# Patient Record
Sex: Male | Born: 1990 | Race: White | Hispanic: No | Marital: Single | Smoking: Current every day smoker
Health system: Southern US, Community
[De-identification: ages and names within clinical notes are randomized; demographics above are authoritative.]

## PROBLEM LIST (undated history)

## (undated) DIAGNOSIS — E119 Type 2 diabetes mellitus without complications: Secondary | ICD-10-CM

## (undated) DIAGNOSIS — L0291 Cutaneous abscess, unspecified: Secondary | ICD-10-CM

## (undated) HISTORY — PX: DENTAL SURGERY: SHX609

## (undated) HISTORY — PX: TONSILLECTOMY: SUR1361

---

## 2016-12-19 ENCOUNTER — Inpatient Hospital Stay (HOSPITAL_COMMUNITY)
Admission: EM | Admit: 2016-12-19 | Discharge: 2017-01-06 | DRG: 871 | Disposition: A | Discharge status: Skilled Nursing Facility | Payer: Self-pay | Attending: Internal Medicine | Admitting: Internal Medicine

## 2016-12-19 ENCOUNTER — Encounter (HOSPITAL_COMMUNITY): Payer: Self-pay | Admitting: Emergency Medicine

## 2016-12-19 DIAGNOSIS — E222 Syndrome of inappropriate secretion of antidiuretic hormone: Secondary | ICD-10-CM | POA: Diagnosis present

## 2016-12-19 DIAGNOSIS — D649 Anemia, unspecified: Secondary | ICD-10-CM | POA: Diagnosis present

## 2016-12-19 DIAGNOSIS — Z91030 Bee allergy status: Secondary | ICD-10-CM

## 2016-12-19 DIAGNOSIS — R52 Pain, unspecified: Secondary | ICD-10-CM

## 2016-12-19 DIAGNOSIS — R918 Other nonspecific abnormal finding of lung field: Secondary | ICD-10-CM | POA: Diagnosis present

## 2016-12-19 DIAGNOSIS — Z9689 Presence of other specified functional implants: Secondary | ICD-10-CM

## 2016-12-19 DIAGNOSIS — Z4682 Encounter for fitting and adjustment of non-vascular catheter: Secondary | ICD-10-CM

## 2016-12-19 DIAGNOSIS — Z91040 Latex allergy status: Secondary | ICD-10-CM

## 2016-12-19 DIAGNOSIS — A4102 Sepsis due to Methicillin resistant Staphylococcus aureus: Principal | ICD-10-CM

## 2016-12-19 DIAGNOSIS — F1990 Other psychoactive substance use, unspecified, uncomplicated: Secondary | ICD-10-CM | POA: Diagnosis present

## 2016-12-19 DIAGNOSIS — F17210 Nicotine dependence, cigarettes, uncomplicated: Secondary | ICD-10-CM | POA: Diagnosis present

## 2016-12-19 DIAGNOSIS — IMO0001 Reserved for inherently not codable concepts without codable children: Secondary | ICD-10-CM

## 2016-12-19 DIAGNOSIS — J90 Pleural effusion, not elsewhere classified: Secondary | ICD-10-CM | POA: Diagnosis present

## 2016-12-19 DIAGNOSIS — Z09 Encounter for follow-up examination after completed treatment for conditions other than malignant neoplasm: Secondary | ICD-10-CM

## 2016-12-19 DIAGNOSIS — I2690 Septic pulmonary embolism without acute cor pulmonale: Secondary | ICD-10-CM | POA: Diagnosis present

## 2016-12-19 DIAGNOSIS — F1910 Other psychoactive substance abuse, uncomplicated: Secondary | ICD-10-CM | POA: Diagnosis present

## 2016-12-19 DIAGNOSIS — E1110 Type 2 diabetes mellitus with ketoacidosis without coma: Secondary | ICD-10-CM | POA: Diagnosis present

## 2016-12-19 DIAGNOSIS — D638 Anemia in other chronic diseases classified elsewhere: Secondary | ICD-10-CM | POA: Diagnosis present

## 2016-12-19 DIAGNOSIS — Z6833 Body mass index (BMI) 33.0-33.9, adult: Secondary | ICD-10-CM

## 2016-12-19 DIAGNOSIS — E876 Hypokalemia: Secondary | ICD-10-CM | POA: Diagnosis not present

## 2016-12-19 DIAGNOSIS — Z888 Allergy status to other drugs, medicaments and biological substances status: Secondary | ICD-10-CM

## 2016-12-19 DIAGNOSIS — J189 Pneumonia, unspecified organism: Secondary | ICD-10-CM | POA: Diagnosis present

## 2016-12-19 DIAGNOSIS — L02214 Cutaneous abscess of groin: Secondary | ICD-10-CM | POA: Diagnosis present

## 2016-12-19 DIAGNOSIS — N151 Renal and perinephric abscess: Secondary | ICD-10-CM | POA: Diagnosis present

## 2016-12-19 DIAGNOSIS — J869 Pyothorax without fistula: Secondary | ICD-10-CM | POA: Diagnosis present

## 2016-12-19 DIAGNOSIS — Y95 Nosocomial condition: Secondary | ICD-10-CM | POA: Diagnosis present

## 2016-12-19 DIAGNOSIS — L02212 Cutaneous abscess of back [any part, except buttock]: Secondary | ICD-10-CM | POA: Diagnosis present

## 2016-12-19 DIAGNOSIS — M4624 Osteomyelitis of vertebra, thoracic region: Secondary | ICD-10-CM | POA: Diagnosis present

## 2016-12-19 DIAGNOSIS — B999 Unspecified infectious disease: Secondary | ICD-10-CM

## 2016-12-19 DIAGNOSIS — M4640 Discitis, unspecified, site unspecified: Secondary | ICD-10-CM | POA: Diagnosis present

## 2016-12-19 DIAGNOSIS — R509 Fever, unspecified: Secondary | ICD-10-CM

## 2016-12-19 DIAGNOSIS — Z79899 Other long term (current) drug therapy: Secondary | ICD-10-CM

## 2016-12-19 DIAGNOSIS — J9811 Atelectasis: Secondary | ICD-10-CM | POA: Diagnosis present

## 2016-12-19 DIAGNOSIS — N10 Acute tubulo-interstitial nephritis: Secondary | ICD-10-CM

## 2016-12-19 DIAGNOSIS — K5900 Constipation, unspecified: Secondary | ICD-10-CM | POA: Diagnosis not present

## 2016-12-19 DIAGNOSIS — Z794 Long term (current) use of insulin: Secondary | ICD-10-CM

## 2016-12-19 DIAGNOSIS — G061 Intraspinal abscess and granuloma: Secondary | ICD-10-CM | POA: Diagnosis present

## 2016-12-19 DIAGNOSIS — M549 Dorsalgia, unspecified: Secondary | ICD-10-CM

## 2016-12-19 DIAGNOSIS — A419 Sepsis, unspecified organism: Secondary | ICD-10-CM | POA: Diagnosis present

## 2016-12-19 HISTORY — DX: Cutaneous abscess, unspecified: L02.91

## 2016-12-19 HISTORY — DX: Type 2 diabetes mellitus without complications: E11.9

## 2016-12-19 MED ORDER — HYDROMORPHONE HCL 1 MG/ML IJ SOLN
1.0000 mg | Freq: Once | INTRAMUSCULAR | Status: AC
Start: 2016-12-20 — End: 2016-12-20
  Administered 2016-12-20: 1 mg via INTRAVENOUS
  Filled 2016-12-19: qty 1

## 2016-12-19 MED ORDER — SODIUM CHLORIDE 0.9 % IV SOLN: Freq: Once | INTRAVENOUS | Status: DC

## 2016-12-19 NOTE — ED Triage Notes (Signed)
BIB [facility] as tx from [facility]. Pt reports L sided back pain that has worsened over past 2 weeks with N/V, dec in appetite and trouble controlling sugar. Pt had 2 groin abscesses drained in OR (1 two days and and one this AM) Pt sent here for R/O epidural abscess, needs to have MRI done.

## 2016-12-19 NOTE — ED Provider Notes (Addendum)
[facility]-EMERGENCY DEPT Provider Note   CSN: [id number] Arrival date & time: 12/19/16  2313   By signing my name below, I, [name], attest that this documentation has been prepared under the direction and in the presence of Azalia Bilis, MD. Electronically signed, [name], ED Scribe. 12/19/16. 11:58 PM.   History   Chief Complaint Chief Complaint  Patient presents with  . Back Pain   The history is provided by the patient and medical records. No language interpreter was used.    Keith Riggs is a 26 y.o. male with h/o IDDM presenting to the Emergency Department concerning gradually worsening severe back pain x 2 weeks. Some L sided chest wall pain worse with coughing, loss of appetite x 2-3 days, chills and diaphoresis also noted. Pt allegedly had an abscess drained on the L thigh this morning and another drained on the R thigh 2 days ago. Concern has been expressed that his glucose levels continue to climb over the time that he has experienced loss of appetite. Pt taking 40 units of insulin and 500 mg metFormin regularly at home. He states he last used IV drugs 2 months ago. No known medication allergies. No SOB, vomiting, diarrhea, fever or headache noted. No other complaints at this time.    Pt sent from [facility] for MRI of the spine and admission. Pt given vancomycin and zosyn at [facility] 28[facility]  Past Medical History:  Diagnosis Date  . Abscess   . Diabetes mellitus without complication (HCC)     There are no active problems to display for this patient.   Past Surgical History:  Procedure Laterality Date  . DENTAL SURGERY    . TONSILLECTOMY         Home Medications    Prior to Admission medications   Medication Sig Start Date End Date Taking? Authorizing Provider  cephALEXin (KEFLEX) 500 MG capsule Take 500 mg by mouth 4 (four) times daily. 12/15/16  Yes [provider]  HYDROcodone-acetaminophen (NORCO/VICODIN) 5-325 MG tablet  Take 1 tablet by mouth every 4 (four) hours as needed. for pain 12/17/16  Yes [provider]  Insulin Glargine (TOUJEO MAX SOLOSTAR) 300 UNIT/ML SOPN Inject 40 Units into the skin daily.   Yes [provider]  metFORMIN (GLUCOPHAGE) 500 MG tablet Take 500 mg by mouth 2 (two) times daily with a meal.   Yes [provider]    Family History No family history on file.  Social History Social History  Substance Use Topics  . Smoking status: Current Every Day Smoker    Types: Cigarettes  . Smokeless tobacco: Not on file  . Alcohol use Yes     Allergies   Bee venom; Voltaren [diclofenac sodium]; and Latex   Review of Systems Review of Systems  Constitutional: Positive for appetite change, chills and diaphoresis. Negative for fever.  Respiratory: Negative for shortness of breath.   Gastrointestinal: Negative for diarrhea, nausea and vomiting.  Musculoskeletal: Positive for back pain.  Neurological: Negative for headaches.  All other systems reviewed and are negative.    Physical Exam Updated Vital Signs BP 124/70 (BP Location: Left Arm)   Pulse 94   Temp 98 F (36.7 C) (Oral)   Resp (!) 28   Ht 5\' 11"  (1.803 m)   Wt 237 lb (107.5 kg)   SpO2 99%   BMI 33.05 kg/m   Physical Exam  Constitutional: He is oriented to person, place, and time. He appears well-developed and well-nourished.  HENT:  Head: Normocephalic and atraumatic.  Eyes: EOM are normal.  Neck: Normal range of motion.  Cardiovascular: Normal rate, regular rhythm and normal heart sounds.   Pulmonary/Chest: Effort normal and breath sounds normal. No respiratory distress.  Abdominal: Soft. He exhibits no distension. There is no tenderness.  Musculoskeletal: Normal range of motion.  Midthoracic swelling and skin discoloration without drainage.  Palpable mass.  Normal strength in lower extremities  Neurological: He is alert and oriented to person, place, and time.  Skin: Skin is warm  and dry.  Psychiatric: He has a normal mood and affect. Judgment normal.  Nursing note and vitals reviewed.    ED Treatments / Results  DIAGNOSTIC STUDIES: Oxygen Saturation is 99% on RA, NL by my interpretation.    COORDINATION OF CARE: 11:51 PM-Discussed next steps with pt. Pt verbalized understanding and is agreeable with the plan. Will review imaging and prepare for admission.   Labs (all labs ordered are listed, but only abnormal results are displayed) Labs Reviewed - No data to display  EKG  EKG Interpretation None       Radiology No results found.  ++++++++++++++++++++++++++++++++++++++++++++++++  Procedures .Critical Care Performed by: Azalia BilisAMPOS, Conroy Goracke Authorized by: Azalia BilisAMPOS, Aidin Doane    Total critical care time: 33 minutes Critical care time was exclusive of separately billable procedures and treating other patients. Critical care was necessary to treat or prevent imminent or life-threatening deterioration. Critical care was time spent personally by me on the following activities: development of treatment plan with patient and/or surrogate as well as nursing, discussions with consultants, evaluation of patient's response to treatment, examination of patient, obtaining history from patient or surrogate, ordering and performing treatments and interventions, ordering and review of laboratory studies, ordering and review of radiographic studies, pulse oximetry and re-evaluation of patient's condition.  +++++++++++++++++++++++++++++++++++++++++++++++++++  Medications Ordered in ED Medications  0.9 %  sodium chloride infusion ( Intravenous Restarted 12/20/16 0507)  HYDROmorphone (DILAUDID) injection 0.5-1 mg (1 mg Intravenous Given 12/20/16 0213)  vancomycin (VANCOCIN) IVPB 1000 mg/200 mL premix (0 mg Intravenous Stopped 12/20/16 0316)  piperacillin-tazobactam (ZOSYN) IVPB 3.375 g (3.375 g Intravenous New Bag/Given 12/20/16 0527)  0.9 %  sodium chloride infusion ( Intravenous  Stopped 12/20/16 0325)  acetaminophen (TYLENOL) tablet 650 mg (not administered)  ondansetron (ZOFRAN) injection 4 mg (not administered)  insulin glargine (LANTUS) injection 20 Units (not administered)  insulin aspart (novoLOG) injection 0-9 Units (not administered)  HYDROmorphone (DILAUDID) injection 1 mg (1 mg Intravenous Given 12/20/16 0038)  gadobenate dimeglumine (MULTIHANCE) injection 20 mL (20 mLs Intravenous Contrast Given 12/20/16 0445)  HYDROmorphone (DILAUDID) injection 0.5 mg (0.5 mg Intravenous Given 12/20/16 0520)     Initial Impression / Assessment and Plan / ED Course  I have reviewed the triage vital signs and the nursing notes.  Pertinent labs & imaging results that were available during my care of the patient were reviewed by me and considered in my medical decision making (see chart for details).     Outside records reviewed.  White count 28,000.  Patient with pneumonia on chest x-ray.  Given broad-spectrum antibiotics including Bactrim and Zosyn.  Blood cultures obtained.  Repeat CBG in repeat blood cultures now.  Nothing by mouth.  Patient will undergo thoracic MRI.  We will plan to admit the patient to the hospitalist service.  Case discussed with Dr. Julian ReilGardner  5:29 AM Patient does have an epidural abscess.  I discussed the case with neurosurgery will evaluate the patient at bedside.  Patient will remain nothing  by mouth at this time.  Patient also does have paraspinal abscesses and a questionable empyema.  He'll undergo CT chest abdomen and pelvis for further evaluation.  Dr. Julian Reil was also made aware as he is the admitting hospitalist.  Final Clinical Impressions(s) / ED Diagnoses   Final diagnoses:  Back pain  Fever  Sepsis, due to unspecified organism (HCC)  HCAP (healthcare-associated pneumonia)    New Prescriptions New Prescriptions   No medications on file   I personally performed the services described in this documentation, which was scribed in my  presence. The recorded information has been reviewed and is accurate.        Azalia Bilis, MD 12/20/16 Ivor Reining    Azalia Bilis, MD 12/20/16 [phone]

## 2016-12-20 ENCOUNTER — Inpatient Hospital Stay (HOSPITAL_COMMUNITY): Payer: Self-pay

## 2016-12-20 ENCOUNTER — Encounter (HOSPITAL_COMMUNITY): Payer: Self-pay

## 2016-12-20 ENCOUNTER — Inpatient Hospital Stay (HOSPITAL_COMMUNITY): Payer: Medicaid - Out of State

## 2016-12-20 DIAGNOSIS — F1910 Other psychoactive substance abuse, uncomplicated: Secondary | ICD-10-CM

## 2016-12-20 DIAGNOSIS — A419 Sepsis, unspecified organism: Secondary | ICD-10-CM

## 2016-12-20 DIAGNOSIS — D649 Anemia, unspecified: Secondary | ICD-10-CM | POA: Diagnosis present

## 2016-12-20 DIAGNOSIS — Z794 Long term (current) use of insulin: Secondary | ICD-10-CM

## 2016-12-20 DIAGNOSIS — N10 Acute tubulo-interstitial nephritis: Secondary | ICD-10-CM

## 2016-12-20 DIAGNOSIS — E119 Type 2 diabetes mellitus without complications: Secondary | ICD-10-CM

## 2016-12-20 DIAGNOSIS — J189 Pneumonia, unspecified organism: Secondary | ICD-10-CM

## 2016-12-20 DIAGNOSIS — L02214 Cutaneous abscess of groin: Secondary | ICD-10-CM

## 2016-12-20 DIAGNOSIS — F1990 Other psychoactive substance use, unspecified, uncomplicated: Secondary | ICD-10-CM

## 2016-12-20 DIAGNOSIS — J90 Pleural effusion, not elsewhere classified: Secondary | ICD-10-CM

## 2016-12-20 DIAGNOSIS — L02212 Cutaneous abscess of back [any part, except buttock]: Secondary | ICD-10-CM | POA: Diagnosis present

## 2016-12-20 DIAGNOSIS — M4624 Osteomyelitis of vertebra, thoracic region: Secondary | ICD-10-CM

## 2016-12-20 DIAGNOSIS — G061 Intraspinal abscess and granuloma: Secondary | ICD-10-CM

## 2016-12-20 DIAGNOSIS — Z91040 Latex allergy status: Secondary | ICD-10-CM

## 2016-12-20 DIAGNOSIS — J181 Lobar pneumonia, unspecified organism: Secondary | ICD-10-CM

## 2016-12-20 DIAGNOSIS — F17210 Nicotine dependence, cigarettes, uncomplicated: Secondary | ICD-10-CM | POA: Diagnosis present

## 2016-12-20 DIAGNOSIS — R7881 Bacteremia: Secondary | ICD-10-CM

## 2016-12-20 DIAGNOSIS — M546 Pain in thoracic spine: Secondary | ICD-10-CM

## 2016-12-20 DIAGNOSIS — R918 Other nonspecific abnormal finding of lung field: Secondary | ICD-10-CM | POA: Diagnosis present

## 2016-12-20 DIAGNOSIS — Z888 Allergy status to other drugs, medicaments and biological substances status: Secondary | ICD-10-CM

## 2016-12-20 DIAGNOSIS — E1110 Type 2 diabetes mellitus with ketoacidosis without coma: Secondary | ICD-10-CM

## 2016-12-20 DIAGNOSIS — IMO0001 Reserved for inherently not codable concepts without codable children: Secondary | ICD-10-CM

## 2016-12-20 DIAGNOSIS — Z91030 Bee allergy status: Secondary | ICD-10-CM

## 2016-12-20 HISTORY — DX: Cutaneous abscess of groin: L02.214

## 2016-12-20 HISTORY — DX: Other psychoactive substance abuse, uncomplicated: F19.10

## 2016-12-20 HISTORY — DX: Osteomyelitis of vertebra, thoracic region: M46.24

## 2016-12-20 HISTORY — DX: Intraspinal abscess and granuloma: G06.1

## 2016-12-20 LAB — BASIC METABOLIC PANEL
ANION GAP: 12 (ref 5–15)
Anion gap: 10 (ref 5–15)
Anion gap: 10 (ref 5–15)
Anion gap: 9 (ref 5–15)
BUN: 5 mg/dL — AB (ref 6–20)
BUN: 7 mg/dL (ref 6–20)
BUN: 8 mg/dL (ref 6–20)
CALCIUM: 7.7 mg/dL — AB (ref 8.9–10.3)
CHLORIDE: 95 mmol/L — AB (ref 101–111)
CO2: 20 mmol/L — AB (ref 22–32)
CO2: 20 mmol/L — AB (ref 22–32)
CO2: 21 mmol/L — ABNORMAL LOW (ref 22–32)
CO2: 25 mmol/L (ref 22–32)
CREATININE: 0.55 mg/dL — AB (ref 0.61–1.24)
CREATININE: 0.56 mg/dL — AB (ref 0.61–1.24)
CREATININE: 0.63 mg/dL (ref 0.61–1.24)
Calcium: 7.7 mg/dL — ABNORMAL LOW (ref 8.9–10.3)
Calcium: 7.8 mg/dL — ABNORMAL LOW (ref 8.9–10.3)
Calcium: 7.8 mg/dL — ABNORMAL LOW (ref 8.9–10.3)
Chloride: 95 mmol/L — ABNORMAL LOW (ref 101–111)
Chloride: 95 mmol/L — ABNORMAL LOW (ref 101–111)
Chloride: 93 mmol/L — ABNORMAL LOW (ref 101–111)
Creatinine, Ser: 0.64 mg/dL (ref 0.61–1.24)
GFR calc Af Amer: >60 mL/min (ref >60)
GFR calc Af Amer: >60 mL/min (ref >60)
GFR calc non Af Amer: >60 mL/min (ref >60)
GFR calc non Af Amer: >60 mL/min (ref >60)
GLUCOSE: 195 mg/dL — AB (ref 65–99)
GLUCOSE: 199 mg/dL — AB (ref 65–99)
Glucose, Bld: 191 mg/dL — ABNORMAL HIGH (ref 65–99)
Glucose, Bld: 250 mg/dL — ABNORMAL HIGH (ref 65–99)
POTASSIUM: 3.5 mmol/L (ref 3.5–5.1)
Potassium: 3.2 mmol/L — ABNORMAL LOW (ref 3.5–5.1)
Potassium: 3.6 mmol/L (ref 3.5–5.1)
Potassium: 3.6 mmol/L (ref 3.5–5.1)
SODIUM: 126 mmol/L — AB (ref 135–145)
SODIUM: 127 mmol/L — AB (ref 135–145)
Sodium: 125 mmol/L — ABNORMAL LOW (ref 135–145)
Sodium: 127 mmol/L — ABNORMAL LOW (ref 135–145)

## 2016-12-20 LAB — FERRITIN: FERRITIN: 1249 ng/mL — AB (ref 24–336)

## 2016-12-20 LAB — CBC
HEMATOCRIT: 31.6 % — AB (ref 39.0–52.0)
HEMOGLOBIN: 10.3 g/dL — AB (ref 13.0–17.0)
MCH: 26.8 pg (ref 26.0–34.0)
MCHC: 32.6 g/dL (ref 30.0–36.0)
MCV: 82.3 fL (ref 78.0–100.0)
Platelets: 529 10*3/uL — ABNORMAL HIGH (ref 150–400)
RBC: 3.84 MIL/uL — ABNORMAL LOW (ref 4.22–5.81)
RDW: 13.9 % (ref 11.5–15.5)
WBC: 26.5 10*3/uL — ABNORMAL HIGH (ref 4.0–10.5)

## 2016-12-20 LAB — ECHOCARDIOGRAM COMPLETE
Height: 71 in
Weight: 3792 oz

## 2016-12-20 LAB — CBG MONITORING, ED
GLUCOSE-CAPILLARY: 186 mg/dL — AB (ref 65–99)
GLUCOSE-CAPILLARY: 193 mg/dL — AB (ref 65–99)
Glucose-Capillary: 183 mg/dL — ABNORMAL HIGH (ref 65–99)
Glucose-Capillary: 175 mg/dL — ABNORMAL HIGH (ref 65–99)

## 2016-12-20 LAB — RETICULOCYTES
RBC.: 3.95 MIL/uL — ABNORMAL LOW (ref 4.22–5.81)
RETIC COUNT ABSOLUTE: 27.7 10*3/uL (ref 19.0–186.0)
RETIC CT PCT: 0.7 % (ref 0.4–3.1)

## 2016-12-20 LAB — GLUCOSE, CAPILLARY
Glucose-Capillary: 172 mg/dL — ABNORMAL HIGH (ref 65–99)
Glucose-Capillary: 222 mg/dL — ABNORMAL HIGH (ref 65–99)

## 2016-12-20 LAB — HIV ANTIBODY (ROUTINE TESTING W REFLEX): HIV SCREEN 4TH GENERATION: NONREACTIVE

## 2016-12-20 LAB — IRON AND TIBC
IRON: 17 ug/dL — AB (ref 45–182)
Saturation Ratios: 13 % — ABNORMAL LOW (ref 17.9–39.5)
TIBC: 132 ug/dL — ABNORMAL LOW (ref 250–450)
UIBC: 115 ug/dL

## 2016-12-20 LAB — STREP PNEUMONIAE URINARY ANTIGEN: STREP PNEUMO URINARY ANTIGEN: NEGATIVE

## 2016-12-20 LAB — PROTEIN, TOTAL: TOTAL PROTEIN: 5.9 g/dL — AB (ref 6.5–8.1)

## 2016-12-20 MED ORDER — ONDANSETRON HCL 4 MG/2ML IJ SOLN: 4.0000 mg | Freq: Four times a day (QID) | INTRAMUSCULAR | Status: DC | PRN

## 2016-12-20 MED ORDER — SODIUM CHLORIDE 0.9 % IV SOLN: INTRAVENOUS | Status: DC

## 2016-12-20 MED ORDER — INSULIN ASPART 100 UNIT/ML ~~LOC~~ SOLN
0.0000 [IU] | SUBCUTANEOUS | Status: DC
  Administered 2016-12-20 (×3): 2 [IU] via SUBCUTANEOUS
  Filled 2016-12-20 (×3): qty 1

## 2016-12-20 MED ORDER — PIPERACILLIN-TAZOBACTAM 3.375 G IVPB: 3.3750 g | Freq: Three times a day (TID) | INTRAVENOUS | Status: DC

## 2016-12-20 MED ORDER — FENTANYL CITRATE (PF) 100 MCG/2ML IJ SOLN
INTRAMUSCULAR | Status: AC
  Filled 2016-12-20: qty 4

## 2016-12-20 MED ORDER — MIDAZOLAM HCL 2 MG/2ML IJ SOLN
INTRAMUSCULAR | Status: AC | PRN
  Administered 2016-12-20 (×2): 1 mg via INTRAVENOUS

## 2016-12-20 MED ORDER — DEXTROSE-NACL 5-0.45 % IV SOLN: INTRAVENOUS | Status: DC

## 2016-12-20 MED ORDER — SODIUM CHLORIDE 0.9 % IV SOLN
INTRAVENOUS | Status: DC
  Filled 2016-12-20: qty 1

## 2016-12-20 MED ORDER — HYDROMORPHONE HCL 1 MG/ML IJ SOLN
0.5000 mg | INTRAMUSCULAR | Status: DC | PRN
  Administered 2016-12-20 – 2016-12-21 (×8): 1 mg via INTRAVENOUS
  Administered 2016-12-21: 0.5 mg via INTRAVENOUS
  Administered 2016-12-22: 1 mg via INTRAVENOUS
  Administered 2016-12-22: 0.5 mg via INTRAVENOUS
  Administered 2016-12-22 – 2016-12-23 (×6): 1 mg via INTRAVENOUS
  Administered 2016-12-24: 0.5 mg via INTRAVENOUS
  Administered 2016-12-24 (×3): 1 mg via INTRAVENOUS
  Administered 2016-12-24: 0.5 mg via INTRAVENOUS
  Administered 2016-12-25 (×2): 1 mg via INTRAVENOUS
  Filled 2016-12-20 (×4): qty 1
  Filled 2016-12-20: qty 0.5
  Filled 2016-12-20 (×8): qty 1
  Filled 2016-12-20 (×2): qty 0.5
  Filled 2016-12-20 (×3): qty 1
  Filled 2016-12-20: qty 0.5
  Filled 2016-12-20 (×6): qty 1

## 2016-12-20 MED ORDER — FENTANYL CITRATE (PF) 100 MCG/2ML IJ SOLN
INTRAMUSCULAR | Status: AC | PRN
  Administered 2016-12-20 (×2): 50 ug via INTRAVENOUS

## 2016-12-20 MED ORDER — SODIUM CHLORIDE 0.9 % IV SOLN
INTRAVENOUS | Status: DC
  Administered 2016-12-20 (×2): via INTRAVENOUS
  Administered 2016-12-20: 125 mL/h via INTRAVENOUS

## 2016-12-20 MED ORDER — IOPAMIDOL (ISOVUE-300) INJECTION 61%
INTRAVENOUS | Status: AC
Start: 2016-12-20 — End: 2016-12-20
  Administered 2016-12-20: 100 mL
  Filled 2016-12-20: qty 75

## 2016-12-20 MED ORDER — OXYCODONE HCL 5 MG PO TABS
5.0000 mg | ORAL_TABLET | ORAL | Status: DC | PRN
  Administered 2016-12-20 – 2016-12-21 (×3): 5 mg via ORAL
  Filled 2016-12-20 (×4): qty 1

## 2016-12-20 MED ORDER — INSULIN ASPART 100 UNIT/ML ~~LOC~~ SOLN: 0.0000 [IU] | SUBCUTANEOUS | Status: DC

## 2016-12-20 MED ORDER — SODIUM CHLORIDE 0.9 % IV SOLN
INTRAVENOUS | Status: AC
  Administered 2016-12-20: 02:00:00 via INTRAVENOUS

## 2016-12-20 MED ORDER — ACETAMINOPHEN 325 MG PO TABS
650.0000 mg | ORAL_TABLET | Freq: Four times a day (QID) | ORAL | Status: DC | PRN
  Administered 2016-12-20 – 2016-12-26 (×4): 650 mg via ORAL
  Filled 2016-12-20 (×4): qty 2

## 2016-12-20 MED ORDER — INSULIN ASPART 100 UNIT/ML ~~LOC~~ SOLN: 0.0000 [IU] | Freq: Three times a day (TID) | SUBCUTANEOUS | Status: DC

## 2016-12-20 MED ORDER — INSULIN GLARGINE 100 UNIT/ML ~~LOC~~ SOLN
20.0000 [IU] | Freq: Every day | SUBCUTANEOUS | Status: DC
  Administered 2016-12-20: 20 [IU] via SUBCUTANEOUS
  Filled 2016-12-20 (×3): qty 0.2

## 2016-12-20 MED ORDER — LIDOCAINE HCL (PF) 1 % IJ SOLN
INTRAMUSCULAR | Status: AC
  Filled 2016-12-20: qty 30

## 2016-12-20 MED ORDER — HYDROMORPHONE HCL 1 MG/ML IJ SOLN
0.5000 mg | Freq: Once | INTRAMUSCULAR | Status: AC
  Administered 2016-12-20: 0.5 mg via INTRAVENOUS
  Filled 2016-12-20: qty 0.5

## 2016-12-20 MED ORDER — VANCOMYCIN HCL IN DEXTROSE 1-5 GM/200ML-% IV SOLN
1000.0000 mg | Freq: Three times a day (TID) | INTRAVENOUS | Status: DC
  Administered 2016-12-20 – 2016-12-21 (×5): 1000 mg via INTRAVENOUS
  Filled 2016-12-20 (×6): qty 200

## 2016-12-20 MED ORDER — INSULIN GLARGINE 100 UNIT/ML ~~LOC~~ SOLN
25.0000 [IU] | Freq: Every day | SUBCUTANEOUS | Status: DC
  Filled 2016-12-20: qty 0.25

## 2016-12-20 MED ORDER — GADOBENATE DIMEGLUMINE 529 MG/ML IV SOLN
20.0000 mL | Freq: Once | INTRAVENOUS | Status: AC
  Administered 2016-12-20: 20 mL via INTRAVENOUS

## 2016-12-20 MED ORDER — DEXTROSE 5 % IV SOLN
2.0000 g | INTRAVENOUS | Status: DC
  Administered 2016-12-20 – 2016-12-22 (×3): 2 g via INTRAVENOUS
  Filled 2016-12-20 (×3): qty 2

## 2016-12-20 MED ORDER — MIDAZOLAM HCL 2 MG/2ML IJ SOLN
INTRAMUSCULAR | Status: AC
  Filled 2016-12-20: qty 4

## 2016-12-20 MED ORDER — INSULIN ASPART 100 UNIT/ML ~~LOC~~ SOLN
0.0000 [IU] | SUBCUTANEOUS | Status: DC
  Administered 2016-12-20: 3 [IU] via SUBCUTANEOUS
  Administered 2016-12-21 (×2): 5 [IU] via SUBCUTANEOUS
  Administered 2016-12-21: 3 [IU] via SUBCUTANEOUS
  Administered 2016-12-21: 5 [IU] via SUBCUTANEOUS
  Administered 2016-12-21 (×2): 8 [IU] via SUBCUTANEOUS
  Administered 2016-12-22: 5 [IU] via SUBCUTANEOUS
  Administered 2016-12-22: 8 [IU] via SUBCUTANEOUS
  Administered 2016-12-22: 5 [IU] via SUBCUTANEOUS
  Administered 2016-12-22 (×2): 3 [IU] via SUBCUTANEOUS
  Administered 2016-12-22 – 2016-12-23 (×2): 8 [IU] via SUBCUTANEOUS
  Administered 2016-12-23: 3 [IU] via SUBCUTANEOUS
  Administered 2016-12-23: 5 [IU] via SUBCUTANEOUS
  Administered 2016-12-23: 8 [IU] via SUBCUTANEOUS
  Administered 2016-12-23: 5 [IU] via SUBCUTANEOUS
  Administered 2016-12-23: 2 [IU] via SUBCUTANEOUS
  Administered 2016-12-24: 5 [IU] via SUBCUTANEOUS
  Administered 2016-12-24: 3 [IU] via SUBCUTANEOUS
  Administered 2016-12-25: 6 [IU] via SUBCUTANEOUS
  Administered 2016-12-25: 11 [IU] via SUBCUTANEOUS
  Administered 2016-12-25: 3 [IU] via SUBCUTANEOUS

## 2016-12-20 MED ORDER — PIPERACILLIN-TAZOBACTAM 3.375 G IVPB
3.3750 g | Freq: Three times a day (TID) | INTRAVENOUS | Status: DC
  Administered 2016-12-20: 3.375 g via INTRAVENOUS
  Filled 2016-12-20: qty 50

## 2016-12-20 NOTE — Consult Note (Signed)
[facility]    Date of Admission:  12/19/2016   Total days of antibiotics 4        Day 1 vancomycin        Day 1 piperacillin tazobactam               Reason for Consult: Widely disseminated infection with thoracic osteomyelitis, epidural abscess, cavitary lung nodules, probable pleural empyemas, and multiple soft tissue infection    Referring Provider: Dr. Calvert Cantor  Assessment: He has widely disseminated infection with thoracic osteomyelitis complicated by large epidural abscess causing cord compression. I suspect that he has right-sided endocarditis with septic pulmonary emboli. This is most likely due to staph aureus but I will continue broad coverage pending culture results. I will continue vancomycin for now. He does not need broader coverage for resistant gram-negative rods and anaerobes. I will narrow piperacillin tazobactam to ceftriaxone.   Plan: 1. Continue vancomycin 2. Change piperacillin tazobactam to ceftriaxone 3. Await results of blood cultures 4. Transthoracic echocardiogram   Principal Problem:   Osteomyelitis of thoracic spine (HCC) Active Problems:   Multiple lung nodules   Sepsis (HCC)   Abscess of back   Pleural effusion, bilateral   Abscess in epidural space of thoracic spine   Soft tissue abscess of inguinal region   DM2 (diabetes mellitus, type 2) (HCC)   IVDU (intravenous drug user)   DKA (diabetic ketoacidoses) (HCC)   Polysubstance abuse   Cigarette smoker   Normocytic anemia   . insulin aspart  0-9 Units Subcutaneous Q4H  . insulin glargine  20 Units Subcutaneous Daily    HPI: Keith Riggs is a 26 y.o. male with diabetes mellitus and a history of active intravenous drug use. He recently developed bilateral groin abscesses, fever, chills and mid back pain. His right groin abscess was incised and drained 3 days ago. I cannot locate any culture results. He was started on empiric cephalexin. He was seen  back in the ED at [facility] for wound check yesterday and released. He returned to the ED there because of progressively severe back pain and was transferred here. 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 There are also multiple paraspinous abscesses There are bilateral cavitary lung nodules and loculated pleural effusions left greater than right. Abdominal CT scan shows evidence of bilateral pyelonephritis, a right perinephric abscess and left buttock abscess. Blood cultures done yesterday at [facility] are negative so far.   Review of Systems: Review of Systems  Constitutional: Positive for chills, diaphoresis, fever and malaise/fatigue. Negative for weight loss.  HENT: Negative for congestion and sore throat.   Respiratory: Positive for cough and shortness of breath. Negative for sputum production.   Cardiovascular: Positive for chest pain.  Gastrointestinal: Positive for nausea. Negative for abdominal pain, diarrhea and vomiting.  Genitourinary: Negative for dysuria.  Musculoskeletal: Positive for back pain.  Skin: Negative for rash.  Neurological: Negative for sensory change, focal weakness and headaches.  Psychiatric/Behavioral: Positive for substance abuse.    Past Medical History:  Diagnosis Date  . Abscess   . Diabetes mellitus without complication [facility])     Social History  Substance Use Topics  . Smoking status: Current Every Day Smoker    Types: Cigarettes  . Smokeless tobacco: Not on file  . Alcohol use Yes    No family history on file. Allergies  Allergen Reactions  . Bee Venom Anaphylaxis  .  Voltaren [Diclofenac Sodium] Other (See Comments)    "Makes me feel not myself"  . Latex Rash    OBJECTIVE: Blood pressure (!) 126/59, pulse (!) 118, temperature 98.5 F (36.9 C), temperature source Oral, resp. rate (!) 30, height 5\' 11"  (1.803 m), weight 237 lb (107.5 kg), SpO2 91 %.  Physical Exam  Constitutional:  He is oriented to person, place, and time.  He is uncomfortable due to back pain. His grandmother is at the bedside.  HENT:  Mouth/Throat: No oropharyngeal exudate.  Eyes: Conjunctivae are normal.  Neck: Neck supple.  Cardiovascular: Regular rhythm.   No murmur heard. He is tachycardic.  Pulmonary/Chest: Effort normal and breath sounds normal. He has no wheezes. He has no rales.  Abdominal: Soft. He exhibits no distension. There is no tenderness.  Genitourinary:  Genitourinary Comments: Bilateral groin abscesses with bloody purulent drainage.  Musculoskeletal: Normal range of motion. He exhibits no edema or tenderness.  Abscess with swelling over her mid spine.  Neurological: He is alert and oriented to person, place, and time.  Skin: No rash noted.  Multiple tattoos.  Psychiatric: Mood and affect normal.    Lab Results Lab Results  Component Value Date   WBC 26.5 (H) 12/20/2016   HGB 10.3 (L) 12/20/2016   HCT 31.6 (L) 12/20/2016   MCV 82.3 12/20/2016   PLT 529 (H) 12/20/2016    Lab Results  Component Value Date   CREATININE 0.64 12/20/2016   BUN 5 (L) 12/20/2016   NA 127 (L) 12/20/2016   K 3.6 12/20/2016   CL 95 (L) 12/20/2016   CO2 20 (L) 12/20/2016   No results found for: ALT, AST, GGT, ALKPHOS, BILITOT   Microbiology: No results found for this or any previous visit (from the past 240 hour(s)).  Cliffton AstersJohn Mirra Basilio, MD [facility] [phone] pager   [phone] cell 12/20/2016, 12:01 PM

## 2016-12-20 NOTE — Progress Notes (Addendum)
PROGRESS NOTE    Keith Riggs   ZOX:[id number]  DOB: 06/05/1991  DOA: 12/19/2016 PCP: System, Pcp Not In   Brief Narrative:  26 y/o with IDDM, past history of heroin abuse, cocaine abuse who get random drug tests 2 x a month. He is a transfer from [facility] ER. History obtained from Grandmother and father whom he lives with.  He was seen there for a right groin abscess on Wed which was I and D'd. He was seen again on Friday for a packing of the abscess and was found to have a left groin abscess with was I and D'd. He returned to there ER in the after noon because he thought his packing had fallen out. At that time, he complained of back pain and was sent to [facility] for and MRI. This showed thoracic epidural abscesses causing severe spinal cord compression, paraspinal myositis and abscesses, T6 osteo and b/l pleural effusions.   CT chest abd pelvis: 1. Multiple cavitary nodules in both lungs, compatible with septic emboli. 2. Moderate-sized multiloculated left pleural effusion. The multiple loculations are compatible with empyema formation. 3. Previously described epidural and left paraspinal abscesses. 4. Bilateral lung atelectasis, greater on the left. 5. Soft tissue gas in the inferior, medial left buttock region, compatible with the patient's known groin abscesses. 6. Evidence of bilateral pyelonephritis with a small right perinephric abscess or small flattened exophytic cyst. 7. Minimal mediastinal adenopathy, most likely reactive.    Subjective: Complains of being thirsty and having some back pain. Mainly focused on wanting to eat and drink and poor historian.  No other complaints.   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  - CT surgery consulted- not a VATS candidate at this time- recommended IR consult for pigtail catheter- IR consulted and drain placed - ID consulted>> Vanc and Rocephin per ID - stat 2 D ECHO negative for vegetation   Active Problems:  Hyponatremia - cont normal saline infusion    DM2 (diabetes mellitus, type 2) - cont Lantus and SSI    IVDU (intravenous drug user) - has used IV Cocaine and Heroin in the past - states he has not used in 2-3 months - grandmother states he get random drug tests 2 x month - check UDS   Cigarette smoker - Nicotine patch    Normocytic anemia - anemia panel in AM   DVT prophylaxis: holding anticoagulation for possible neursurgery Code Status: Full code Family Communication: father and grandmother Disposition Plan: SDU Consultants:   ID  CT surgery  Neurosurgery  IR Procedures:   CT guided left chest tube Antimicrobials:  Anti-infectives    Start     Dose/Rate Route Frequency Ordered Stop   12/20/16 1300  piperacillin-tazobactam (ZOSYN) IVPB 3.375 g  Status:  Discontinued     3.375 g 12.5 mL/hr over 240 Minutes Intravenous Every 8 hours 12/20/16 1109 12/20/16 1214   12/20/16 1300  cefTRIAXone (ROCEPHIN) 2 g in dextrose 5 %  50 mL IVPB     2 g 100 mL/hr over 30 Minutes Intravenous Every 24 hours 12/20/16 1214     12/20/16 0200  vancomycin (VANCOCIN) IVPB 1000 mg/200 mL premix     1,000 mg 200 mL/hr over 60 Minutes Intravenous Every 8 hours 12/20/16 0024     12/20/16 0200  piperacillin-tazobactam (ZOSYN) IVPB 3.375 g  Status:  Discontinued     3.375 g 12.5 mL/hr over 240 Minutes Intravenous Every 8 hours 12/20/16 0024 12/20/16  1109       Objective: Vitals:   12/20/16 1145 12/20/16 1200 12/20/16 1215 12/20/16 1230  BP: 138/69 (!) 142/85 136/78 124/68  Pulse: (!) 117 (!) 115 (!) 120 (!) 120  Resp: (!) 37 (!) 37 (!) 38 (!) 28  Temp:      TempSrc:      SpO2: 93% 93% 93% 92%  Weight:      Height:        Intake/Output Summary (Last 24 hours) at 12/20/16 1302 Last data filed at 12/20/16 0326  Gross per 24 hour  Intake             2400 ml  Output              900 ml  Net             1500 ml   Filed Weights   12/19/16 2324  Weight: 107.5 kg (237 lb)    Examination: General exam: Appears comfortable  HEENT: PERRLA, oral mucosa moist, no sclera icterus or thrush Respiratory system: Clear to auscultation. RR in mid 20s on my exam.  Cardiovascular system: S1 & S2 heard, RRR.  No murmurs  Gastrointestinal system: Abdomen soft, non-tender, nondistended. Normal bowel sound. No organomegaly Central nervous system: Alert and oriented. No focal neurological deficits. Extremities: No cyanosis, clubbing or edema Skin: No rashes or ulcers Psychiatry:  Mood & affect appropriate.     Data Reviewed: I have personally reviewed following labs and imaging studies  CBC:  Recent Labs Lab 12/20/16 0011  WBC 26.5*  HGB 10.3*  HCT 31.6*  MCV 82.3  PLT 529*   Basic Metabolic Panel:  Recent Labs Lab 12/20/16 0011 12/20/16 0043 12/20/16 0909  NA 125* 126* 127*  K 3.6 3.5 3.6  CL 95* 95* 95*  CO2 20* 21* 20*  GLUCOSE 199* 191* 195*  BUN 7 8 5*  CREATININE 0.56* 0.63 0.64  CALCIUM 7.7* 7.8* 7.7*   GFR: Estimated Creatinine Clearance: 171.5 mL/min (by C-G formula based on SCr of 0.64 mg/dL). Liver Function Tests: No results for input(s): AST, ALT, ALKPHOS, BILITOT, PROT, ALBUMIN in the last 168 hours. No results for input(s): LIPASE, AMYLASE in the last 168 hours. No results for input(s): AMMONIA in the last 168 hours. Coagulation Profile: No results for input(s): INR, PROTIME in the last 168  hours. Cardiac Enzymes: No results for input(s): CKTOTAL, CKMB, CKMBINDEX, TROPONINI in the last 168 hours. BNP (last 3 results) No results for input(s): PROBNP in the last 8760 hours. HbA1C: No results for input(s): HGBA1C in the last 72 hours. CBG:  Recent Labs Lab 12/20/16 0051 12/20/16 0532 12/20/16 0858 12/20/16 1209  GLUCAP 193* 186* 183* 175*   Lipid Profile: No results for input(s): CHOL, HDL, LDLCALC, TRIG, CHOLHDL, LDLDIRECT in the last 72 hours. Thyroid Function Tests: No results for input(s): TSH, T4TOTAL, FREET4, T3FREE, THYROIDAB in the last 72 hours. Anemia Panel: No results for input(s): VITAMINB12, FOLATE, FERRITIN, TIBC, IRON, RETICCTPCT in the  last 72 hours. Urine analysis: No results found for: COLORURINE, APPEARANCEUR, LABSPEC, PHURINE, GLUCOSEU, HGBUR, BILIRUBINUR, KETONESUR, PROTEINUR, UROBILINOGEN, NITRITE, LEUKOCYTESUR Sepsis Labs: @LABRCNTIP (procalcitonin:4,lacticidven:4) )No results found for this or any previous visit (from the past 240 hour(s)).       Radiology Studies: Ct Chest W Contrast  Result Date: 12/20/2016 CLINICAL DATA:  Worsening left back pain over the past 2 weeks with nausea and vomiting. Thoracic spine MR earlier today demonstrating multifocal epidural abscess with paraspinal myositis and paraspinal muscle abscesses and bilateral pleural empyema with probable pneumonia and possible lung abscesses. Chest CT was recommended. 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 EXAM: CT CHEST, ABDOMEN, AND PELVIS WITH CONTRAST TECHNIQUE: Multidetector CT imaging of the chest, abdomen and pelvis was performed following the standard protocol during bolus administration of intravenous contrast. CONTRAST:  ISOVUE-300 IOPAMIDOL (ISOVUE-300) INJECTION 61% COMPARISON:  Thoracic spine MR obtained earlier today. FINDINGS: CT CHEST FINDINGS Cardiovascular: No significant vascular findings. Normal heart size. No pericardial effusion.  Mediastinum/Nodes: Prominent AP window lymph node with a short axis diameter of 11 mm on image number 27 of series 3. Mildly prominent precarinal node with a short axis diameter of 8 mm on image number 46 of series 3. Lungs/Pleura: Large number of bilateral lung nodules, most numerous on the right. Some of these demonstrate central cavitation. The largest on the left is at the medial left lung apex, measuring 1.7 cm in maximum diameter on image number 39 of series 4. The largest on the right is in the posterior aspect of the right upper lobe, with a maximum diameter of 1.6 cm on image number 53 of series 4. Small right pleural effusion and mild associated right lower lobe atelectasis. Moderate-sized multiloculated left pleural effusion and moderate compressive and dependent atelectasis. Musculoskeletal: Previously described left paraspinal muscle abscess. This is largest on image number 32 of series 3, measuring 3.9 x 2.3 cm on that image. There is a more discrete abscess with rim enhancement on the left on image number 36 of series 3, measuring 2.0 x 1.4 cm. The previously demonstrated multifocal epidural abscess is better visualized on the MR images. No bone destruction seen. Mild thoracic and lower cervical spine degenerative changes. CT ABDOMEN PELVIS FINDINGS Hepatobiliary: No focal liver abnormality is seen. No gallstones, gallbladder wall thickening, or biliary dilatation. Pancreas: Unremarkable. No pancreatic ductal dilatation or surrounding inflammatory changes. Spleen: Normal in size without focal abnormality. Adrenals/Urinary Tract: Both kidneys are mildly enlarged with poorly defined areas low density. There is a very small medial right perinephric fluid collection measuring 1.9 x 1.1 cm on image number 76 series 3. Minimal bilateral perinephric soft tissue stranding. Unremarkable ureters, urinary bladder and adrenal glands. No hydronephrosis. Stomach/Bowel: Stomach is within normal limits. Appendix  appears normal. No evidence of bowel wall thickening, distention, or inflammatory changes. Vascular/Lymphatic: No significant vascular findings are present. No enlarged abdominal or pelvic lymph nodes. Reproductive: Prostate is unremarkable. Other: Very small bilateral inguinal hernias containing fat. Musculoskeletal: The left paraspinal muscle abscesses extend to the level of the upper abdomen. The largest portion of the abscess at that level measures 4.3 x 2.7 cm on image number 58 of series 3. No bone destruction is seen. An L1 vertebral body hemangioma is incidentally noted. There is soft tissue gas in the inferior, medial left buttock region subcutaneous fat on the most inferior images with no fluid collection seen on those images. IMPRESSION: 1. Multiple cavitary nodules in both lungs, compatible with septic emboli. 2. Moderate-sized  multiloculated left pleural effusion. The multiple loculations are compatible with empyema formation. 3. Previously described epidural and left paraspinal abscesses. 4. Bilateral lung atelectasis, greater on the left. 5. Soft tissue gas in the inferior, medial left buttock region, compatible with the patient's known groin abscesses. 6. Evidence of bilateral pyelonephritis with a small right perinephric abscess or small flattened exophytic cyst. 7. Minimal mediastinal adenopathy, most likely reactive. Electronically Signed   By: Beckie Salts M.D.   On: 12/20/2016 09:11   Mr Thoracic Spine W Wo Contrast  Result Date: 12/20/2016 CLINICAL DATA:  Severe mid back pain for 2 weeks. History of diabetes , intravenous drug use and recent groin abscess. EXAM: MRI THORACIC WITHOUT AND WITH CONTRAST TECHNIQUE: Multiplanar and multiecho pulse sequences of the thoracic spine were obtained without and with intravenous contrast. CONTRAST:  20mL MULTIHANCE GADOBENATE DIMEGLUMINE 529 MG/ML IV SOLN COMPARISON:  Thoracic spine radiograph December 15, 2016 FINDINGS: Generally poor signal to noise ratio  due to habitus and motion. The axial sequences of the lower thoracic spine are nondiagnostic. ALIGNMENT: Straightened thoracic kyphosis. No malalignment. VERTEBRAE/DISCS: Vertebral bodies are intact. Intervertebral discs morphology and signal are normal. Faintly increased STIR signal and enhancement T6 vertebral body. No abnormal disc enhancement. CORD: 1.7 x 1.3 x 7.8 cm (transverse by AP by CC) ventral epidural abscess from T4-5 through T8-9 resulting in severe cord compression T5-6 through T7 nondiagnostic examination for spinal cord edema. No syrinx. LEFT ventral epidural abscess from T2 to T5-6, 7.1 cm in cranial caudad dimension resulting in mild cord compression. PREVERTEBRAL AND PARASPINAL SOFT TISSUES: Partially imaged lobulated, potentially loculated LEFT pleural effusion. Patchy LEFT lung consolidation. T2 bright signal within the bilateral paraspinal muscles compatible with mild bursitis. Complex 4.5 x 2.5 x 13.8 cm LEFT paraspinal muscle abscess from T5 through T12. 0.8 x 0.8 x 8.3 cm RIGHT paraspinal muscle abscess at the level of the epidural collection. Prevertebral subpleural abscesses bilaterally measuring to at least 1.4 x 2.4 cm on the LEFT. IMPRESSION: 1. Limited examination, nondiagnostic evaluation through the lower thoracic spine. 2. 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 No syrinx. 3. 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 4. Paraspinal myositis with paraspinal muscle abscesses, LEFT greater than RIGHT. 5. Bilateral pleural empyema and, probable pneumonia. Lung abscess is possible. Recommend CT chest with contrast. Critical Value/emergent results were called by telephone at the time of interpretation on 12/20/2016 at 5:21 am to Dr. Azalia Bilis, ED, who verbally acknowledged these results. Electronically Signed   By: Awilda Metro M.D.   On: 12/20/2016 05:25   Ct Abdomen Pelvis W  Contrast  Result Date: 12/20/2016 CLINICAL DATA:  Worsening left back pain over the past 2 weeks with nausea and vomiting. Thoracic spine MR earlier today demonstrating multifocal epidural abscess with paraspinal myositis and paraspinal muscle abscesses and bilateral pleural empyema with probable pneumonia and possible lung abscesses. Chest CT was recommended. 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 EXAM: CT CHEST, ABDOMEN, AND PELVIS WITH CONTRAST TECHNIQUE: Multidetector CT imaging of the chest, abdomen and pelvis was performed following the standard protocol during bolus administration of intravenous contrast. CONTRAST:  ISOVUE-300 IOPAMIDOL (ISOVUE-300) INJECTION 61% COMPARISON:  Thoracic spine MR obtained earlier today. FINDINGS: CT CHEST FINDINGS Cardiovascular: No significant vascular findings. Normal heart size. No pericardial effusion. Mediastinum/Nodes: Prominent AP window lymph node with a short axis diameter of 11 mm on image number 27  of series 3. Mildly prominent precarinal node with a short axis diameter of 8 mm on image number 46 of series 3. Lungs/Pleura: Large number of bilateral lung nodules, most numerous on the right. Some of these demonstrate central cavitation. The largest on the left is at the medial left lung apex, measuring 1.7 cm in maximum diameter on image number 39 of series 4. The largest on the right is in the posterior aspect of the right upper lobe, with a maximum diameter of 1.6 cm on image number 53 of series 4. Small right pleural effusion and mild associated right lower lobe atelectasis. Moderate-sized multiloculated left pleural effusion and moderate compressive and dependent atelectasis. Musculoskeletal: Previously described left paraspinal muscle abscess. This is largest on image number 32 of series 3, measuring 3.9 x 2.3 cm on that image. There is a more discrete abscess with rim enhancement on the left on image number 36 of series 3, measuring  2.0 x 1.4 cm. The previously demonstrated multifocal epidural abscess is better visualized on the MR images. No bone destruction seen. Mild thoracic and lower cervical spine degenerative changes. CT ABDOMEN PELVIS FINDINGS Hepatobiliary: No focal liver abnormality is seen. No gallstones, gallbladder wall thickening, or biliary dilatation. Pancreas: Unremarkable. No pancreatic ductal dilatation or surrounding inflammatory changes. Spleen: Normal in size without focal abnormality. Adrenals/Urinary Tract: Both kidneys are mildly enlarged with poorly defined areas low density. There is a very small medial right perinephric fluid collection measuring 1.9 x 1.1 cm on image number 76 series 3. Minimal bilateral perinephric soft tissue stranding. Unremarkable ureters, urinary bladder and adrenal glands. No hydronephrosis. Stomach/Bowel: Stomach is within normal limits. Appendix appears normal. No evidence of bowel wall thickening, distention, or inflammatory changes. Vascular/Lymphatic: No significant vascular findings are present. No enlarged abdominal or pelvic lymph nodes. Reproductive: Prostate is unremarkable. Other: Very small bilateral inguinal hernias containing fat. Musculoskeletal: The left paraspinal muscle abscesses extend to the level of the upper abdomen. The largest portion of the abscess at that level measures 4.3 x 2.7 cm on image number 58 of series 3. No bone destruction is seen. An L1 vertebral body hemangioma is incidentally noted. There is soft tissue gas in the inferior, medial left buttock region subcutaneous fat on the most inferior images with no fluid collection seen on those images. IMPRESSION: 1. Multiple cavitary nodules in both lungs, compatible with septic emboli. 2. Moderate-sized multiloculated left pleural effusion. The multiple loculations are compatible with empyema formation. 3. Previously described epidural and left paraspinal abscesses. 4. Bilateral lung atelectasis, greater on the  left. 5. Soft tissue gas in the inferior, medial left buttock region, compatible with the patient's known groin abscesses. 6. Evidence of bilateral pyelonephritis with a small right perinephric abscess or small flattened exophytic cyst. 7. Minimal mediastinal adenopathy, most likely reactive. Electronically Signed   By: Beckie Salts M.D.   On: 12/20/2016 09:11      Scheduled Meds: . insulin aspart  0-9 Units Subcutaneous Q4H  . insulin glargine  20 Units Subcutaneous Daily   Continuous Infusions: . sodium chloride 125 mL/hr (12/20/16 1254)  . cefTRIAXone (ROCEPHIN)  IV 2 g (12/20/16 1300)  . vancomycin Stopped (12/20/16 1153)     LOS: 0 days    Time spent in minutes: 60 min at minimal in intervals from 7:30 AM until now 5: 25 PM - greater than 50 % of time spent speaking with patient, father grandmother, multiple nurses, the physician who admitting him, reviewing chart from [facility], calling consults and  following up imaging and labs.     Calvert Cantor, MD [facility] Pager: [email or web address] Password [email or web address] 12/20/2016, 1:02 PM

## 2016-12-20 NOTE — H&P (Signed)
History and Physical    Keith Riggs ZOX:[id number] DOB: 12/11/1990 DOA: 12/19/2016  PCP: No primary care provider on file.  Patient coming from: [facility]  I have personally briefly reviewed patient's old medical records [facility]  Chief Complaint: Back pain  HPI: Keith Riggs is a 26 y.o. male with medical history significant of IDDM, IVDU, cocaine most recently has done heroin in past.  Patient recently had 2 groin abscesses drained in hospital (one 2 days ago and one yesterday).  He also has been having severe back pain across mid back for the past 2 weeks.  Worsening.  Also L sided chest pain with cough.  Loss of appetite x2-3 days.  States last IVDU was 2 months ago.   ED Course: [facility] he is septic with WBC 28k, initial HR 139, Tm 39.x, CXR shows LLL PNA, possibly LUL PNA too, has firm tender knot / abscess on back, [facility].  BGL in the 200s, but patient has >80 keytones in urine and anion gap of 20.  Bicarb is 20, and Lactate is only 1.x.  Given zosyn, vanc, and [facility].   Review of Systems: As per HPI otherwise 10 point review of systems negative.   Past Medical History:  Diagnosis Date  . Abscess   . Diabetes mellitus without complication [facility](HCC)     Past Surgical History:  Procedure Laterality Date  . DENTAL SURGERY    . TONSILLECTOMY       reports that he has been smoking Cigarettes.  He does not have any smokeless tobacco history on file. He reports that he drinks alcohol. He reports that he uses drugs, including IV.  Allergies  Allergen Reactions  . Bee Venom Anaphylaxis  . Voltaren [Diclofenac Sodium] Other (See Comments)    "Makes me feel not myself"  . Latex Rash    No family history on file.   Prior to Admission medications   Medication Sig Start Date End Date Taking? Authorizing Provider  cephALEXin (KEFLEX) 500 MG capsule Take 500 mg by mouth 4 (four) times daily. 12/15/16  Yes [provider]  HYDROcodone-acetaminophen (NORCO/VICODIN) 5-325 MG tablet Take 1 tablet by mouth every 4 (four) hours as needed. for pain 12/17/16  Yes [provider]  Insulin Glargine (TOUJEO MAX SOLOSTAR) 300 UNIT/ML SOPN Inject 40 Units into the skin daily.   Yes [provider]  metFORMIN (GLUCOPHAGE) 500 MG tablet Take 500 mg by mouth 2 (two) times daily with a meal.   Yes [provider]    Physical Exam: Vitals:   12/19/16 2330 12/19/16 2345 12/20/16 0000 12/20/16 0015  BP: 136/80 128/64 136/80 (!) 145/78  Pulse: 95 95 97 95  Resp: (!) 32 (!) 32 (!) 28 (!) 23  Temp:      TempSrc:      SpO2: 99% 99% 98% 99%  Weight:      Height:        Constitutional: NAD, calm, comfortable Eyes: PERRL, lids and conjunctivae normal ENMT: Mucous membranes are moist. Posterior pharynx clear of any exudate or lesions.Normal dentition.  Neck: normal, supple, no masses, no thyromegaly Respiratory: clear to auscultation bilaterally, no wheezing, no crackles. Normal respiratory effort. No accessory muscle use.  Cardiovascular: Regular rate and rhythm, no murmurs / rubs / gallops. No extremity edema. 2+ pedal pulses. No carotid bruits.  Abdomen: no tenderness, no masses palpated. No hepatosplenomegaly. Bowel sounds positive.  Musculoskeletal: Midthoracic skin swelling  and discoloration without drainage, tender, palpable mass. Skin: no rashes, lesions, ulcers. No induration Neurologic: CN 2-12 grossly intact. Sensation intact, DTR normal. Strength 5/5 in all 4. Normal strength BLE. Psychiatric: Normal judgment and insight. Alert and oriented x 3. Normal mood.    Labs on Admission: I have personally reviewed following labs and imaging studies  CBC: No results for input(s): WBC, NEUTROABS, HGB, HCT, MCV, PLT in the last 168 hours. Basic Metabolic Panel: No results for input(s): NA, K, CL, CO2, GLUCOSE, BUN, CREATININE, CALCIUM, MG, PHOS in the last 168 hours. GFR: CrCl  cannot be calculated (No order found.). Liver Function Tests: No results for input(s): AST, ALT, ALKPHOS, BILITOT, PROT, ALBUMIN in the last 168 hours. No results for input(s): LIPASE, AMYLASE in the last 168 hours. No results for input(s): AMMONIA in the last 168 hours. Coagulation Profile: No results for input(s): INR, PROTIME in the last 168 hours. Cardiac Enzymes: No results for input(s): CKTOTAL, CKMB, CKMBINDEX, TROPONINI in the last 168 hours. BNP (last 3 results) No results for input(s): PROBNP in the last 8760 hours. HbA1C: No results for input(s): HGBA1C in the last 72 hours. CBG: No results for input(s): GLUCAP in the last 168 hours. Lipid Profile: No results for input(s): CHOL, HDL, LDLCALC, TRIG, CHOLHDL, LDLDIRECT in the last 72 hours. Thyroid Function Tests: No results for input(s): TSH, T4TOTAL, FREET4, T3FREE, THYROIDAB in the last 72 hours. Anemia Panel: No results for input(s): VITAMINB12, FOLATE, FERRITIN, TIBC, IRON, RETICCTPCT in the last 72 hours. Urine analysis: No results found for: COLORURINE, APPEARANCEUR, LABSPEC, PHURINE, GLUCOSEU, HGBUR, BILIRUBINUR, KETONESUR, PROTEINUR, UROBILINOGEN, NITRITE, LEUKOCYTESUR  Radiological Exams on Admission: No results found.  EKG: Independently reviewed.  Assessment/Plan Principal Problem:   Sepsis (HCC) Active Problems:   DM2 (diabetes mellitus, type 2) (HCC)   IVDU (intravenous drug user)   Community acquired pneumonia of left lower lobe of lung (HCC)   Abscess of back   DKA (diabetic ketoacidoses) (HCC)    1. Sepsis - due to CAP, and or back abscess, may or may not have bacteremia too. 1. BCx pending 2. Groin abscesses grew strep species according to paperwork from [facility] 3. IVF: 1L at [facility], 2L bolus here, then 125 cc/hr 4. MRI T spine pending to see how deep the back abscess goes, eval for osteomyelitis, diskitis, and/or SEA (consults depending on findings). 5. Continue empiric zosyn / vanc for  now 6. Sputum Cx 7. Repeat CBC in AM 2. DM2 with DKA - 1. Hold home meds 2. DKA pathway 3. IVF as above 4. Insulin gtt 5. Q4H BMPs  DVT prophylaxis: SCDs only for now Code Status: Full Family Communication: No family in room Disposition Plan: TBD Consults called: None Admission status: Admit to inpatient - treatment of DKA, sepsis, CAP   Samiah Ricklefs M. DO [facility] Pager [phone]  If 7AM-7PM, please contact day team taking care of patient [email or web address] Password [facility]  12/20/2016, 12:44 AM

## 2016-12-20 NOTE — ED Notes (Signed)
Pt requesting po fluids . Explained need for NPO. Pt state he will go to CT now. CT contacted.

## 2016-12-20 NOTE — Progress Notes (Signed)
CT calling for pt to go to IR. Explained to pt what would be happening. Family at bedside. Echo tech states testing will almost be done. CT aware. Call bell and phone within reach. Will continue to monitor.

## 2016-12-20 NOTE — Progress Notes (Signed)
Of note, patients anion gap from labs at [facility] appears to have already closed.  Will instead put patient on lantus 20 daily (half home dose) and SSI sensitive scale Q4H while he is NPO.

## 2016-12-20 NOTE — Progress Notes (Signed)
Pharmacy Antibiotic Note  Keith Riggs is a 26 y.o. male admitted on 12/19/2016 with pneumonia and sepsis.  Pharmacy has been consulted for Vancocin and Zosyn dosing.  Pt had been on Keflex after having abscesses drained this week.  Concern for epidural abscess, awaiting MRI.  Strep spp on [facility] paperwork, also concern for metastatic staph infections.  Plan: Rec'd vanc 1.5g and Zosyn 3.375g IV at OSH. Vancomycin 1000mg  IV every 8 hours.  Goal trough 15-20 mcg/mL. Zosyn 3.375g IV q8h (4 hour infusion).  Height: 5\' 11"  (180.3 cm) Weight: 237 lb (107.5 kg) IBW/kg (Calculated) : 75.3  Temp (24hrs), Avg:98 F (36.7 C), Min:98 F (36.7 C), Max:98 F (36.7 C)   Allergies  Allergen Reactions  . Bee Venom Anaphylaxis  . Voltaren [Diclofenac Sodium] Other (See Comments)    "Makes me feel not myself"  . Latex Rash    Thank you for allowing pharmacy to be a part of this patient's care.  Vernard GamblesVeronda Earlisha Sharples, PharmD, BCPS  12/20/2016 12:08 AM

## 2016-12-20 NOTE — ED Notes (Addendum)
Patient refused transported to CT d/t pain.

## 2016-12-20 NOTE — Progress Notes (Signed)
Pt arrived on 4e12. Placed on monitor. Oriented to room. Pt c/o 10/10 generalized pain. Echo tech in room to complete echo on pt. Call bell and phone within reach.

## 2016-12-20 NOTE — Progress Notes (Signed)
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

## 2016-12-20 NOTE — Progress Notes (Signed)
  Echocardiogram 2D Echocardiogram has been performed.  Keith Riggs 12/20/2016, 2:54 PM

## 2016-12-20 NOTE — Progress Notes (Signed)
[facility]  CT scan report reviewed. CT surgery consult requested for loculated pleural effusion which is likely an empyema.    Keith CantorSaima Kathrynn Backstrom, MD

## 2016-12-20 NOTE — Consult Note (Signed)
[address]       [place] [address]             [phone]        Keith Riggs [facility] Medical Record #[id number] Date of Birth: 02/17/1991  Referring: No ref. provider found Primary Care: System, Pcp Not In  Chief Complaint:    Chief Complaint  Patient presents with  . Back Pain    History of Present Illness:    Keith Riggs is a 26 y.o. male with history of diabetes mellitus and a history of intravenous drug use. He  developed bilateral groin abscesses, fever, chills and mid back pain. In [place] his  right groin abscess was incised and drained 3 days ago. Yesterday the other side was drained and he was released  .   He returned to the ED there because of progressively severe back pain and was transferred here. 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 There are also multiple paraspinous abscesses There are bilateral cavitary lung nodules and loculated pleural effusions left greater than right. Abdominal CT scan shows evidence of bilateral pyelonephritis, a right perinephric abscess and left buttock abscess.   Current Activity/ Functional Status: Patient was independent with mobility/ambulation, transfers, ADL's, IADL's.   Zubrod Score: At the time of surgery this patient's most appropriate activity status/level should be described as: []     0    Normal activity, no symptoms [x]     1    Restricted in physical strenuous activity but ambulatory, able to do out light work []     2    Ambulatory and capable of self care, unable to do work activities, up and about                 more than 50%  Of the time                            []     3    Only limited self care, in bed greater than 50% of waking hours []     4    Completely disabled, no self care, confined to bed or chair []     5    Moribund  Past Medical History:  Diagnosis Date  . Abscess   . Diabetes mellitus without complication [facility])     Past Surgical  History:  Procedure Laterality Date  . DENTAL SURGERY    . TONSILLECTOMY      History  Smoking Status  . Current Every Day Smoker  . Types: Cigarettes  Smokeless Tobacco  . Not on file    History  Alcohol Use  . Yes    Social History   Social History  . Marital status: Single    Spouse name: N/A  . Number of children: N/A  . Years of education: N/A   Occupational History  . Not on file.   Social History Main Topics  . Smoking status: Current Every Day Smoker    Types: Cigarettes  . Smokeless tobacco: Not on file  . Alcohol use Yes  . Drug use: Yes    Types: IV  . Sexual activity: Not on file   Other Topics Concern  . Not on file   Social History Narrative  . No narrative on file    Allergies  Allergen Reactions  . Bee Venom Anaphylaxis  . Voltaren [Diclofenac Sodium] Other (  See Comments)    "Makes me feel not myself"  . Latex Rash    Current Facility-Administered Medications  Medication Dose Route Frequency Provider Last Rate Last Dose  . 0.9 %  sodium chloride infusion   Intravenous Continuous Hillary Bow, DO 125 mL/hr at 12/20/16 1254 125 mL/hr at 12/20/16 1254  . acetaminophen (TYLENOL) tablet 650 mg  650 mg Oral Q6H PRN Hillary Bow, DO      . cefTRIAXone (ROCEPHIN) 2 g in dextrose 5 % 50 mL IVPB  2 g Intravenous Q24H Cliffton Asters, MD 100 mL/hr at 12/20/16 1300 2 g at 12/20/16 1300  . HYDROmorphone (DILAUDID) injection 0.5-1 mg  0.5-1 mg Intravenous Q4H PRN Hillary Bow, DO   1 mg at 12/20/16 1408  . insulin aspart (novoLOG) injection 0-9 Units  0-9 Units Subcutaneous Q4H Hillary Bow, DO   2 Units at 12/20/16 1258  . insulin glargine (LANTUS) injection 20 Units  20 Units Subcutaneous Daily Hillary Bow, DO   20 Units at 12/20/16 1220  . lidocaine (PF) (XYLOCAINE) 1 % injection           . ondansetron (ZOFRAN) injection 4 mg  4 mg Intravenous Q6H PRN Hillary Bow, DO      . vancomycin (VANCOCIN) IVPB 1000 mg/200 mL premix   1,000 mg Intravenous Q8H Juliette Mangle, RPH   Stopped at 12/20/16 1153    Prescriptions Prior to Admission  Medication Sig Dispense Refill Last Dose  . cephALEXin (KEFLEX) 500 MG capsule Take 500 mg by mouth 4 (four) times daily.  0 12/19/2016 at Unknown time  . HYDROcodone-acetaminophen (NORCO/VICODIN) 5-325 MG tablet Take 1 tablet by mouth every 4 (four) hours as needed. for pain  0 12/19/2016 at Unknown time  . Insulin Glargine (TOUJEO MAX SOLOSTAR) 300 UNIT/ML SOPN Inject 40 Units into the skin daily.   12/19/2016 at Unknown time  . metFORMIN (GLUCOPHAGE) 500 MG tablet Take 500 mg by mouth 2 (two) times daily with a meal.   12/19/2016 at Unknown time    No family history on file.   Review of Systems:  Pertinent items are noted in HPI.     Cardiac Review of Systems: Y or N  Chest Pain [ y   ]  Resting SOB [ y  ] Exertional SOB  Cove.Etienne  ]  Orthopnea Cove.Etienne  ]   Pedal Edema [ n  ]    Palpitations Milo.Brash  ] Syncope  [ n ]   Presyncope [n   ]  General Review of Systems: [Y] = yes [  ]=no Constitional: recent weight change [  ]; anorexia [  ]; fatigue [  ]; nausea [  ]; night sweats [ y ]; fever [ y ]; or chills [ y ]                                                               Dental: poor dentition[  ]; Last Dentist visit:   Eye : blurred vision [  ]; diplopia [   ]; vision changes [n  ];  Amaurosis fugax[n  ]; Resp: cough [  ];  wheezing[  ];  hemoptysis[  ]; shortness of breath[  ]; paroxysmal nocturnal dyspnea[  ]; dyspnea on  exertion[  ]; or orthopnea[  ];  GI:  gallstones[  ], vomiting[  ];  dysphagia[  ]; melena[  ];  hematochezia [  ]; heartburn[  ];   Hx of  Colonoscopy[  ]; GU: kidney stones [  ]; hematuria[  ];   dysuria [  ];  nocturia[  ];  history of     obstruction [  ]; urinary frequency [  ]             Skin: rash, swelling[  ];, hair loss[  ];  peripheral edema[  ];  or itching[  ]; Musculosketetal: myalgias[  ];  joint swelling[  ];  joint erythema[  ];  joint pain[  ];  back pain[   ];  Heme/Lymph: bruising[  ];  bleeding[  ];  anemia[  ];  Neuro: TIA[  ];  headaches[  ];  stroke[  ];  vertigo[  ];  seizures[ n ];   paresthesias[  ];  difficulty walking[y  ];  Psych:depression[  ]; anxiety[  ];  Endocrine: diabetes[ y ];  thyroid dysfunction[  ];  Immunizations: Flu [  ]; Pneumococcal[  ];  Other:  Physical Exam: BP (!) 147/93   Pulse (!) 120   Temp 100.3 F (37.9 C) (Oral)   Resp (!) 39   Ht 5\' 11"  (1.803 m)   Wt 237 lb (107.5 kg)   SpO2 92%   BMI 33.05 kg/m    General appearance: alert, cooperative, appears older than stated age and moderate distress Head: Normocephalic, without obvious abnormality, atraumatic Neck: no adenopathy, no carotid bruit, no JVD, supple, symmetrical, trachea midline and thyroid not enlarged, symmetric, no tenderness/mass/nodules Lymph nodes: Cervical, supraclavicular, and axillary nodes normal. Resp: diminished breath sounds LLL Back: tenderness over right paaspinous ares t5-6 , no erythymia  Cardio: regular rate and rhythm, S1, S2 normal, no murmur, click, rub or gallop GI: soft, non-tender; bowel sounds normal; no masses,  no organomegaly Genitalia: bilaterial area open drainag from surgery this week in [place]  Extremities: extremities normal, atraumatic, no cyanosis or edema and Homans sign is negative, no sign of DVT Neurologic: Grossly normal Numerous tattoos   Diagnostic Studies & Laboratory data:     Recent Radiology Findings:   Ct Chest W Contrast  Result Date: 12/20/2016 CLINICAL DATA:  Worsening left back pain over the past 2 weeks with nausea and vomiting. Thoracic spine MR earlier today demonstrating multifocal epidural abscess with paraspinal myositis and paraspinal muscle abscesses and bilateral pleural empyema with probable pneumonia and possible lung abscesses. Chest CT was recommended. 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 EXAM: CT CHEST, ABDOMEN, AND PELVIS WITH CONTRAST TECHNIQUE:  Multidetector CT imaging of the chest, abdomen and pelvis was performed following the standard protocol during bolus administration of intravenous contrast. CONTRAST:  100mL ISOVUE-300 IOPAMIDOL (ISOVUE-300) INJECTION 61% COMPARISON:  Thoracic spine MR obtained earlier today. FINDINGS: CT CHEST FINDINGS Cardiovascular: No significant vascular findings. Normal heart size. No pericardial effusion. Mediastinum/Nodes: Prominent AP window lymph node with a short axis diameter of 11 mm on image number 27 of series 3. Mildly prominent precarinal node with a short axis diameter of 8 mm on image number 46 of series 3. Lungs/Pleura: Large number of bilateral lung nodules, most numerous on the right. Some of these demonstrate central cavitation. The largest on the left is at the medial left lung apex, measuring 1.7 cm in maximum diameter on image number 39 of series 4. The largest on  the right is in the posterior aspect of the right upper lobe, with a maximum diameter of 1.6 cm on image number 53 of series 4. Small right pleural effusion and mild associated right lower lobe atelectasis. Moderate-sized multiloculated left pleural effusion and moderate compressive and dependent atelectasis. Musculoskeletal: Previously described left paraspinal muscle abscess. This is largest on image number 32 of series 3, measuring 3.9 x 2.3 cm on that image. There is a more discrete abscess with rim enhancement on the left on image number 36 of series 3, measuring 2.0 x 1.4 cm. The previously demonstrated multifocal epidural abscess is better visualized on the MR images. No bone destruction seen. Mild thoracic and lower cervical spine degenerative changes. CT ABDOMEN PELVIS FINDINGS Hepatobiliary: No focal liver abnormality is seen. No gallstones, gallbladder wall thickening, or biliary dilatation. Pancreas: Unremarkable. No pancreatic ductal dilatation or surrounding inflammatory changes. Spleen: Normal in size without focal abnormality.  Adrenals/Urinary Tract: Both kidneys are mildly enlarged with poorly defined areas low density. There is a very small medial right perinephric fluid collection measuring 1.9 x 1.1 cm on image number 76 series 3. Minimal bilateral perinephric soft tissue stranding. Unremarkable ureters, urinary bladder and adrenal glands. No hydronephrosis. Stomach/Bowel: Stomach is within normal limits. Appendix appears normal. No evidence of bowel wall thickening, distention, or inflammatory changes. Vascular/Lymphatic: No significant vascular findings are present. No enlarged abdominal or pelvic lymph nodes. Reproductive: Prostate is unremarkable. Other: Very small bilateral inguinal hernias containing fat. Musculoskeletal: The left paraspinal muscle abscesses extend to the level of the upper abdomen. The largest portion of the abscess at that level measures 4.3 x 2.7 cm on image number 58 of series 3. No bone destruction is seen. An L1 vertebral body hemangioma is incidentally noted. There is soft tissue gas in the inferior, medial left buttock region subcutaneous fat on the most inferior images with no fluid collection seen on those images. IMPRESSION: 1. Multiple cavitary nodules in both lungs, compatible with septic emboli. 2. Moderate-sized multiloculated left pleural effusion. The multiple loculations are compatible with empyema formation. 3. Previously described epidural and left paraspinal abscesses. 4. Bilateral lung atelectasis, greater on the left. 5. Soft tissue gas in the inferior, medial left buttock region, compatible with the patient's known groin abscesses. 6. Evidence of bilateral pyelonephritis with a small right perinephric abscess or small flattened exophytic cyst. 7. Minimal mediastinal adenopathy, most likely reactive. Electronically Signed   By: Beckie Salts M.D.   On: 12/20/2016 09:11   Mr Thoracic Spine W Wo Contrast  Result Date: 12/20/2016 CLINICAL DATA:  Severe mid back pain for 2 weeks. History of  diabetes , intravenous drug use and recent groin abscess. EXAM: MRI THORACIC WITHOUT AND WITH CONTRAST TECHNIQUE: Multiplanar and multiecho pulse sequences of the thoracic spine were obtained without and with intravenous contrast. CONTRAST:  20mL MULTIHANCE GADOBENATE DIMEGLUMINE 529 MG/ML IV SOLN COMPARISON:  Thoracic spine radiograph December 15, 2016 FINDINGS: Generally poor signal to noise ratio due to habitus and motion. The axial sequences of the lower thoracic spine are nondiagnostic. ALIGNMENT: Straightened thoracic kyphosis. No malalignment. VERTEBRAE/DISCS: Vertebral bodies are intact. Intervertebral discs morphology and signal are normal. Faintly increased STIR signal and enhancement T6 vertebral body. No abnormal disc enhancement. CORD: 1.7 x 1.3 x 7.8 cm (transverse by AP by CC) ventral epidural abscess from T4-5 through T8-9 resulting in severe cord compression T5-6 through T7 nondiagnostic examination for spinal cord edema. No syrinx. LEFT ventral epidural abscess from T2 to T5-6, 7.1 cm in  cranial caudad dimension resulting in mild cord compression. PREVERTEBRAL AND PARASPINAL SOFT TISSUES: Partially imaged lobulated, potentially loculated LEFT pleural effusion. Patchy LEFT lung consolidation. T2 bright signal within the bilateral paraspinal muscles compatible with mild bursitis. Complex 4.5 x 2.5 x 13.8 cm LEFT paraspinal muscle abscess from T5 through T12. 0.8 x 0.8 x 8.3 cm RIGHT paraspinal muscle abscess at the level of the epidural collection. Prevertebral subpleural abscesses bilaterally measuring to at least 1.4 x 2.4 cm on the LEFT. IMPRESSION: 1. Limited examination, nondiagnostic evaluation through the lower thoracic spine. 2. 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 No syrinx. 3. 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 4. Paraspinal myositis with paraspinal muscle abscesses, LEFT greater than  RIGHT. 5. Bilateral pleural empyema and, probable pneumonia. Lung abscess is possible. Recommend CT chest with contrast. Critical Value/emergent results were called by telephone at the time of interpretation on 12/20/2016 at 5:21 am to Dr. Azalia Bilis, ED, who verbally acknowledged these results. Electronically Signed   By: Awilda Metro M.D.   On: 12/20/2016 05:25   Ct Abdomen Pelvis W Contrast  Result Date: 12/20/2016 CLINICAL DATA:  Worsening left back pain over the past 2 weeks with nausea and vomiting. Thoracic spine MR earlier today demonstrating multifocal epidural abscess with paraspinal myositis and paraspinal muscle abscesses and bilateral pleural empyema with probable pneumonia and possible lung abscesses. Chest CT was recommended. 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 EXAM: CT CHEST, ABDOMEN, AND PELVIS WITH CONTRAST TECHNIQUE: Multidetector CT imaging of the chest, abdomen and pelvis was performed following the standard protocol during bolus administration of intravenous contrast. CONTRAST:  ISOVUE-300 IOPAMIDOL (ISOVUE-300) INJECTION 61% COMPARISON:  Thoracic spine MR obtained earlier today. FINDINGS: CT CHEST FINDINGS Cardiovascular: No significant vascular findings. Normal heart size. No pericardial effusion. Mediastinum/Nodes: Prominent AP window lymph node with a short axis diameter of 11 mm on image number 27 of series 3. Mildly prominent precarinal node with a short axis diameter of 8 mm on image number 46 of series 3. Lungs/Pleura: Large number of bilateral lung nodules, most numerous on the right. Some of these demonstrate central cavitation. The largest on the left is at the medial left lung apex, measuring 1.7 cm in maximum diameter on image number 39 of series 4. The largest on the right is in the posterior aspect of the right upper lobe, with a maximum diameter of 1.6 cm on image number 53 of series 4. Small right pleural effusion and mild associated  right lower lobe atelectasis. Moderate-sized multiloculated left pleural effusion and moderate compressive and dependent atelectasis. Musculoskeletal: Previously described left paraspinal muscle abscess. This is largest on image number 32 of series 3, measuring 3.9 x 2.3 cm on that image. There is a more discrete abscess with rim enhancement on the left on image number 36 of series 3, measuring 2.0 x 1.4 cm. The previously demonstrated multifocal epidural abscess is better visualized on the MR images. No bone destruction seen. Mild thoracic and lower cervical spine degenerative changes. CT ABDOMEN PELVIS FINDINGS Hepatobiliary: No focal liver abnormality is seen. No gallstones, gallbladder wall thickening, or biliary dilatation. Pancreas: Unremarkable. No pancreatic ductal dilatation or surrounding inflammatory changes. Spleen: Normal in size without focal abnormality. Adrenals/Urinary Tract: Both kidneys are mildly enlarged with poorly defined areas low density. There is a very small medial right perinephric fluid collection measuring 1.9 x 1.1 cm on image number  76 series 3. Minimal bilateral perinephric soft tissue stranding. Unremarkable ureters, urinary bladder and adrenal glands. No hydronephrosis. Stomach/Bowel: Stomach is within normal limits. Appendix appears normal. No evidence of bowel wall thickening, distention, or inflammatory changes. Vascular/Lymphatic: No significant vascular findings are present. No enlarged abdominal or pelvic lymph nodes. Reproductive: Prostate is unremarkable. Other: Very small bilateral inguinal hernias containing fat. Musculoskeletal: The left paraspinal muscle abscesses extend to the level of the upper abdomen. The largest portion of the abscess at that level measures 4.3 x 2.7 cm on image number 58 of series 3. No bone destruction is seen. An L1 vertebral body hemangioma is incidentally noted. There is soft tissue gas in the inferior, medial left buttock region subcutaneous  fat on the most inferior images with no fluid collection seen on those images. IMPRESSION: 1. Multiple cavitary nodules in both lungs, compatible with septic emboli. 2. Moderate-sized multiloculated left pleural effusion. The multiple loculations are compatible with empyema formation. 3. Previously described epidural and left paraspinal abscesses. 4. Bilateral lung atelectasis, greater on the left. 5. Soft tissue gas in the inferior, medial left buttock region, compatible with the patient's known groin abscesses. 6. Evidence of bilateral pyelonephritis with a small right perinephric abscess or small flattened exophytic cyst. 7. Minimal mediastinal adenopathy, most likely reactive. Electronically Signed   By: Beckie Salts M.D.   On: 12/20/2016 09:11     I have independently reviewed the above radiologic studies.  Recent Lab Findings: Lab Results  Component Value Date   WBC 26.5 (H) 12/20/2016   HGB 10.3 (L) 12/20/2016   HCT 31.6 (L) 12/20/2016   PLT 529 (H) 12/20/2016   GLUCOSE 195 (H) 12/20/2016   NA 127 (L) 12/20/2016   K 3.6 12/20/2016   CL 95 (L) 12/20/2016   CREATININE 0.64 12/20/2016   BUN 5 (L) 12/20/2016   CO2 20 (L) 12/20/2016    Transthoracic Echocardiography  Patient:    Dorsie, Burich MR #:       [id number] Study Date: 12/20/2016 Gender:     M Age:        26 Height:     180.3 cm Weight:     107.5 kg BSA:        2.35 m^2 Pt. Status: Room:       [address]    [place]    [place] [id number]  [id number]     AOZHYQ, Saima [id number]  Tommi Emery [id number]  PERFORMING   [facility], Inpatient  SONOGRAPHER  [facility]  cc:  ------------------------------------------------------------------- LV EF: 70% -   75%  ------------------------------------------------------------------- Indications:      Bacteremia 790.7.  ------------------------------------------------------------------- History:   Risk factors:  IVDU. Diabetes  mellitus.  ------------------------------------------------------------------- Study Conclusions  - Left ventricle: The cavity size was normal. Wall thickness was   normal. Systolic function was vigorous. The estimated ejection   fraction was in the range of 70% to 75%. Wall motion was normal;   there were no regional wall motion abnormalities. Left   ventricular diastolic function parameters were normal. - Tricuspid valve: There was trivial regurgitation. - Pericardium, extracardiac: There was a left pleural effusion.  Impressions:  - There was no evidence of a vegetation.  ------------------------------------------------------------------- Study data:  No prior study was available for comparison.  Study status:  Routine.  Procedure:  Transthoracic echocardiography. Image quality was adequate.  Study completion:  There were no complications.  Transthoracic echocardiography.  M-mode, complete 2D, spectral Doppler, and color Doppler.  Birthdate: Patient birthdate: April 20, 1991.  Age:  Patient is 26 yr old.  Sex: Gender: male.    BMI: 33.1 kg/m^2.  Blood pressure:     133/68 Patient status:  Inpatient.  Study date:  Study date: 12/20/2016. Study time: 02:07 PM.  Location:  Bedside.  -------------------------------------------------------------------  ------------------------------------------------------------------- Left ventricle:  The cavity size was normal. Wall thickness was normal. Systolic function was vigorous. The estimated ejection fraction was in the range of 70% to 75%. Wall motion was normal; there were no regional wall motion abnormalities. Left ventricular diastolic function parameters were normal.  ------------------------------------------------------------------- Aortic valve:  Poorly visualized.  Trileaflet; normal thickness leaflets. Mobility was not restricted.  Doppler:  Transvalvular velocity was within the normal range. There was no  stenosis. There was no regurgitation.  ------------------------------------------------------------------- Aorta:  Aortic root: The aortic root was normal in size.  ------------------------------------------------------------------- Mitral valve:   Structurally normal valve.   Mobility was not restricted.  Doppler:  Transvalvular velocity was within the normal range. There was no evidence for stenosis. There was no regurgitation.    Peak gradient (D): 6 mm Hg.  ------------------------------------------------------------------- Left atrium:  The atrium was normal in size.  ------------------------------------------------------------------- Right ventricle:  The cavity size was normal. Wall thickness was normal. Systolic function was normal.  ------------------------------------------------------------------- Pulmonic valve:   Poorly visualized.  Structurally normal valve. Cusp separation was normal.  Doppler:  Transvalvular velocity was within the normal range. There was no evidence for stenosis. There was no regurgitation.  ------------------------------------------------------------------- Tricuspid valve:   Structurally normal valve.    Doppler: Transvalvular velocity was within the normal range. There was trivial regurgitation.  ------------------------------------------------------------------- Pulmonary artery:   The main pulmonary artery was normal-sized. Systolic pressure was within the normal range.  ------------------------------------------------------------------- Right atrium:  The atrium was normal in size.  ------------------------------------------------------------------- Pericardium:  There was no pericardial effusion.  ------------------------------------------------------------------- Systemic veins: Inferior vena cava: The vessel was normal in size.  ------------------------------------------------------------------- Pleura:  There was a  left pleural effusion.  ------------------------------------------------------------------- Measurements   Left ventricle                         Value        Reference  LV ID, ED, PLAX chordal        (L)     42    mm     43 - 52  LV ID, ES, PLAX chordal                27    mm     23 - 38  LV fx shortening, PLAX chordal         36    %      >=29  LV PW thickness, ED                    11    mm     ----------  IVS/LV PW ratio, ED                    1            <=1.3  Stroke volume, 2D                      79    ml     ----------  Stroke volume/bsa, 2D  34    ml/m^2 ----------  LV e&', lateral                         10.4  cm/s   ----------  LV E/e&', lateral                       11.44        ----------  LV e&', medial                          8.39  cm/s   ----------  LV E/e&', medial                        14.18        ----------  LV e&', average                         9.4   cm/s   ----------  LV E/e&', average                       12.67        ----------    Ventricular septum                     Value        Reference  IVS thickness, ED                      11    mm     ----------    LVOT                                   Value        Reference  LVOT ID, S                             20    mm     ----------  LVOT area                              3.14  cm^2   ----------  LVOT peak velocity, S                  128   cm/s   ----------  LVOT mean velocity, S                  89.8  cm/s   ----------  LVOT VTI, S                            25.1  cm     ----------  LVOT peak gradient, S                  7     mm Hg  ----------    Aorta                                  Value        Reference  Aortic root ID, ED  32    mm     ----------    Left atrium                            Value        Reference  LA ID, A-P, ES                         33    mm     ----------  LA ID/bsa, A-P                         1.4   cm/m^2 <=2.2  LA volume, S                            35.8  ml     ----------  LA volume/bsa, S                       15.2  ml/m^2 ----------  LA volume, ES, 1-p A4C                 30.3  ml     ----------  LA volume/bsa, ES, 1-p A4C             12.9  ml/m^2 ----------  LA volume, ES, 1-p A2C                 37.2  ml     ----------  LA volume/bsa, ES, 1-p A2C             15.8  ml/m^2 ----------    Mitral valve                           Value        Reference  Mitral E-wave peak velocity            119   cm/s   ----------  Mitral A-wave peak velocity            129   cm/s   ----------  Mitral deceleration time       (L)     134   ms     150 - 230  Mitral peak gradient, D                6     mm Hg  ----------  Mitral E/A ratio, peak                 0.9          ----------    Right atrium                           Value        Reference  RA ID, S-I, ES, A4C                    36.5  mm     34 - 49  RA area, ES, A4C                       10.3  cm^2   8.3 - 19.5  RA volume, ES, A/L  23.1  ml     ----------  RA volume/bsa, ES, A/L                 9.8   ml/m^2 ----------    Systemic veins                         Value        Reference  Estimated CVP                          3     mm Hg  ----------    Right ventricle                        Value        Reference  TAPSE                                  23.8  mm     ----------  RV s&', lateral, S                      18.2  cm/s   ----------  Legend: (L)  and  (H)  mark values outside specified reference range.  ------------------------------------------------------------------- Prepared and Electronically Authenticated by  Donato Schultz, M.D. 2018-07-14T15:07:14   I have independently reviewed the above  Echo   .  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 The total time spent in the appointment was 60 minutes.    Delight Ovens MD      [address] [place].[address] [place] [address] Office [phone]   Beeper [phone]  12/20/2016 3:18 PM

## 2016-12-20 NOTE — Consult Note (Signed)
CC:  Chief Complaint  Patient presents with  . Back Pain    HPI: Keith Riggs is a 26 y.o. male,. pmhx IV drug use, who presented to ER with worsening thoracic back pain. Has been battling a dull, mild pain for several weeks but 2 weeks ago, without known cause, pain increased in severity drastically. Localized to mid thoracic spine. Describes the pain as stabbing, throbbing. Constant. Worsened with movement and laying flat. No relieving factors. No home treatment. Denies numbness/tingling of extremities, weakness of extremities, bowel or bladder dysfunction. Has been having some other health issues over the last several days including coughing, dyspnea and poorly controlled blood sugars.   PMH: Past Medical History:  Diagnosis Date  . Abscess   . Diabetes mellitus without complication (HCC)     PSH: Past Surgical History:  Procedure Laterality Date  . DENTAL SURGERY    . TONSILLECTOMY      SH: Social History  Substance Use Topics  . Smoking status: Current Every Day Smoker    Types: Cigarettes  . Smokeless tobacco: Not on file  . Alcohol use Yes    MEDS: Prior to Admission medications   Medication Sig Start Date End Date Taking? Authorizing Provider  cephALEXin (KEFLEX) 500 MG capsule Take 500 mg by mouth 4 (four) times daily. 12/15/16  Yes [provider]  HYDROcodone-acetaminophen (NORCO/VICODIN) 5-325 MG tablet Take 1 tablet by mouth every 4 (four) hours as needed. for pain 12/17/16  Yes [provider]  Insulin Glargine (TOUJEO MAX SOLOSTAR) 300 UNIT/ML SOPN Inject 40 Units into the skin daily.   Yes [provider]  metFORMIN (GLUCOPHAGE) 500 MG tablet Take 500 mg by mouth 2 (two) times daily with a meal.   Yes [provider]    ALLERGY: Allergies  Allergen Reactions  . Bee Venom Anaphylaxis  . Voltaren [Diclofenac Sodium] Other (See Comments)    "Makes me feel not myself"  . Latex Rash    ROS: Review of Systems   Constitutional: Positive for chills, diaphoresis, fever and malaise/fatigue.  Respiratory: Positive for cough.   Gastrointestinal: Negative for nausea and vomiting.  Musculoskeletal: Positive for back pain and myalgias.  Neurological: Negative for dizziness, tingling, tremors, sensory change, speech change, focal weakness, seizures, loss of consciousness and headaches.    Vitals:   12/20/16 0300 12/20/16 0315  BP: (!) 147/71 (!) 149/71  Pulse: 95 96  Resp: 18 (!) 22  Temp:     General appearance: WDWN, tearful at times from pain Eyes: PERRL Cardiovascular: Regular rate and rhythm without murmurs, rubs, gallops. No edema or variciosities. Distal pulses normal. Pulmonary: Clear to auscultation Musculoskeletal:     Muscle tone upper extremities: Normal    Muscle tone lower extremities: Normal    Motor exam: Upper Extremities Deltoid Bicep Tricep Grip  Right 5/5 5/5 5/5 5/5  Left 5/5 5/5 5/5 5/5   Lower Extremity IP Quad PF DF EHL  Right 5/5 5/5 5/5 5/5 5/5  Left 5/5 5/5 5/5 5/5 5/5  Back: Exquisite TTP thoracic spine. Neurological Awake, alert, oriented Memory and concentration grossly intact Speech fluent, appropriate CNII: Visual fields normal CNIII/IV/VI: EOMI CNV: Facial sensation normal CNVII: Symmetric, normal strength CNVIII: Grossly normal CNIX: Normal palate movement CNXI: Trap and SCM strength normal CN XII: Tongue protrusion normal Sensation grossly intact to LT DTR: Normal Coordination (finger/nose & heel/shin): Normal  IMAGING: MRI Thoracic Spine IMPRESSION: 1. Limited examination, nondiagnostic evaluation through the lower thoracic spine. 2. Multiple epidural abscess, largest  from T4-5 to T8-9 measuring 1.7 x 1.3 x 7.8 cm resulting in severe cord compression, limited assessment for cord edema. No syrinx. 3. 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 4. Paraspinal myositis with paraspinal muscle abscesses, LEFT greater than RIGHT. 5. Bilateral pleural empyema and,  probable pneumonia. Lung abscess is possible. Recommend CT chest with contrast.  IMPRESSION/PLAN  26 y.o. male with sepsis secondary to epidural abscess, paraspinal muscle abscess, CAP. He is neurologically intact. Advised he is poor surgical candidate due to pulmonary issues and DKA at this time. Because he is neuro intact, ok to manage medically.  - Rec consult ID for abx - Monitor neuro exam and report any changes - Call for any concerns

## 2016-12-20 NOTE — Procedures (Signed)
Left empyema  S/p CT LT CHEST TUBE INSERTION  No comp Stable Pus aspirated cx sent Full report in PACS KEEP TO [facility]

## 2016-12-20 NOTE — ED Notes (Signed)
Patient transported to MRI 

## 2016-12-20 NOTE — Sedation Documentation (Signed)
Patient denies pain and is resting comfortably.  

## 2016-12-20 NOTE — ED Notes (Signed)
Attempted to call report

## 2016-12-20 NOTE — ED Notes (Signed)
NeuroSurgery at bedside.

## 2016-12-20 NOTE — ED Notes (Signed)
Patient transported to CT 

## 2016-12-21 ENCOUNTER — Inpatient Hospital Stay (HOSPITAL_COMMUNITY): Payer: Self-pay

## 2016-12-21 ENCOUNTER — Encounter (HOSPITAL_COMMUNITY): Payer: Self-pay

## 2016-12-21 DIAGNOSIS — F17210 Nicotine dependence, cigarettes, uncomplicated: Secondary | ICD-10-CM

## 2016-12-21 DIAGNOSIS — Z9689 Presence of other specified functional implants: Secondary | ICD-10-CM

## 2016-12-21 LAB — VANCOMYCIN, TROUGH

## 2016-12-21 LAB — CBC
HCT: 32.9 % — ABNORMAL LOW (ref 39.0–52.0)
HEMOGLOBIN: 10.7 g/dL — AB (ref 13.0–17.0)
MCH: 27.3 pg (ref 26.0–34.0)
MCHC: 32.5 g/dL (ref 30.0–36.0)
MCV: 83.9 fL (ref 78.0–100.0)
PLATELETS: 544 10*3/uL — AB (ref 150–400)
RBC: 3.92 MIL/uL — AB (ref 4.22–5.81)
RDW: 14.1 % (ref 11.5–15.5)
WBC: 26 10*3/uL — AB (ref 4.0–10.5)

## 2016-12-21 LAB — BLOOD CULTURE ID PANEL (REFLEXED)
Acinetobacter baumannii: NOT DETECTED
CANDIDA GLABRATA: NOT DETECTED
CANDIDA TROPICALIS: NOT DETECTED
Candida albicans: NOT DETECTED
Candida krusei: NOT DETECTED
Candida parapsilosis: NOT DETECTED
ENTEROBACTER CLOACAE COMPLEX: NOT DETECTED
ENTEROBACTERIACEAE SPECIES: NOT DETECTED
ENTEROCOCCUS SPECIES: NOT DETECTED
Escherichia coli: NOT DETECTED
HAEMOPHILUS INFLUENZAE: NOT DETECTED
KLEBSIELLA PNEUMONIAE: NOT DETECTED
Klebsiella oxytoca: NOT DETECTED
LISTERIA MONOCYTOGENES: NOT DETECTED
METHICILLIN RESISTANCE: DETECTED — AB
NEISSERIA MENINGITIDIS: NOT DETECTED
PROTEUS SPECIES: NOT DETECTED
Pseudomonas aeruginosa: NOT DETECTED
STAPHYLOCOCCUS SPECIES: DETECTED — AB
STREPTOCOCCUS AGALACTIAE: NOT DETECTED
STREPTOCOCCUS SPECIES: NOT DETECTED
Serratia marcescens: NOT DETECTED
Staphylococcus aureus (BCID): DETECTED — AB
Streptococcus pneumoniae: NOT DETECTED
Streptococcus pyogenes: NOT DETECTED

## 2016-12-21 LAB — MRSA PCR SCREENING: MRSA BY PCR: POSITIVE — AB

## 2016-12-21 LAB — BASIC METABOLIC PANEL
Anion gap: 7 (ref 5–15)
Anion gap: 8 (ref 5–15)
BUN: <5 mg/dL — ABNORMAL LOW (ref 6–20)
BUN: <5 mg/dL — ABNORMAL LOW (ref 6–20)
CALCIUM: 7.9 mg/dL — AB (ref 8.9–10.3)
CHLORIDE: 90 mmol/L — AB (ref 101–111)
CO2: 27 mmol/L (ref 22–32)
CO2: 26 mmol/L (ref 22–32)
CREATININE: 0.49 mg/dL — AB (ref 0.61–1.24)
CREATININE: 0.6 mg/dL — AB (ref 0.61–1.24)
Calcium: 7.9 mg/dL — ABNORMAL LOW (ref 8.9–10.3)
Chloride: 93 mmol/L — ABNORMAL LOW (ref 101–111)
GFR calc non Af Amer: >60 mL/min (ref >60)
Glucose, Bld: 220 mg/dL — ABNORMAL HIGH (ref 65–99)
Glucose, Bld: 260 mg/dL — ABNORMAL HIGH (ref 65–99)
POTASSIUM: 3.6 mmol/L (ref 3.5–5.1)
Potassium: 3 mmol/L — ABNORMAL LOW (ref 3.5–5.1)
SODIUM: 127 mmol/L — AB (ref 135–145)
SODIUM: 124 mmol/L — AB (ref 135–145)

## 2016-12-21 LAB — GLUCOSE, CAPILLARY
GLUCOSE-CAPILLARY: 209 mg/dL — AB (ref 65–99)
GLUCOSE-CAPILLARY: 214 mg/dL — AB (ref 65–99)
GLUCOSE-CAPILLARY: 231 mg/dL — AB (ref 65–99)
GLUCOSE-CAPILLARY: 285 mg/dL — AB (ref 65–99)
Glucose-Capillary: 233 mg/dL — ABNORMAL HIGH (ref 65–99)
Glucose-Capillary: 249 mg/dL — ABNORMAL HIGH (ref 65–99)
Glucose-Capillary: 271 mg/dL — ABNORMAL HIGH (ref 65–99)

## 2016-12-21 LAB — MAGNESIUM: MAGNESIUM: 1.6 mg/dL — AB (ref 1.7–2.4)

## 2016-12-21 LAB — FOLATE: Folate: 9.8 ng/mL (ref >5.9)

## 2016-12-21 LAB — OSMOLALITY, URINE: OSMOLALITY UR: 435 mosm/kg (ref 300–900)

## 2016-12-21 LAB — SODIUM, URINE, RANDOM: Sodium, Ur: 102 mmol/L

## 2016-12-21 LAB — VITAMIN B12: VITAMIN B 12: 397 pg/mL (ref 180–914)

## 2016-12-21 MED ORDER — VANCOMYCIN HCL IN DEXTROSE 1-5 GM/200ML-% IV SOLN
1000.0000 mg | Freq: Three times a day (TID) | INTRAVENOUS | Status: DC
  Filled 2016-12-21 (×2): qty 200

## 2016-12-21 MED ORDER — POTASSIUM CHLORIDE CRYS ER 20 MEQ PO TBCR
40.0000 meq | EXTENDED_RELEASE_TABLET | ORAL | Status: AC
  Administered 2016-12-21 (×2): 40 meq via ORAL
  Filled 2016-12-21 (×2): qty 2

## 2016-12-21 MED ORDER — OXYCODONE HCL 5 MG PO TABS: 5.0000 mg | ORAL_TABLET | Freq: Four times a day (QID) | ORAL | Status: DC

## 2016-12-21 MED ORDER — NICOTINE 21 MG/24HR TD PT24
21.0000 mg | MEDICATED_PATCH | Freq: Every day | TRANSDERMAL | Status: DC
  Administered 2016-12-21 – 2017-01-04 (×6): 21 mg via TRANSDERMAL
  Filled 2016-12-21 (×14): qty 1

## 2016-12-21 MED ORDER — MAGNESIUM SULFATE 2 GM/50ML IV SOLN
2.0000 g | Freq: Once | INTRAVENOUS | Status: AC
  Administered 2016-12-21: 2 g via INTRAVENOUS
  Filled 2016-12-21: qty 50

## 2016-12-21 MED ORDER — INSULIN GLARGINE 100 UNIT/ML ~~LOC~~ SOLN
30.0000 [IU] | Freq: Every day | SUBCUTANEOUS | Status: DC
  Administered 2016-12-21 – 2016-12-22 (×2): 30 [IU] via SUBCUTANEOUS
  Filled 2016-12-21 (×2): qty 0.3

## 2016-12-21 MED ORDER — MAGNESIUM OXIDE 400 (241.3 MG) MG PO TABS
400.0000 mg | ORAL_TABLET | Freq: Two times a day (BID) | ORAL | Status: AC
  Administered 2016-12-21 (×2): 400 mg via ORAL
  Filled 2016-12-21 (×2): qty 1

## 2016-12-21 MED ORDER — OXYCODONE HCL 5 MG PO TABS
5.0000 mg | ORAL_TABLET | Freq: Every day | ORAL | Status: DC
  Administered 2016-12-21 – 2016-12-25 (×23): 5 mg via ORAL
  Filled 2016-12-21 (×24): qty 1

## 2016-12-21 MED ORDER — SENNOSIDES-DOCUSATE SODIUM 8.6-50 MG PO TABS
2.0000 | ORAL_TABLET | Freq: Every day | ORAL | Status: DC
  Administered 2016-12-21 – 2016-12-24 (×4): 2 via ORAL
  Filled 2016-12-21 (×4): qty 2

## 2016-12-21 MED ORDER — VANCOMYCIN HCL IN DEXTROSE 1-5 GM/200ML-% IV SOLN
1000.0000 mg | Freq: Four times a day (QID) | INTRAVENOUS | Status: DC
  Administered 2016-12-21 – 2016-12-22 (×4): 1000 mg via INTRAVENOUS
  Filled 2016-12-21 (×6): qty 200

## 2016-12-21 NOTE — Progress Notes (Addendum)
PROGRESS NOTE    Keith Riggs   ZOX:[id number]  DOB: 21-Aug-1990  DOA: 12/19/2016 PCP: System, Pcp Not In   Brief Narrative:  26 y/o with IDDM, past history of heroin abuse, cocaine abuse who get random drug tests 2 x a month. He is a transfer from [facility] ER. History obtained from Grandmother and father whom he lives with.  He was seen there for a right groin abscess on Wed which was I and D'd. He was seen again on Friday for a packing of the abscess and was found to have a left groin abscess with was I and D'd. He returned to there ER in the after noon because he thought his packing had fallen out. At that time, he complained of back pain and was sent to [facility] for and MRI. This showed thoracic epidural abscesses causing severe spinal cord compression, paraspinal myositis and abscesses, T6 osteo and b/l pleural effusions.   CT chest abd pelvis: 1. Multiple cavitary nodules in both lungs, compatible with septic emboli. 2. Moderate-sized multiloculated left pleural effusion. The multiple loculations are compatible with empyema formation. 3. Previously described epidural and left paraspinal abscesses. 4. Bilateral lung atelectasis, greater on the left. 5. Soft tissue gas in the inferior, medial left buttock region, compatible with the patient's known groin abscesses. 6. Evidence of bilateral pyelonephritis with a small right perinephric abscess or small flattened exophytic cyst. 7. Minimal mediastinal adenopathy, most likely reactive.    Subjective: Having mid back pain this AM and is very uncomfortable. Mild pain around chest tube site. No nausea, vomiting. No there complaints.  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- sodium not improving with Saline infusion - hold NS for now and recheck sodium this evening  Hypokalemia/ hypomagnesemia - cont to replace and follow    DM2 (diabetes mellitus, type 2) - cont Lantus and SSI- dose of Lantus increased today    IVDU (intravenous drug user) - has used IV Cocaine and Heroin in the past - states he has not used in 2-3 months - grandmother states he get random drug tests 2 x month - check UDS   Cigarette smoker - Nicotine patch    Normocytic anemia - anemia >> Low Iron levels and normal Iron binding, retic count normal- Ferritin high likely due to infection - consistent with AOCD  Morbid Obesity  Body mass index is 33.05 kg/m.      DVT prophylaxis: holding anticoagulation for possible neursurgery Code Status: Full code Family Communication: father and grandmother Disposition Plan: SDU Consultants:   ID  CT surgery  Neurosurgery  IR Procedures:  CT guided left chest tube Antimicrobials:  Anti-infectives    Start     Dose/Rate Route Frequency Ordered Stop   12/21/16 2000  vancomycin (VANCOCIN) IVPB 1000 mg/200 mL premix     1,000 mg 200 mL/hr over 60 Minutes Intravenous Every 8 hours 12/21/16 1339     12/20/16 1300  piperacillin-tazobactam (ZOSYN) IVPB 3.375 g  Status:  Discontinued     3.375 g 12.5 mL/hr over 240  Minutes Intravenous Every 8 hours 12/20/16 1109 12/20/16 1214   12/20/16 1300  cefTRIAXone (ROCEPHIN) 2 g in dextrose 5 % 50 mL IVPB     2 g 100 mL/hr over 30 Minutes Intravenous Every 24 hours 12/20/16 1214     12/20/16 0200  vancomycin (VANCOCIN) IVPB 1000 mg/200 mL premix  Status:  Discontinued     1,000 mg 200 mL/hr over 60 Minutes Intravenous Every 8 hours 12/20/16 0024 12/21/16 1339   12/20/16 0200  piperacillin-tazobactam (ZOSYN) IVPB 3.375 g  Status:  Discontinued     3.375 g 12.5 mL/hr over 240 Minutes Intravenous Every 8 hours 12/20/16 0024 12/20/16 1109       Objective: Vitals:   12/21/16 0800 12/21/16 0810 12/21/16 0818 12/21/16 0820  BP: (!) 148/74     Pulse:      Resp: (!) 27 (!) 34 (!) 30 (!) 33  Temp: 99.3 F (37.4 C)     TempSrc: Oral     SpO2: 90% 91% 93% 91%  Weight:      Height:        Intake/Output Summary (Last 24 hours) at 12/21/16 1400 Last data filed at 12/21/16 1130  Gross per 24 hour  Intake          5160.42 ml  Output             6740 ml  Net         -1579.58 ml   Filed Weights   12/19/16 2324  Weight: 107.5 kg (237 lb)    Examination: General exam: Appears uncomfortable  HEENT: PERRLA, oral mucosa moist, no sclera icterus or thrush Respiratory system: Clear to auscultation. RR in mid 20s on my exam. - chest tube draining pink tinged fluid- crackles in left lower lung field Cardiovascular system: S1 & S2 heard, RRR.  No murmurs  Gastrointestinal system: Abdomen soft, non-tender, nondistended. Normal bowel sound. No organomegaly Central nervous system: Alert and oriented. No focal neurological deficits. Extremities: No cyanosis, clubbing or edema Skin: No rashes or ulcers Psychiatry:  Mood & affect appropriate.     Data Reviewed: I have personally reviewed following labs and imaging studies  CBC:  Recent Labs Lab 12/20/16 0011 12/21/16 0242  WBC 26.5* 26.0*  HGB 10.3* 10.7*  HCT 31.6* 32.9*  MCV 82.3 83.9  PLT 529* 544*    Basic Metabolic Panel:  Recent Labs Lab 12/20/16 0011 12/20/16 0043 12/20/16 0909 12/20/16 1939 12/21/16 0242 12/21/16 0840  NA 125* 126* 127* 127* 127*  --   K 3.6 3.5 3.6 3.2* 3.0*  --   CL 95* 95* 95* 93* 93*  --   CO2 20* 21* 20* 25 27  --   GLUCOSE 199* 191* 195* 250* 220*  --   BUN 7 8 5* <5* <5*  --   CREATININE 0.56* 0.63 0.64 0.55* 0.49*  --   CALCIUM 7.7* 7.8* 7.7* 7.8* 7.9*  --   MG  --   --   --   --   --  1.6*  GFR: Estimated Creatinine Clearance: 171.5 mL/min (A) (by C-G formula based on SCr of 0.49 mg/dL (L)). Liver Function Tests:  Recent Labs Lab 12/20/16 0909  PROT 5.9*   No results for input(s): LIPASE, AMYLASE in the last 168 hours. No results for input(s): AMMONIA in the last 168 hours. Coagulation Profile: No results for input(s): INR, PROTIME in the last 168 hours. Cardiac Enzymes: No results for input(s): CKTOTAL, CKMB, CKMBINDEX, TROPONINI in the last 168 hours. BNP (last 3 results) No results for input(s): PROBNP in the last 8760 hours. HbA1C: No results for input(s): HGBA1C in the last 72 hours. CBG:  Recent Labs Lab 12/20/16 2017 12/21/16 0000 12/21/16 0412 12/21/16 0821 12/21/16 1109  GLUCAP 222* 214* 285* 271* 249*   Lipid Profile: No results for input(s): CHOL, HDL, LDLCALC, TRIG, CHOLHDL, LDLDIRECT in the last 72 hours. Thyroid Function Tests: No results for input(s): TSH, T4TOTAL, FREET4, T3FREE, THYROIDAB in the last 72 hours. Anemia Panel:  Recent Labs  12/20/16 1939 12/21/16 0242  VITAMINB12  --  397  FOLATE  --  9.8  FERRITIN 1,249*  --   TIBC 132*  --   IRON 17*  --   RETICCTPCT 0.7  --    Urine analysis: No results found for: COLORURINE, APPEARANCEUR, LABSPEC, PHURINE, GLUCOSEU, HGBUR, BILIRUBINUR, KETONESUR, PROTEINUR, UROBILINOGEN, NITRITE, LEUKOCYTESUR Sepsis Labs: @LABRCNTIP (procalcitonin:4,lacticidven:4) ) Recent Results (from the past 240 hour(s))  Aerobic/Anaerobic Culture (surgical/deep wound)      Status: None (Preliminary result)   Collection Time: 12/20/16  4:34 PM  Result Value Ref Range Status   Specimen Description WOUND PLEURAL LEFT  Final   Special Requests Normal  Final   Gram Stain   Final    ABUNDANT WBC PRESENT, PREDOMINANTLY PMN MODERATE GRAM POSITIVE COCCI IN CLUSTERS    Culture ABUNDANT STAPHYLOCOCCUS AUREUS  Final   Report Status PENDING  Incomplete  MRSA PCR Screening     Status: Abnormal   Collection Time: 12/21/16  7:59 AM  Result Value Ref Range Status   MRSA by PCR POSITIVE (A) NEGATIVE Final    Comment:        The GeneXpert MRSA Assay (FDA approved for NASAL specimens only), is one component of a comprehensive MRSA colonization surveillance program. It is not intended to diagnose MRSA infection nor to guide or monitor treatment for MRSA infections. RESULT CALLED TO, READ BACK BY AND VERIFIED WITH: T.MAINIERL RN AT 1025 12/21/16 BY A.DAVIS          Radiology Studies: Ct Chest W Contrast  Result Date: 12/20/2016 CLINICAL DATA:  Worsening left back pain over the past 2 weeks with nausea and vomiting. Thoracic spine MR earlier today demonstrating multifocal epidural abscess with paraspinal myositis and paraspinal muscle abscesses and bilateral pleural empyema with probable pneumonia and possible lung abscesses. Chest CT was recommended. 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 EXAM: CT CHEST, ABDOMEN, AND PELVIS WITH CONTRAST TECHNIQUE: Multidetector CT imaging of the chest, abdomen and pelvis was performed following the standard protocol during bolus administration of intravenous contrast. CONTRAST:  ISOVUE-300 IOPAMIDOL (ISOVUE-300) INJECTION 61% COMPARISON:  Thoracic spine MR obtained earlier today. FINDINGS: CT CHEST FINDINGS Cardiovascular: No significant vascular findings. Normal heart size. No pericardial effusion. Mediastinum/Nodes: Prominent AP window lymph node with a short axis diameter of 11 mm on image number 27 of  series 3. Mildly prominent precarinal node with a short axis diameter of 8 mm on image number 46 of series 3. Lungs/Pleura: Large number of bilateral  lung nodules, most numerous on the right. Some of these demonstrate central cavitation. The largest on the left is at the medial left lung apex, measuring 1.7 cm in maximum diameter on image number 39 of series 4. The largest on the right is in the posterior aspect of the right upper lobe, with a maximum diameter of 1.6 cm on image number 53 of series 4. Small right pleural effusion and mild associated right lower lobe atelectasis. Moderate-sized multiloculated left pleural effusion and moderate compressive and dependent atelectasis. Musculoskeletal: Previously described left paraspinal muscle abscess. This is largest on image number 32 of series 3, measuring 3.9 x 2.3 cm on that image. There is a more discrete abscess with rim enhancement on the left on image number 36 of series 3, measuring 2.0 x 1.4 cm. The previously demonstrated multifocal epidural abscess is better visualized on the MR images. No bone destruction seen. Mild thoracic and lower cervical spine degenerative changes. CT ABDOMEN PELVIS FINDINGS Hepatobiliary: No focal liver abnormality is seen. No gallstones, gallbladder wall thickening, or biliary dilatation. Pancreas: Unremarkable. No pancreatic ductal dilatation or surrounding inflammatory changes. Spleen: Normal in size without focal abnormality. Adrenals/Urinary Tract: Both kidneys are mildly enlarged with poorly defined areas low density. There is a very small medial right perinephric fluid collection measuring 1.9 x 1.1 cm on image number 76 series 3. Minimal bilateral perinephric soft tissue stranding. Unremarkable ureters, urinary bladder and adrenal glands. No hydronephrosis. Stomach/Bowel: Stomach is within normal limits. Appendix appears normal. No evidence of bowel wall thickening, distention, or inflammatory changes. Vascular/Lymphatic:  No significant vascular findings are present. No enlarged abdominal or pelvic lymph nodes. Reproductive: Prostate is unremarkable. Other: Very small bilateral inguinal hernias containing fat. Musculoskeletal: The left paraspinal muscle abscesses extend to the level of the upper abdomen. The largest portion of the abscess at that level measures 4.3 x 2.7 cm on image number 58 of series 3. No bone destruction is seen. An L1 vertebral body hemangioma is incidentally noted. There is soft tissue gas in the inferior, medial left buttock region subcutaneous fat on the most inferior images with no fluid collection seen on those images. IMPRESSION: 1. Multiple cavitary nodules in both lungs, compatible with septic emboli. 2. Moderate-sized multiloculated left pleural effusion. The multiple loculations are compatible with empyema formation. 3. Previously described epidural and left paraspinal abscesses. 4. Bilateral lung atelectasis, greater on the left. 5. Soft tissue gas in the inferior, medial left buttock region, compatible with the patient's known groin abscesses. 6. Evidence of bilateral pyelonephritis with a small right perinephric abscess or small flattened exophytic cyst. 7. Minimal mediastinal adenopathy, most likely reactive. Electronically Signed   By: Beckie Salts M.D.   On: 12/20/2016 09:11   Mr Thoracic Spine W Wo Contrast  Result Date: 12/20/2016 CLINICAL DATA:  Severe mid back pain for 2 weeks. History of diabetes , intravenous drug use and recent groin abscess. EXAM: MRI THORACIC WITHOUT AND WITH CONTRAST TECHNIQUE: Multiplanar and multiecho pulse sequences of the thoracic spine were obtained without and with intravenous contrast. CONTRAST:  20mL MULTIHANCE GADOBENATE DIMEGLUMINE 529 MG/ML IV SOLN COMPARISON:  Thoracic spine radiograph December 15, 2016 FINDINGS: Generally poor signal to noise ratio due to habitus and motion. The axial sequences of the lower thoracic spine are nondiagnostic. ALIGNMENT:  Straightened thoracic kyphosis. No malalignment. VERTEBRAE/DISCS: Vertebral bodies are intact. Intervertebral discs morphology and signal are normal. Faintly increased STIR signal and enhancement T6 vertebral body. No abnormal disc enhancement. CORD: 1.7 x 1.3 x  7.8 cm (transverse by AP by CC) ventral epidural abscess from T4-5 through T8-9 resulting in severe cord compression T5-6 through T7 nondiagnostic examination for spinal cord edema. No syrinx. LEFT ventral epidural abscess from T2 to T5-6, 7.1 cm in cranial caudad dimension resulting in mild cord compression. PREVERTEBRAL AND PARASPINAL SOFT TISSUES: Partially imaged lobulated, potentially loculated LEFT pleural effusion. Patchy LEFT lung consolidation. T2 bright signal within the bilateral paraspinal muscles compatible with mild bursitis. Complex 4.5 x 2.5 x 13.8 cm LEFT paraspinal muscle abscess from T5 through T12. 0.8 x 0.8 x 8.3 cm RIGHT paraspinal muscle abscess at the level of the epidural collection. Prevertebral subpleural abscesses bilaterally measuring to at least 1.4 x 2.4 cm on the LEFT. IMPRESSION: 1. Limited examination, nondiagnostic evaluation through the lower thoracic spine. 2. 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 No syrinx. 3. 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 4. Paraspinal myositis with paraspinal muscle abscesses, LEFT greater than RIGHT. 5. Bilateral pleural empyema and, probable pneumonia. Lung abscess is possible. Recommend CT chest with contrast. Critical Value/emergent results were called by telephone at the time of interpretation on 12/20/2016 at 5:21 am to Dr. Azalia Bilis, ED, who verbally acknowledged these results. Electronically Signed   By: Awilda Metro M.D.   On: 12/20/2016 05:25   Ct Abdomen Pelvis W Contrast  Result Date: 12/20/2016 CLINICAL DATA:  Worsening left back pain over the past 2 weeks with  nausea and vomiting. Thoracic spine MR earlier today demonstrating multifocal epidural abscess with paraspinal myositis and paraspinal muscle abscesses and bilateral pleural empyema with probable pneumonia and possible lung abscesses. Chest CT was recommended. 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 EXAM: CT CHEST, ABDOMEN, AND PELVIS WITH CONTRAST TECHNIQUE: Multidetector CT imaging of the chest, abdomen and pelvis was performed following the standard protocol during bolus administration of intravenous contrast. CONTRAST:  ISOVUE-300 IOPAMIDOL (ISOVUE-300) INJECTION 61% COMPARISON:  Thoracic spine MR obtained earlier today. FINDINGS: CT CHEST FINDINGS Cardiovascular: No significant vascular findings. Normal heart size. No pericardial effusion. Mediastinum/Nodes: Prominent AP window lymph node with a short axis diameter of 11 mm on image number 27 of series 3. Mildly prominent precarinal node with a short axis diameter of 8 mm on image number 46 of series 3. Lungs/Pleura: Large number of bilateral lung nodules, most numerous on the right. Some of these demonstrate central cavitation. The largest on the left is at the medial left lung apex, measuring 1.7 cm in maximum diameter on image number 39 of series 4. The largest on the right is in the posterior aspect of the right upper lobe, with a maximum diameter of 1.6 cm on image number 53 of series 4. Small right pleural effusion and mild associated right lower lobe atelectasis. Moderate-sized multiloculated left pleural effusion and moderate compressive and dependent atelectasis. Musculoskeletal: Previously described left paraspinal muscle abscess. This is largest on image number 32 of series 3, measuring 3.9 x 2.3 cm on that image. There is a more discrete abscess with rim enhancement on the left on image number 36 of series 3, measuring 2.0 x 1.4 cm. The previously demonstrated multifocal epidural abscess is better visualized on the MR  images. No bone destruction seen. Mild thoracic and lower cervical spine degenerative changes. CT ABDOMEN PELVIS FINDINGS Hepatobiliary: No focal liver abnormality is seen. No gallstones, gallbladder wall thickening, or biliary dilatation. Pancreas: Unremarkable. No pancreatic ductal dilatation or  surrounding inflammatory changes. Spleen: Normal in size without focal abnormality. Adrenals/Urinary Tract: Both kidneys are mildly enlarged with poorly defined areas low density. There is a very small medial right perinephric fluid collection measuring 1.9 x 1.1 cm on image number 76 series 3. Minimal bilateral perinephric soft tissue stranding. Unremarkable ureters, urinary bladder and adrenal glands. No hydronephrosis. Stomach/Bowel: Stomach is within normal limits. Appendix appears normal. No evidence of bowel wall thickening, distention, or inflammatory changes. Vascular/Lymphatic: No significant vascular findings are present. No enlarged abdominal or pelvic lymph nodes. Reproductive: Prostate is unremarkable. Other: Very small bilateral inguinal hernias containing fat. Musculoskeletal: The left paraspinal muscle abscesses extend to the level of the upper abdomen. The largest portion of the abscess at that level measures 4.3 x 2.7 cm on image number 58 of series 3. No bone destruction is seen. An L1 vertebral body hemangioma is incidentally noted. There is soft tissue gas in the inferior, medial left buttock region subcutaneous fat on the most inferior images with no fluid collection seen on those images. IMPRESSION: 1. Multiple cavitary nodules in both lungs, compatible with septic emboli. 2. Moderate-sized multiloculated left pleural effusion. The multiple loculations are compatible with empyema formation. 3. Previously described epidural and left paraspinal abscesses. 4. Bilateral lung atelectasis, greater on the left. 5. Soft tissue gas in the inferior, medial left buttock region, compatible with the patient's  known groin abscesses. 6. Evidence of bilateral pyelonephritis with a small right perinephric abscess or small flattened exophytic cyst. 7. Minimal mediastinal adenopathy, most likely reactive. Electronically Signed   By: Beckie Salts M.D.   On: 12/20/2016 09:11   Dg Chest Port 1 View  Result Date: 12/21/2016 CLINICAL DATA:  Empyema. EXAM: PORTABLE CHEST 1 VIEW COMPARISON:  CT 12/20/2016 FINDINGS: Loculated left pleural effusion again noted, similar to prior CT. Left pleural pigtail drainage catheter noted. No pneumothorax. Diffuse bilateral airspace disease. Cardiomegaly. IMPRESSION: Large loculated left pleural effusion with left pleural drainage catheter in place. No pneumothorax. Bilateral airspace disease. Electronically Signed   By: Charlett Nose M.D.   On: 12/21/2016 10:04   Ct Image Guided Fluid Drain By Catheter  Result Date: 12/20/2016 INDICATION: Left empyema EXAM: CT GUIDED 14 FRENCH LEFT CHEST TUBE INSERTION MEDICATIONS: The patient is currently admitted to the hospital and receiving intravenous antibiotics. The antibiotics were administered within an appropriate time frame prior to the initiation of the procedure. ANESTHESIA/SEDATION: Fentanyl 100 mcg IV; Versed 2.0 mg IV Moderate Sedation Time:  10 minutes The patient was continuously monitored during the procedure by the interventional radiology nurse under my direct supervision. COMPLICATIONS: None immediate. PROCEDURE: Informed written consent was obtained from the patient after a thorough discussion of the procedural risks, benefits and alternatives. All questions were addressed. Maximal Sterile Barrier Technique was utilized including caps, mask, sterile gowns, sterile gloves, sterile drape, hand hygiene and skin antiseptic. A timeout was performed prior to the initiation of the procedure. Previous imaging reviewed. Patient positioned slightly left anterior oblique. Noncontrast localization CT performed. The loculated complex left pleural  effusion was localized. A mid intercostal space was marked in the mid axillary line. Under sterile conditions and local anesthesia, an 18 gauge 10 cm access needle was advanced under CT guidance into the left chest fluid collection. Needle position confirmed with CT. Syringe aspiration yielded purulent fluid. Amplatz guidewire inserted followed by tract dilatation to insert 14 [place] drain. Drain catheter position confirmed with CT. Catheter secured with a Prolene suture and connected to Pleur-Evac externally. Sterile dressing applied.  Patient tolerated the procedure well. No immediate complication. IMPRESSION: Successful CT-guided left chest 14 French drain for empyema. Culture sent. Electronically Signed   By: Judie PetitM.  Shick M.D.   On: 12/20/2016 16:30      Scheduled Meds: . insulin aspart  0-15 Units Subcutaneous Q4H  . insulin glargine  30 Units Subcutaneous Daily  . magnesium oxide  400 mg Oral BID  . oxyCODONE  5 mg Oral 6 X Daily  . senna-docusate  2 tablet Oral Daily   Continuous Infusions: . cefTRIAXone (ROCEPHIN)  IV 2 g (12/20/16 1300)  . vancomycin       LOS: 1 day    Time spent in minutes: 60 min at minimal in intervals from 7:30 AM until now 5: 25 PM - greater than 50 % of time spent speaking with patient, father grandmother, multiple nurses, the physician who admitting him, [facility], calling consults and following up imaging and labs.     Calvert CantorSaima Jaycelynn Knickerbocker, MD [facility] Pager: [email or web address] Password [email or web address] 12/21/2016, 2:00 PM

## 2016-12-21 NOTE — Progress Notes (Signed)
Pharmacy Antibiotic Note  Keith Riggs is a 26 y.o. male admitted on 12/19/2016 with pneumonia and sepsis.  Pharmacy has been consulted for Vancocin and Zosyn dosing.  Pt had been on Keflex after having abscesses drained this week.  Concern for epidural abscess, with MRI confirmed osteomylitis. ID following the patient and he is currently on vancomycin and ceftriaxone. Vancomycin trough resulted <4 on 1000mg  IV q8 hour regimen. Given patient's age, renal function, and infection severity, the dosing interval was changed to every 6 hours.  Plan: Vancomycin 1000mg  IV q6 hours Goal trough 15-20 mcg/mL. Ceftriaxone 2g IV q24 hours F/U clinical progression and culture/sensitivities  Height: 5\' 11"  (180.3 cm) Weight: 237 lb (107.5 kg) IBW/kg (Calculated) : 75.3  Temp (24hrs), Avg:98.6 F (37 C), Min:97.2 F (36.2 C), Max:99.6 F (37.6 C)   Allergies  Allergen Reactions  . Bee Venom Anaphylaxis  . Voltaren [Diclofenac Sodium] Other (See Comments)    "Makes me feel not myself"  . Latex Rash   Zosyn 7/14 >> x1 Vancomycin 7/14 CFTX 7/14  7/15 MRSA PCR: Positive 7/14 Wound Cx: staph aureus 7/14 BCX: Gram + cocci  Thank you for allowing pharmacy to be a part of this patient's care.  Keith Riggs, PharmD Clinical Pharmacist 12/21/2016 9:12 PM

## 2016-12-21 NOTE — Progress Notes (Signed)
Patient ID: Keith Riggs, male   DOB: [birth year], 26 y.o.   MRN: [id number]          [facility]  Date of Admission:  12/19/2016   Total days of antibiotics 5        Day 2 vancomycin        Day 2 ceftriaxone         ASSESSMENT: Both sets of blood cultures at [facility] are growing gram-positive cocci in clusters indicative of staph bacteremia. He has widely disseminated infection with thoracic osteomyelitis, epidural abscess, cavitary lung nodules and probable pleural empyemas, and a right perinephric abscess. No vegetations were seen on transthoracic echocardiogram. We can consider transesophageal echocardiogram although it will not change decision making about length of therapy as he will already need a prolonged course of antibiotic.  PLAN: 1. Continue vancomycin 2. Discontinue ceftriaxone 3. 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 insulin aspart  0-15 Units Subcutaneous Q4H  . insulin glargine  30 Units Subcutaneous Daily  . magnesium oxide  400 mg Oral BID  . oxyCODONE  5 mg Oral 6 X Daily  . potassium chloride  40 mEq Oral Q4H  . senna-docusate  2 tablet Oral Daily    SUBJECTIVE: He is having severe back pain. He says that he last injected any drugs 3 months ago. He says he only started injecting cocaine and heroin early this year.  Review of Systems: Review of Systems  Constitutional: Positive for malaise/fatigue. Negative for chills, diaphoresis and fever.  Respiratory:  Positive for cough. Negative for sputum production and shortness of breath.   Cardiovascular: Positive for chest pain.  Gastrointestinal: Negative for abdominal pain, diarrhea, nausea and vomiting.  Genitourinary: Negative for dysuria.  Musculoskeletal: Positive for back pain.    Allergies  Allergen Reactions  . Bee Venom Anaphylaxis  . Voltaren [Diclofenac Sodium] Other (See Comments)    "Makes me feel not myself"  . Latex Rash    OBJECTIVE: Vitals:   12/21/16 0800 12/21/16 0810 12/21/16 0818 12/21/16 0820  BP: (!) 148/74     Pulse:      Resp: (!) 27 (!) 34 (!) 30 (!) 33  Temp: 99.3 F (37.4 C)     TempSrc: Oral     SpO2: 90% 91% 93% 91%  Weight:      Height:       Body mass index is 33.05 kg/m.  Physical Exam  Constitutional: He is oriented to person, place, and time.  Uncomfortable due to pain.  Cardiovascular: Regular rhythm.   No murmur heard. Tachycardic.  Pulmonary/Chest: Effort normal and breath sounds normal. He has no wheezes. He has no rales.  Abdominal: Soft. There is no tenderness.  Neurological: He is alert and oriented to person, place, and time.  Skin: No rash noted.    Lab Results Lab Results  Component Value Date   WBC 26.0 (H) 12/21/2016   HGB 10.7 (L) 12/21/2016   HCT 32.9 (  L) 12/21/2016   MCV 83.9 12/21/2016   PLT 544 (H) 12/21/2016    Lab Results  Component Value Date   CREATININE 0.49 (L) 12/21/2016   BUN <5 (L) 12/21/2016   NA 127 (L) 12/21/2016   K 3.0 (L) 12/21/2016   CL 93 (L) 12/21/2016   CO2 27 12/21/2016   No results found for: ALT, AST, GGT, ALKPHOS, BILITOT   Microbiology: Recent Results (from the past 240 hour(s))  Aerobic/Anaerobic Culture (surgical/deep wound)     Status: None (Preliminary result)   Collection Time: 12/20/16  4:34 PM  Result Value Ref Range Status   Specimen Description WOUND PLEURAL LEFT  Final   Special Requests Normal  Final   Gram Stain   Final    ABUNDANT WBC PRESENT, PREDOMINANTLY  PMN MODERATE GRAM POSITIVE COCCI IN CLUSTERS    Culture PENDING  Incomplete   Report Status PENDING  Incomplete    Cliffton AstersJohn Cailan General, MD [facility] [phone]   [phone] cell 12/21/2016, 9:46 AM

## 2016-12-21 NOTE — Progress Notes (Signed)
Patient ID: Keith Riggs, male   DOB: 1991-03-10, 26 y.o.   MRN: [id number]      [address]       Keith Riggs [id number]             [phone]                     LOS: 1 day   Subjective: Feels better, staph growing from culture so far   Objective: Vital signs in last 24 hours: Patient Vitals for the past 24 hrs:  BP Temp Temp src Pulse Resp SpO2  12/21/16 0820 - - - - (!) 33 91 %  12/21/16 0818 - - - - (!) 30 93 %  12/21/16 0810 - - - - (!) 34 91 %  12/21/16 0800 (!) 148/74 99.3 F (37.4 C) Oral - (!) 27 90 %  12/21/16 0400 (!) 160/78 (!) 97.2 F (36.2 C) Oral - (!) 32 92 %  12/21/16 0000 (!) 145/87 98.6 F (37 C) Oral - (!) 27 96 %  12/20/16 2000 127/77 99 F (37.2 C) - - (!) 27 94 %  12/20/16 1800 - 99 F (37.2 C) Oral - (!) 29 93 %  12/20/16 1700 136/83 (!) 101.9 F (38.8 C) Oral - (!) 37 93 %  12/20/16 1630 (!) 141/70 - - (!) 116 (!) 32 93 %  12/20/16 1625 (!) 142/69 - - (!) 113 (!) 42 94 %  12/20/16 1620 131/62 - - (!) 113 (!) 26 94 %  12/20/16 1615 (!) 134/55 - - (!) 117 (!) 35 93 %  12/20/16 1610 139/64 - - (!) 117 (!) 32 93 %  12/20/16 1605 135/66 - - (!) 114 (!) 36 93 %  12/20/16 1603 (!) 142/64 - - (!) 113 (!) 31 94 %  12/20/16 1500 (!) 147/93 - - - (!) 39 92 %  12/20/16 1358 138/70 100.3 F (37.9 C) Oral - (!) 29 94 %  12/20/16 1330 (!) 145/71 - - (!) 120 (!) 26 91 %  12/20/16 1315 133/68 - - (!) 117 (!) 41 (!) 89 %  12/20/16 1300 128/67 - - (!) 117 (!) 30 91 %  12/20/16 1230 124/68 - - (!) 120 (!) 28 92 %    Filed Weights   12/19/16 2324  Weight: 237 lb (107.5 kg)    Hemodynamic parameters for last 24 hours:    Intake/Output from previous day: 07/14 0701 - 07/15 0700 In: 4920.4 [P.O.:1260; I.V.:3010.4; IV Piggyback:650] Out: 4640 [Urine:4150; Drains:490] Intake/Output this shift: Total I/O In: 240 [P.O.:240] Out: 2100 [Urine:2100]  Scheduled Meds: . insulin aspart  0-15 Units Subcutaneous Q4H  . insulin glargine  30  Units Subcutaneous Daily  . magnesium oxide  400 mg Oral BID  . oxyCODONE  5 mg Oral 6 X Daily  . senna-docusate  2 tablet Oral Daily   Continuous Infusions: . cefTRIAXone (ROCEPHIN)  IV 2 g (12/20/16 1300)  . vancomycin Stopped (12/21/16 1217)   PRN Meds:.acetaminophen, HYDROmorphone (DILAUDID) injection, ondansetron (ZOFRAN) IV  General appearance: alert and cooperative Neurologic: intact Heart: regular rate and rhythm, S1, S2 normal, no murmur, click, rub or gallop Lungs: diminished breath sounds LLL Abdomen: soft, non-tender; bowel sounds normal; no masses,  no organomegaly Extremities: extremities normal, atraumatic, no cyanosis or edema and Homans sign is negative, no sign of DVT Left chest tube in place Lab Results: CBC: Recent Labs  12/20/16 0011 12/21/16 0242  WBC 26.5* 26.0*  HGB  10.3* 10.7*  HCT 31.6* 32.9*  PLT 529* 544*   BMET:  Recent Labs  12/20/16 1939 12/21/16 0242  NA 127* 127*  K 3.2* 3.0*  CL 93* 93*  CO2 25 27  GLUCOSE 250* 220*  BUN <5* <5*  CREATININE 0.55* 0.49*  CALCIUM 7.8* 7.9*    PT/INR: No results for input(s): LABPROT, INR in the last 72 hours.   Radiology Ct Chest W Contrast  Result Date: 12/20/2016 CLINICAL DATA:  Worsening left back pain over the past 2 weeks with nausea and vomiting. Thoracic spine MR earlier today demonstrating multifocal epidural abscess with paraspinal myositis and paraspinal muscle abscesses and bilateral pleural empyema with probable pneumonia and possible lung abscesses. Chest CT was recommended. 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 EXAM: CT CHEST, ABDOMEN, AND PELVIS WITH CONTRAST TECHNIQUE: Multidetector CT imaging of the chest, abdomen and pelvis was performed following the standard protocol during bolus administration of intravenous contrast. CONTRAST:  100mL ISOVUE-300 IOPAMIDOL (ISOVUE-300) INJECTION 61% COMPARISON:  Thoracic spine MR obtained earlier today. FINDINGS: CT CHEST  FINDINGS Cardiovascular: No significant vascular findings. Normal heart size. No pericardial effusion. Mediastinum/Nodes: Prominent AP window lymph node with a short axis diameter of 11 mm on image number 27 of series 3. Mildly prominent precarinal node with a short axis diameter of 8 mm on image number 46 of series 3. Lungs/Pleura: Large number of bilateral lung nodules, most numerous on the right. Some of these demonstrate central cavitation. The largest on the left is at the medial left lung apex, measuring 1.7 cm in maximum diameter on image number 39 of series 4. The largest on the right is in the posterior aspect of the right upper lobe, with a maximum diameter of 1.6 cm on image number 53 of series 4. Small right pleural effusion and mild associated right lower lobe atelectasis. Moderate-sized multiloculated left pleural effusion and moderate compressive and dependent atelectasis. Musculoskeletal: Previously described left paraspinal muscle abscess. This is largest on image number 32 of series 3, measuring 3.9 x 2.3 cm on that image. There is a more discrete abscess with rim enhancement on the left on image number 36 of series 3, measuring 2.0 x 1.4 cm. The previously demonstrated multifocal epidural abscess is better visualized on the MR images. No bone destruction seen. Mild thoracic and lower cervical spine degenerative changes. CT ABDOMEN PELVIS FINDINGS Hepatobiliary: No focal liver abnormality is seen. No gallstones, gallbladder wall thickening, or biliary dilatation. Pancreas: Unremarkable. No pancreatic ductal dilatation or surrounding inflammatory changes. Spleen: Normal in size without focal abnormality. Adrenals/Urinary Tract: Both kidneys are mildly enlarged with poorly defined areas low density. There is a very small medial right perinephric fluid collection measuring 1.9 x 1.1 cm on image number 76 series 3. Minimal bilateral perinephric soft tissue stranding. Unremarkable ureters, urinary  bladder and adrenal glands. No hydronephrosis. Stomach/Bowel: Stomach is within normal limits. Appendix appears normal. No evidence of bowel wall thickening, distention, or inflammatory changes. Vascular/Lymphatic: No significant vascular findings are present. No enlarged abdominal or pelvic lymph nodes. Reproductive: Prostate is unremarkable. Other: Very small bilateral inguinal hernias containing fat. Musculoskeletal: The left paraspinal muscle abscesses extend to the level of the upper abdomen. The largest portion of the abscess at that level measures 4.3 x 2.7 cm on image number 58 of series 3. No bone destruction is seen. An L1 vertebral body hemangioma is incidentally noted. There is soft tissue gas in the inferior, medial left buttock region subcutaneous fat on the  most inferior images with no fluid collection seen on those images. IMPRESSION: 1. Multiple cavitary nodules in both lungs, compatible with septic emboli. 2. Moderate-sized multiloculated left pleural effusion. The multiple loculations are compatible with empyema formation. 3. Previously described epidural and left paraspinal abscesses. 4. Bilateral lung atelectasis, greater on the left. 5. Soft tissue gas in the inferior, medial left buttock region, compatible with the patient's known groin abscesses. 6. Evidence of bilateral pyelonephritis with a small right perinephric abscess or small flattened exophytic cyst. 7. Minimal mediastinal adenopathy, most likely reactive. Electronically Signed   By: Beckie Salts M.D.   On: 12/20/2016 09:11   Mr Thoracic Spine W Wo Contrast  Result Date: 12/20/2016 CLINICAL DATA:  Severe mid back pain for 2 weeks. History of diabetes , intravenous drug use and recent groin abscess. EXAM: MRI THORACIC WITHOUT AND WITH CONTRAST TECHNIQUE: Multiplanar and multiecho pulse sequences of the thoracic spine were obtained without and with intravenous contrast. CONTRAST:  20mL MULTIHANCE GADOBENATE DIMEGLUMINE 529 MG/ML IV  SOLN COMPARISON:  Thoracic spine radiograph December 15, 2016 FINDINGS: Generally poor signal to noise ratio due to habitus and motion. The axial sequences of the lower thoracic spine are nondiagnostic. ALIGNMENT: Straightened thoracic kyphosis. No malalignment. VERTEBRAE/DISCS: Vertebral bodies are intact. Intervertebral discs morphology and signal are normal. Faintly increased STIR signal and enhancement T6 vertebral body. No abnormal disc enhancement. CORD: 1.7 x 1.3 x 7.8 cm (transverse by AP by CC) ventral epidural abscess from T4-5 through T8-9 resulting in severe cord compression T5-6 through T7 nondiagnostic examination for spinal cord edema. No syrinx. LEFT ventral epidural abscess from T2 to T5-6, 7.1 cm in cranial caudad dimension resulting in mild cord compression. PREVERTEBRAL AND PARASPINAL SOFT TISSUES: Partially imaged lobulated, potentially loculated LEFT pleural effusion. Patchy LEFT lung consolidation. T2 bright signal within the bilateral paraspinal muscles compatible with mild bursitis. Complex 4.5 x 2.5 x 13.8 cm LEFT paraspinal muscle abscess from T5 through T12. 0.8 x 0.8 x 8.3 cm RIGHT paraspinal muscle abscess at the level of the epidural collection. Prevertebral subpleural abscesses bilaterally measuring to at least 1.4 x 2.4 cm on the LEFT. IMPRESSION: 1. Limited examination, nondiagnostic evaluation through the lower thoracic spine. 2. 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 No syrinx. 3. 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 4. Paraspinal myositis with paraspinal muscle abscesses, LEFT greater than RIGHT. 5. Bilateral pleural empyema and, probable pneumonia. Lung abscess is possible. Recommend CT chest with contrast. Critical Value/emergent results were called by telephone at the time of interpretation on 12/20/2016 at 5:21 am to Dr. Azalia Bilis, ED, who verbally acknowledged these  results. Electronically Signed   By: Awilda Metro M.D.   On: 12/20/2016 05:25   Ct Abdomen Pelvis W Contrast  Result Date: 12/20/2016 CLINICAL DATA:  Worsening left back pain over the past 2 weeks with nausea and vomiting. Thoracic spine MR earlier today demonstrating multifocal epidural abscess with paraspinal myositis and paraspinal muscle abscesses and bilateral pleural empyema with probable pneumonia and possible lung abscesses. Chest CT was recommended. 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 EXAM: CT CHEST, ABDOMEN, AND PELVIS WITH CONTRAST TECHNIQUE: Multidetector CT imaging of the chest, abdomen and pelvis was performed following the standard protocol during bolus administration of intravenous contrast. CONTRAST:  ISOVUE-300 IOPAMIDOL (ISOVUE-300) INJECTION 61% COMPARISON:  Thoracic spine MR obtained earlier today. FINDINGS: CT CHEST FINDINGS Cardiovascular: No significant vascular  findings. Normal heart size. No pericardial effusion. Mediastinum/Nodes: Prominent AP window lymph node with a short axis diameter of 11 mm on image number 27 of series 3. Mildly prominent precarinal node with a short axis diameter of 8 mm on image number 46 of series 3. Lungs/Pleura: Large number of bilateral lung nodules, most numerous on the right. Some of these demonstrate central cavitation. The largest on the left is at the medial left lung apex, measuring 1.7 cm in maximum diameter on image number 39 of series 4. The largest on the right is in the posterior aspect of the right upper lobe, with a maximum diameter of 1.6 cm on image number 53 of series 4. Small right pleural effusion and mild associated right lower lobe atelectasis. Moderate-sized multiloculated left pleural effusion and moderate compressive and dependent atelectasis. Musculoskeletal: Previously described left paraspinal muscle abscess. This is largest on image number 32 of series 3, measuring 3.9 x 2.3 cm on that image.  There is a more discrete abscess with rim enhancement on the left on image number 36 of series 3, measuring 2.0 x 1.4 cm. The previously demonstrated multifocal epidural abscess is better visualized on the MR images. No bone destruction seen. Mild thoracic and lower cervical spine degenerative changes. CT ABDOMEN PELVIS FINDINGS Hepatobiliary: No focal liver abnormality is seen. No gallstones, gallbladder wall thickening, or biliary dilatation. Pancreas: Unremarkable. No pancreatic ductal dilatation or surrounding inflammatory changes. Spleen: Normal in size without focal abnormality. Adrenals/Urinary Tract: Both kidneys are mildly enlarged with poorly defined areas low density. There is a very small medial right perinephric fluid collection measuring 1.9 x 1.1 cm on image number 76 series 3. Minimal bilateral perinephric soft tissue stranding. Unremarkable ureters, urinary bladder and adrenal glands. No hydronephrosis. Stomach/Bowel: Stomach is within normal limits. Appendix appears normal. No evidence of bowel wall thickening, distention, or inflammatory changes. Vascular/Lymphatic: No significant vascular findings are present. No enlarged abdominal or pelvic lymph nodes. Reproductive: Prostate is unremarkable. Other: Very small bilateral inguinal hernias containing fat. Musculoskeletal: The left paraspinal muscle abscesses extend to the level of the upper abdomen. The largest portion of the abscess at that level measures 4.3 x 2.7 cm on image number 58 of series 3. No bone destruction is seen. An L1 vertebral body hemangioma is incidentally noted. There is soft tissue gas in the inferior, medial left buttock region subcutaneous fat on the most inferior images with no fluid collection seen on those images. IMPRESSION: 1. Multiple cavitary nodules in both lungs, compatible with septic emboli. 2. Moderate-sized multiloculated left pleural effusion. The multiple loculations are compatible with empyema formation. 3.  Previously described epidural and left paraspinal abscesses. 4. Bilateral lung atelectasis, greater on the left. 5. Soft tissue gas in the inferior, medial left buttock region, compatible with the patient's known groin abscesses. 6. Evidence of bilateral pyelonephritis with a small right perinephric abscess or small flattened exophytic cyst. 7. Minimal mediastinal adenopathy, most likely reactive. Electronically Signed   By: Beckie Salts M.D.   On: 12/20/2016 09:11   Dg Chest Port 1 View  Result Date: 12/21/2016 CLINICAL DATA:  Empyema. EXAM: PORTABLE CHEST 1 VIEW COMPARISON:  CT 12/20/2016 FINDINGS: Loculated left pleural effusion again noted, similar to prior CT. Left pleural pigtail drainage catheter noted. No pneumothorax. Diffuse bilateral airspace disease. Cardiomegaly. IMPRESSION: Large loculated left pleural effusion with left pleural drainage catheter in place. No pneumothorax. Bilateral airspace disease. Electronically Signed   By: Charlett Nose M.D.   On: 12/21/2016 10:04  Ct Image Guided Fluid Drain By Catheter  Result Date: 12/20/2016 INDICATION: Left empyema EXAM: CT GUIDED 14 FRENCH LEFT CHEST TUBE INSERTION MEDICATIONS: The patient is currently admitted to the hospital and receiving intravenous antibiotics. The antibiotics were administered within an appropriate time frame prior to the initiation of the procedure. ANESTHESIA/SEDATION: Fentanyl 100 mcg IV; Versed 2.0 mg IV Moderate Sedation Time:  10 minutes The patient was continuously monitored during the procedure by the interventional radiology nurse under my direct supervision. COMPLICATIONS: None immediate. PROCEDURE: Informed written consent was obtained from the patient after a thorough discussion of the procedural risks, benefits and alternatives. All questions were addressed. Maximal Sterile Barrier Technique was utilized including caps, mask, sterile gowns, sterile gloves, sterile drape, hand hygiene and skin antiseptic. A timeout  was performed prior to the initiation of the procedure. Previous imaging reviewed. Patient positioned slightly left anterior oblique. Noncontrast localization CT performed. The loculated complex left pleural effusion was localized. A mid intercostal space was marked in the mid axillary line. Under sterile conditions and local anesthesia, an 18 gauge 10 cm access needle was advanced under CT guidance into the left chest fluid collection. Needle position confirmed with CT. Syringe aspiration yielded purulent fluid. Amplatz guidewire inserted followed by tract dilatation to insert 14 [place] drain. Drain catheter position confirmed with CT. Catheter secured with a Prolene suture and connected to Pleur-Evac externally. Sterile dressing applied. Patient tolerated the procedure well. No immediate complication. IMPRESSION: Successful CT-guided left chest 14 French drain for empyema. Culture sent. Electronically Signed   By: Judie Petit.  Shick M.D.   On: 12/20/2016 16:30     Assessment/Plan: S/P  Left chest drain in place , fu chest xray in am Likely will need follow up ct and further drainage ,    Delight Ovens MD 12/21/2016 12:22 PM

## 2016-12-21 NOTE — Progress Notes (Signed)
  PHARMACY - PHYSICIAN COMMUNICATION CRITICAL VALUE ALERT - BLOOD CULTURE IDENTIFICATION (BCID)  Results for orders placed or performed during the hospital encounter of 12/19/16  Blood Culture ID Panel (Reflexed) (Collected: 12/20/2016 12:20 AM)  Result Value Ref Range   Enterococcus species NOT DETECTED NOT DETECTED   Listeria monocytogenes NOT DETECTED NOT DETECTED   Staphylococcus species DETECTED (A) NOT DETECTED   Staphylococcus aureus DETECTED (A) NOT DETECTED   Methicillin resistance DETECTED (A) NOT DETECTED   Streptococcus species NOT DETECTED NOT DETECTED   Streptococcus agalactiae NOT DETECTED NOT DETECTED   Streptococcus pneumoniae NOT DETECTED NOT DETECTED   Streptococcus pyogenes NOT DETECTED NOT DETECTED   Acinetobacter baumannii NOT DETECTED NOT DETECTED   Enterobacteriaceae species NOT DETECTED NOT DETECTED   Enterobacter cloacae complex NOT DETECTED NOT DETECTED   Escherichia coli NOT DETECTED NOT DETECTED   Klebsiella oxytoca NOT DETECTED NOT DETECTED   Klebsiella pneumoniae NOT DETECTED NOT DETECTED   Proteus species NOT DETECTED NOT DETECTED   Serratia marcescens NOT DETECTED NOT DETECTED   Haemophilus influenzae NOT DETECTED NOT DETECTED   Neisseria meningitidis NOT DETECTED NOT DETECTED   Pseudomonas aeruginosa NOT DETECTED NOT DETECTED   Candida albicans NOT DETECTED NOT DETECTED   Candida glabrata NOT DETECTED NOT DETECTED   Candida krusei NOT DETECTED NOT DETECTED   Candida parapsilosis NOT DETECTED NOT DETECTED   Candida tropicalis NOT DETECTED NOT DETECTED    Name of physician (or Provider) Contacted: K. Schorr  1/4 blood cx's positive. BCID detects MRSA. Has osteo of spine and abscess in epidural space. Also with acute pyelo. On ceftriaxone and vancomycin.   Changes to prescribed antibiotics required: No change needed for now  Keith Riggs, PharmD, [facility]trBCPS Clinical Pharmacist Pager [phone] 12/21/2016 10:16 PM

## 2016-12-21 NOTE — Progress Notes (Signed)
Referring Physician(s): Sheliah Plane  Supervising Physician: Ruel Favors  Patient Status:  [facility] - In-pt  Chief Complaint: Left empyema S/P left pigtail chest catheter placement by Dr. Miles Costain 12/21/2016  Subjective:  Patient doing ok and had no complaints.   Allergies: Bee venom; Voltaren [diclofenac sodium]; and Latex  Medications: Prior to Admission medications   Medication Sig Start Date End Date Taking? Authorizing Provider  cephALEXin (KEFLEX) 500 MG capsule Take 500 mg by mouth 4 (four) times daily. 12/15/16  Yes [provider]  HYDROcodone-acetaminophen (NORCO/VICODIN) 5-325 MG tablet Take 1 tablet by mouth every 4 (four) hours as needed. for pain 12/17/16  Yes [provider]  Insulin Glargine (TOUJEO MAX SOLOSTAR) 300 UNIT/ML SOPN Inject 40 Units into the skin daily.   Yes [provider]  metFORMIN (GLUCOPHAGE) 500 MG tablet Take 500 mg by mouth 2 (two) times daily with a meal.   Yes [provider]     Vital Signs: BP (!) 148/74 (BP Location: Left Arm)   Pulse (!) 116   Temp 99.3 F (37.4 C) (Oral)   Resp (!) 33   Ht 5\' 11"  (1.803 m)   Wt 237 lb (107.5 kg)   SpO2 91%   BMI 33.05 kg/m   Physical Exam Awake and alert NAD Left chest catheter in place Dressing clean and dry Purulent drainage in pleurevac 490 mL output recorded  Imaging: Ct Chest W Contrast  Result Date: 12/20/2016 CLINICAL DATA:  Worsening left back pain over the past 2 weeks with nausea and vomiting. Thoracic spine MR earlier today demonstrating multifocal epidural abscess with paraspinal myositis and paraspinal muscle abscesses and bilateral pleural empyema with probable pneumonia and possible lung abscesses. Chest CT was recommended. 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 EXAM: CT CHEST, ABDOMEN, AND PELVIS WITH CONTRAST TECHNIQUE: Multidetector CT imaging of the chest, abdomen and pelvis was performed following the standard  protocol during bolus administration of intravenous contrast. CONTRAST:  ISOVUE-300 IOPAMIDOL (ISOVUE-300) INJECTION 61% COMPARISON:  Thoracic spine MR obtained earlier today. FINDINGS: CT CHEST FINDINGS Cardiovascular: No significant vascular findings. Normal heart size. No pericardial effusion. Mediastinum/Nodes: Prominent AP window lymph node with a short axis diameter of 11 mm on image number 27 of series 3. Mildly prominent precarinal node with a short axis diameter of 8 mm on image number 46 of series 3. Lungs/Pleura: Large number of bilateral lung nodules, most numerous on the right. Some of these demonstrate central cavitation. The largest on the left is at the medial left lung apex, measuring 1.7 cm in maximum diameter on image number 39 of series 4. The largest on the right is in the posterior aspect of the right upper lobe, with a maximum diameter of 1.6 cm on image number 53 of series 4. Small right pleural effusion and mild associated right lower lobe atelectasis. Moderate-sized multiloculated left pleural effusion and moderate compressive and dependent atelectasis. Musculoskeletal: Previously described left paraspinal muscle abscess. This is largest on image number 32 of series 3, measuring 3.9 x 2.3 cm on that image. There is a more discrete abscess with rim enhancement on the left on image number 36 of series 3, measuring 2.0 x 1.4 cm. The previously demonstrated multifocal epidural abscess is better visualized on the MR images. No bone destruction seen. Mild thoracic and lower cervical spine degenerative changes. CT ABDOMEN PELVIS FINDINGS Hepatobiliary: No focal liver abnormality is seen. No gallstones, gallbladder wall thickening, or biliary dilatation. Pancreas: Unremarkable. No pancreatic  ductal dilatation or surrounding inflammatory changes. Spleen: Normal in size without focal abnormality. Adrenals/Urinary Tract: Both kidneys are mildly enlarged with poorly defined areas low density.  There is a very small medial right perinephric fluid collection measuring 1.9 x 1.1 cm on image number 76 series 3. Minimal bilateral perinephric soft tissue stranding. Unremarkable ureters, urinary bladder and adrenal glands. No hydronephrosis. Stomach/Bowel: Stomach is within normal limits. Appendix appears normal. No evidence of bowel wall thickening, distention, or inflammatory changes. Vascular/Lymphatic: No significant vascular findings are present. No enlarged abdominal or pelvic lymph nodes. Reproductive: Prostate is unremarkable. Other: Very small bilateral inguinal hernias containing fat. Musculoskeletal: The left paraspinal muscle abscesses extend to the level of the upper abdomen. The largest portion of the abscess at that level measures 4.3 x 2.7 cm on image number 58 of series 3. No bone destruction is seen. An L1 vertebral body hemangioma is incidentally noted. There is soft tissue gas in the inferior, medial left buttock region subcutaneous fat on the most inferior images with no fluid collection seen on those images. IMPRESSION: 1. Multiple cavitary nodules in both lungs, compatible with septic emboli. 2. Moderate-sized multiloculated left pleural effusion. The multiple loculations are compatible with empyema formation. 3. Previously described epidural and left paraspinal abscesses. 4. Bilateral lung atelectasis, greater on the left. 5. Soft tissue gas in the inferior, medial left buttock region, compatible with the patient's known groin abscesses. 6. Evidence of bilateral pyelonephritis with a small right perinephric abscess or small flattened exophytic cyst. 7. Minimal mediastinal adenopathy, most likely reactive. Electronically Signed   By: Beckie Salts M.D.   On: 12/20/2016 09:11   Mr Thoracic Spine W Wo Contrast  Result Date: 12/20/2016 CLINICAL DATA:  Severe mid back pain for 2 weeks. History of diabetes , intravenous drug use and recent groin abscess. EXAM: MRI THORACIC WITHOUT AND WITH  CONTRAST TECHNIQUE: Multiplanar and multiecho pulse sequences of the thoracic spine were obtained without and with intravenous contrast. CONTRAST:  20mL MULTIHANCE GADOBENATE DIMEGLUMINE 529 MG/ML IV SOLN COMPARISON:  Thoracic spine radiograph December 15, 2016 FINDINGS: Generally poor signal to noise ratio due to habitus and motion. The axial sequences of the lower thoracic spine are nondiagnostic. ALIGNMENT: Straightened thoracic kyphosis. No malalignment. VERTEBRAE/DISCS: Vertebral bodies are intact. Intervertebral discs morphology and signal are normal. Faintly increased STIR signal and enhancement T6 vertebral body. No abnormal disc enhancement. CORD: 1.7 x 1.3 x 7.8 cm (transverse by AP by CC) ventral epidural abscess from T4-5 through T8-9 resulting in severe cord compression T5-6 through T7 nondiagnostic examination for spinal cord edema. No syrinx. LEFT ventral epidural abscess from T2 to T5-6, 7.1 cm in cranial caudad dimension resulting in mild cord compression. PREVERTEBRAL AND PARASPINAL SOFT TISSUES: Partially imaged lobulated, potentially loculated LEFT pleural effusion. Patchy LEFT lung consolidation. T2 bright signal within the bilateral paraspinal muscles compatible with mild bursitis. Complex 4.5 x 2.5 x 13.8 cm LEFT paraspinal muscle abscess from T5 through T12. 0.8 x 0.8 x 8.3 cm RIGHT paraspinal muscle abscess at the level of the epidural collection. Prevertebral subpleural abscesses bilaterally measuring to at least 1.4 x 2.4 cm on the LEFT. IMPRESSION: 1. Limited examination, nondiagnostic evaluation through the lower thoracic spine. 2. 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 No syrinx. 3. 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 4. Paraspinal myositis with paraspinal muscle abscesses, LEFT greater than RIGHT. 5. Bilateral pleural empyema and, probable pneumonia.  Lung abscess is possible. Recommend  CT chest with contrast. Critical Value/emergent results were called by telephone at the time of interpretation on 12/20/2016 at 5:21 am to Dr. Azalia Bilis, ED, who verbally acknowledged these results. Electronically Signed   By: Awilda Metro M.D.   On: 12/20/2016 05:25   Ct Abdomen Pelvis W Contrast  Result Date: 12/20/2016 CLINICAL DATA:  Worsening left back pain over the past 2 weeks with nausea and vomiting. Thoracic spine MR earlier today demonstrating multifocal epidural abscess with paraspinal myositis and paraspinal muscle abscesses and bilateral pleural empyema with probable pneumonia and possible lung abscesses. Chest CT was recommended. 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 EXAM: CT CHEST, ABDOMEN, AND PELVIS WITH CONTRAST TECHNIQUE: Multidetector CT imaging of the chest, abdomen and pelvis was performed following the standard protocol during bolus administration of intravenous contrast. CONTRAST:  ISOVUE-300 IOPAMIDOL (ISOVUE-300) INJECTION 61% COMPARISON:  Thoracic spine MR obtained earlier today. FINDINGS: CT CHEST FINDINGS Cardiovascular: No significant vascular findings. Normal heart size. No pericardial effusion. Mediastinum/Nodes: Prominent AP window lymph node with a short axis diameter of 11 mm on image number 27 of series 3. Mildly prominent precarinal node with a short axis diameter of 8 mm on image number 46 of series 3. Lungs/Pleura: Large number of bilateral lung nodules, most numerous on the right. Some of these demonstrate central cavitation. The largest on the left is at the medial left lung apex, measuring 1.7 cm in maximum diameter on image number 39 of series 4. The largest on the right is in the posterior aspect of the right upper lobe, with a maximum diameter of 1.6 cm on image number 53 of series 4. Small right pleural effusion and mild associated right lower lobe atelectasis. Moderate-sized multiloculated left pleural effusion and moderate  compressive and dependent atelectasis. Musculoskeletal: Previously described left paraspinal muscle abscess. This is largest on image number 32 of series 3, measuring 3.9 x 2.3 cm on that image. There is a more discrete abscess with rim enhancement on the left on image number 36 of series 3, measuring 2.0 x 1.4 cm. The previously demonstrated multifocal epidural abscess is better visualized on the MR images. No bone destruction seen. Mild thoracic and lower cervical spine degenerative changes. CT ABDOMEN PELVIS FINDINGS Hepatobiliary: No focal liver abnormality is seen. No gallstones, gallbladder wall thickening, or biliary dilatation. Pancreas: Unremarkable. No pancreatic ductal dilatation or surrounding inflammatory changes. Spleen: Normal in size without focal abnormality. Adrenals/Urinary Tract: Both kidneys are mildly enlarged with poorly defined areas low density. There is a very small medial right perinephric fluid collection measuring 1.9 x 1.1 cm on image number 76 series 3. Minimal bilateral perinephric soft tissue stranding. Unremarkable ureters, urinary bladder and adrenal glands. No hydronephrosis. Stomach/Bowel: Stomach is within normal limits. Appendix appears normal. No evidence of bowel wall thickening, distention, or inflammatory changes. Vascular/Lymphatic: No significant vascular findings are present. No enlarged abdominal or pelvic lymph nodes. Reproductive: Prostate is unremarkable. Other: Very small bilateral inguinal hernias containing fat. Musculoskeletal: The left paraspinal muscle abscesses extend to the level of the upper abdomen. The largest portion of the abscess at that level measures 4.3 x 2.7 cm on image number 58 of series 3. No bone destruction is seen. An L1 vertebral body hemangioma is incidentally noted. There is soft tissue gas in the inferior, medial left buttock region subcutaneous fat on the most inferior images with no fluid collection seen on those images. IMPRESSION: 1.  Multiple cavitary nodules in  both lungs, compatible with septic emboli. 2. Moderate-sized multiloculated left pleural effusion. The multiple loculations are compatible with empyema formation. 3. Previously described epidural and left paraspinal abscesses. 4. Bilateral lung atelectasis, greater on the left. 5. Soft tissue gas in the inferior, medial left buttock region, compatible with the patient's known groin abscesses. 6. Evidence of bilateral pyelonephritis with a small right perinephric abscess or small flattened exophytic cyst. 7. Minimal mediastinal adenopathy, most likely reactive. Electronically Signed   By: Beckie SaltsSteven  Reid M.D.   On: 12/20/2016 09:11   Dg Chest Port 1 View  Result Date: 12/21/2016 CLINICAL DATA:  Empyema. EXAM: PORTABLE CHEST 1 VIEW COMPARISON:  CT 12/20/2016 FINDINGS: Loculated left pleural effusion again noted, similar to prior CT. Left pleural pigtail drainage catheter noted. No pneumothorax. Diffuse bilateral airspace disease. Cardiomegaly. IMPRESSION: Large loculated left pleural effusion with left pleural drainage catheter in place. No pneumothorax. Bilateral airspace disease. Electronically Signed   By: Charlett NoseKevin  Dover M.D.   On: 12/21/2016 10:04   Ct Image Guided Fluid Drain By Catheter  Result Date: 12/20/2016 INDICATION: Left empyema EXAM: CT GUIDED 14 FRENCH LEFT CHEST TUBE INSERTION MEDICATIONS: The patient is currently admitted to the hospital and receiving intravenous antibiotics. The antibiotics were administered within an appropriate time frame prior to the initiation of the procedure. ANESTHESIA/SEDATION: Fentanyl 100 mcg IV; Versed 2.0 mg IV Moderate Sedation Time:  10 minutes The patient was continuously monitored during the procedure by the interventional radiology nurse under my direct supervision. COMPLICATIONS: None immediate. PROCEDURE: Informed written consent was obtained from the patient after a thorough discussion of the procedural risks, benefits and  alternatives. All questions were addressed. Maximal Sterile Barrier Technique was utilized including caps, mask, sterile gowns, sterile gloves, sterile drape, hand hygiene and skin antiseptic. A timeout was performed prior to the initiation of the procedure. Previous imaging reviewed. Patient positioned slightly left anterior oblique. Noncontrast localization CT performed. The loculated complex left pleural effusion was localized. A mid intercostal space was marked in the mid axillary line. Under sterile conditions and local anesthesia, an 18 gauge 10 cm access needle was advanced under CT guidance into the left chest fluid collection. Needle position confirmed with CT. Syringe aspiration yielded purulent fluid. Amplatz guidewire inserted followed by tract dilatation to insert 14 [place] drain. Drain catheter position confirmed with CT. Catheter secured with a Prolene suture and connected to Pleur-Evac externally. Sterile dressing applied. Patient tolerated the procedure well. No immediate complication. IMPRESSION: Successful CT-guided left chest 14 French drain for empyema. Culture sent. Electronically Signed   By: Judie PetitM.  Shick M.D.   On: 12/20/2016 16:30    Labs:  CBC:  Recent Labs  12/20/16 0011 12/21/16 0242  WBC 26.5* 26.0*  HGB 10.3* 10.7*  HCT 31.6* 32.9*  PLT 529* 544*    COAGS: No results for input(s): INR, APTT in the last 8760 hours.  BMP:  Recent Labs  12/20/16 0043 12/20/16 0909 12/20/16 1939 12/21/16 0242  NA 126* 127* 127* 127*  K 3.5 3.6 3.2* 3.0*  CL 95* 95* 93* 93*  CO2 21* 20* 25 27  GLUCOSE 191* 195* 250* 220*  BUN 8 5* <5* <5*  CALCIUM 7.8* 7.7* 7.8* 7.9*  CREATININE 0.63 0.64 0.55* 0.49*  GFRNONAA >60 >60 >60 >60  GFRAA >60 >60 >60 >60    LIVER FUNCTION TESTS:  Recent Labs  12/20/16 0909  PROT 5.9*    Assessment and Plan:  Left empyema  Chest X-ray this am shows empyema still large.  S/P Left chest pigtail catheter by Dr. Miles Costain  yesterday.  Continue to suction for now.  Electronically Signed: Gwynneth Macleod, PA-C 12/21/2016, 12:05 PM   I spent a total of 15 Minutes at the the patient's bedside AND on the patient's hospital floor or unit, greater than 50% of which was counseling/coordinating care for left chest tube.

## 2016-12-22 ENCOUNTER — Inpatient Hospital Stay (HOSPITAL_COMMUNITY): Payer: Self-pay

## 2016-12-22 ENCOUNTER — Encounter (HOSPITAL_COMMUNITY): Payer: Self-pay

## 2016-12-22 DIAGNOSIS — M4640 Discitis, unspecified, site unspecified: Secondary | ICD-10-CM

## 2016-12-22 DIAGNOSIS — G062 Extradural and subdural abscess, unspecified: Secondary | ICD-10-CM

## 2016-12-22 DIAGNOSIS — R918 Other nonspecific abnormal finding of lung field: Secondary | ICD-10-CM

## 2016-12-22 DIAGNOSIS — A4102 Sepsis due to Methicillin resistant Staphylococcus aureus: Principal | ICD-10-CM

## 2016-12-22 DIAGNOSIS — J869 Pyothorax without fistula: Secondary | ICD-10-CM

## 2016-12-22 HISTORY — DX: Sepsis due to methicillin resistant Staphylococcus aureus: A41.02

## 2016-12-22 LAB — CBC
HEMATOCRIT: 33.6 % — AB (ref 39.0–52.0)
HEMOGLOBIN: 10.9 g/dL — AB (ref 13.0–17.0)
MCH: 26.9 pg (ref 26.0–34.0)
MCHC: 32.4 g/dL (ref 30.0–36.0)
MCV: 83 fL (ref 78.0–100.0)
Platelets: 610 10*3/uL — ABNORMAL HIGH (ref 150–400)
RBC: 4.05 MIL/uL — ABNORMAL LOW (ref 4.22–5.81)
RDW: 13.8 % (ref 11.5–15.5)
WBC: 25.6 10*3/uL — ABNORMAL HIGH (ref 4.0–10.5)

## 2016-12-22 LAB — GLUCOSE, CAPILLARY
GLUCOSE-CAPILLARY: 171 mg/dL — AB (ref 65–99)
GLUCOSE-CAPILLARY: 246 mg/dL — AB (ref 65–99)
Glucose-Capillary: 167 mg/dL — ABNORMAL HIGH (ref 65–99)
Glucose-Capillary: 284 mg/dL — ABNORMAL HIGH (ref 65–99)
Glucose-Capillary: 297 mg/dL — ABNORMAL HIGH (ref 65–99)

## 2016-12-22 LAB — VANCOMYCIN, TROUGH: Vancomycin Tr: 5 ug/mL — ABNORMAL LOW (ref 15–20)

## 2016-12-22 LAB — BASIC METABOLIC PANEL
Anion gap: 10 (ref 5–15)
CHLORIDE: 94 mmol/L — AB (ref 101–111)
CO2: 28 mmol/L (ref 22–32)
Calcium: 8.2 mg/dL — ABNORMAL LOW (ref 8.9–10.3)
Creatinine, Ser: 0.5 mg/dL — ABNORMAL LOW (ref 0.61–1.24)
GFR calc Af Amer: >60 mL/min (ref >60)
GFR calc non Af Amer: >60 mL/min (ref >60)
GLUCOSE: 193 mg/dL — AB (ref 65–99)
Potassium: 3.5 mmol/L (ref 3.5–5.1)
Sodium: 132 mmol/L — ABNORMAL LOW (ref 135–145)

## 2016-12-22 LAB — RAPID URINE DRUG SCREEN, HOSP PERFORMED
Amphetamines: NOT DETECTED
BARBITURATES: NOT DETECTED
BENZODIAZEPINES: NOT DETECTED
COCAINE: NOT DETECTED
OPIATES: POSITIVE — AB
TETRAHYDROCANNABINOL: NOT DETECTED

## 2016-12-22 LAB — CK: Total CK: 20 U/L — ABNORMAL LOW (ref 49–397)

## 2016-12-22 MED ORDER — CHLORHEXIDINE GLUCONATE CLOTH 2 % EX PADS
6.0000 | MEDICATED_PAD | Freq: Every day | CUTANEOUS | Status: AC
  Administered 2016-12-22 – 2016-12-27 (×5): 6 via TOPICAL

## 2016-12-22 MED ORDER — MUPIROCIN 2 % EX OINT
1.0000 "application " | TOPICAL_OINTMENT | Freq: Two times a day (BID) | CUTANEOUS | Status: AC
  Administered 2016-12-22 – 2016-12-27 (×10): 1 via NASAL
  Filled 2016-12-22: qty 22

## 2016-12-22 MED ORDER — SODIUM CHLORIDE 0.9 % IV SOLN
8.0000 mg/kg | INTRAVENOUS | Status: AC
  Administered 2016-12-22: 860 mg via INTRAVENOUS
  Filled 2016-12-22: qty 17.2

## 2016-12-22 MED ORDER — ENOXAPARIN SODIUM 40 MG/0.4ML ~~LOC~~ SOLN
40.0000 mg | SUBCUTANEOUS | Status: DC
  Administered 2016-12-22: 40 mg via SUBCUTANEOUS
  Filled 2016-12-22: qty 0.4

## 2016-12-22 MED ORDER — SODIUM CHLORIDE 0.9 % IV SOLN
8.0000 mg/kg | INTRAVENOUS | Status: DC
  Administered 2016-12-23 – 2017-01-05 (×14): 860 mg via INTRAVENOUS
  Filled 2016-12-22 (×21): qty 17.2

## 2016-12-22 MED ORDER — INSULIN GLARGINE 100 UNIT/ML ~~LOC~~ SOLN
36.0000 [IU] | Freq: Every day | SUBCUTANEOUS | Status: DC
  Administered 2016-12-23: 36 [IU] via SUBCUTANEOUS
  Filled 2016-12-22: qty 0.36

## 2016-12-22 NOTE — Progress Notes (Signed)
Pharmacy Antibiotic Note  Keith Riggs is a 26 y.o. male admitted on 12/19/2016 with .  Pt had been on Keflex after having abscesses drained this week.  Chest tubes placed 7/14 with cultures growing MRSA, consistent with blood culture results.   ID following the patient and he is currently on vancomycin and ceftriaxone. Vancomycin trough resulted <4 on 1000mg  IV q8 hour regimen. Given patient's age, renal function, and infection severity, the dosing interval was changed to every 6 hours. ID requested a one time dose of daptomycin be entered. Will also check trough tonight prior to fourth dose.   Plan: Daptomycin 8mg /kg x1 Vancomycin 1000mg  IV q6 hours Goal trough 15-20 mcg/mL. Ceftriaxone 2g IV q24 hours F/U clinical progression and culture/sensitivities. VT tonight prior to 1800 dose  Height: 5\' 11"  (180.3 cm) Weight: 237 lb (107.5 kg) IBW/kg (Calculated) : 75.3  Temp (24hrs), Avg:99 F (37.2 C), Min:97.7 F (36.5 C), Max:100.3 F (37.9 C)   Allergies  Allergen Reactions  . Bee Venom Anaphylaxis  . Voltaren [Diclofenac Sodium] Other (See Comments)    "Makes me feel not myself"  . Latex Rash   Zosyn 7/14 >> x1 Vancomycin 7/14 CFTX 7/14  7/15 MRSA PCR: Positive 7/14 Wound Cx: MRSA 7/14 BCX: MRSA  Thank you for allowing pharmacy to be a part of this patient's care.  Alfredo BachJoseph Arminger, PharmD Clinical Pharmacist [phone] (Pager) 12/22/2016 11:53 AM

## 2016-12-22 NOTE — Progress Notes (Addendum)
PROGRESS NOTE    Keith Riggs   UJW:[id number]  DOB: 1990/06/25  DOA: 12/19/2016 PCP: System, Pcp Not In   Brief Narrative:  26 y/o with IDDM, past history of heroin abuse, cocaine abuse who get random drug tests 2 x a month. He is a transfer from [facility] ER. History obtained from Grandmother and father whom he lives with.  He was seen there for a right groin abscess on Wed which was I and D'd. He was seen again on Friday for a packing of the abscess and was found to have a left groin abscess with was I and D'd. He returned to there ER in the after noon because he thought his packing had fallen out. At that time, he complained of back pain and was sent to [facility] for and MRI. This showed thoracic epidural abscesses causing severe spinal cord compression, paraspinal myositis and abscesses, T6 osteo and b/l pleural effusions.   CT chest abd pelvis: 1. Multiple cavitary nodules in both lungs, compatible with septic emboli. 2. Moderate-sized multiloculated left pleural effusion. The multiple loculations are compatible with empyema formation. 3. Previously described epidural and left paraspinal abscesses. 4. Bilateral lung atelectasis, greater on the left. 5. Soft tissue gas in the inferior, medial left buttock region, compatible with the patient's known groin abscesses. 6. Evidence of bilateral pyelonephritis with a small right perinephric abscess or small flattened exophytic cyst. 7. Minimal mediastinal adenopathy, most likely reactive.    Subjective: Back pain is currently controlled. Has been able to walk around the room. Has mild cough. No other complaints.   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- sodium not improving with Saline infusion - held NS 7/15- sodium up to 132 today  Hypokalemia/ hypomagnesemia - cont to replace and follow    DM2 (diabetes mellitus, type 2) - cont Lantus and SSI- dose of Lantus increased 7/15- will increase again today    IVDU (intravenous drug user) - has used IV Cocaine and Heroin in the past - states he has not used in 2-3 months - grandmother states he get random drug tests 2 x month - check UDS   Cigarette smoker - Nicotine patch    Normocytic anemia - anemia >> Low Iron levels and normal Iron binding, retic count normal- Ferritin high likely due to infection - consistent with AOCD  Morbid Obesity  Body mass index is 33.05 kg/m.   DVT prophylaxis: Lovenox Code Status: Full code Family Communication: father and grandmother Disposition Plan: SDU Consultants:   ID  CT surgery  Neurosurgery  IR Procedures:   CT guided left chest tube Antimicrobials:  Anti-infectives    Start     Dose/Rate Route Frequency Ordered Stop   12/21/16 2200  vancomycin (VANCOCIN) IVPB 1000 mg/200 mL premix     1,000 mg 200 mL/hr over 60 Minutes  Intravenous Every 6 hours 12/21/16 2110     12/21/16 2000  vancomycin (VANCOCIN) IVPB 1000 mg/200 mL premix  Status:  Discontinued     1,000 mg 200 mL/hr over 60 Minutes Intravenous Every 8 hours 12/21/16 1339 12/21/16 2109   12/20/16 1300  piperacillin-tazobactam (ZOSYN) IVPB 3.375 g  Status:  Discontinued     3.375 g 12.5 mL/hr over 240 Minutes Intravenous Every 8 hours 12/20/16 1109 12/20/16 1214   12/20/16 1300  cefTRIAXone (ROCEPHIN) 2 g in dextrose 5 % 50 mL IVPB     2 g 100 mL/hr over 30 Minutes Intravenous Every 24 hours 12/20/16 1214     12/20/16 0200  vancomycin (VANCOCIN) IVPB 1000 mg/200 mL premix  Status:  Discontinued     1,000 mg 200 mL/hr over 60 Minutes Intravenous Every 8 hours 12/20/16 0024 12/21/16 1339   12/20/16 0200  piperacillin-tazobactam (ZOSYN) IVPB 3.375 g  Status:  Discontinued     3.375 g 12.5 mL/hr over 240 Minutes Intravenous Every 8 hours 12/20/16 0024 12/20/16 1109       Objective: Vitals:   12/21/16 2300 12/21/16 2352 12/22/16 0250 12/22/16 0352  BP:  114/64  136/69  Pulse:      Resp: (!) 24 (!) 22  (!) 25  Temp:  100.3 F (37.9 C) 97.7 F (36.5 C) 98.9 F (37.2 C)  TempSrc:  Oral Axillary Oral  SpO2: 93% 94%  96%  Weight:      Height:        Intake/Output Summary (Last 24 hours) at 12/22/16 1141 Last data filed at 12/22/16 0600  Gross per 24 hour  Intake              840 ml  Output             5690 ml  Net            -4850 ml   Filed Weights   12/19/16 2324  Weight: 107.5 kg (237 lb)    Examination: General exam: Appears uncomfortable  HEENT: PERRLA, oral mucosa moist, no sclera icterus or thrush Respiratory system: Clear to auscultation. RR in mid 20s on my exam. - CT drain with yellow fluid-  crackles in left lower lung field Cardiovascular system: S1 & S2 heard, RRR.  No murmurs  Gastrointestinal system: Abdomen soft, non-tender, nondistended. Normal bowel sound. No organomegaly Central nervous system: Alert and oriented. No  focal neurological deficits. Extremities: No cyanosis, clubbing or edema Skin: No rashes or ulcers Psychiatry:  Mood & affect appropriate.     Data Reviewed: I have personally reviewed following labs and imaging studies  CBC:  Recent Labs Lab 12/20/16 0011 12/21/16 0242 12/22/16 0211  WBC 26.5* 26.0* 25.6*  HGB 10.3* 10.7* 10.9*  HCT 31.6* 32.9* 33.6*  MCV 82.3 83.9 83.0  PLT 529* 544* 610*   Basic Metabolic Panel:  Recent Labs Lab 12/20/16 0909 12/20/16 1939 12/21/16 0242 12/21/16 0840 12/21/16 1847 12/22/16 0211  NA 127* 127* 127*  --  124* 132*  K 3.6 3.2* 3.0*  --  3.6 3.5  CL 95* 93* 93*  --  90* 94*  CO2 20* 25 27  --  26 28  GLUCOSE 195* 250* 220*  --  260* 193*  BUN 5* <5* <5*  --  <5* <5*  CREATININE 0.64 0.55* 0.49*  --  0.60* 0.50*  CALCIUM 7.7* 7.8* 7.9*  --  7.9* 8.2*  MG  --   --   --  1.6*  --   --    GFR: Estimated Creatinine Clearance: 171.5 mL/min (A) (by C-G formula based on SCr of 0.5 mg/dL (L)). Liver Function Tests:  Recent Labs Lab 12/20/16 0909  PROT 5.9*   No results for input(s): LIPASE, AMYLASE in the last 168 hours. No results for input(s): AMMONIA in the last 168 hours. Coagulation Profile: No results for input(s): INR, PROTIME in the last 168 hours. Cardiac Enzymes: No results for input(s): CKTOTAL, CKMB, CKMBINDEX, TROPONINI in the last 168 hours. BNP (last 3 results) No results for input(s): PROBNP in the last 8760 hours. HbA1C: No results for input(s): HGBA1C in the last 72 hours. CBG:  Recent Labs Lab 12/21/16 1611 12/21/16 2041 12/21/16 2343 12/22/16 0351 12/22/16 0816  GLUCAP 209* 231* 233* 167* 284*   Lipid Profile: No results for input(s): CHOL, HDL, LDLCALC, TRIG, CHOLHDL, LDLDIRECT in the last 72 hours. Thyroid Function Tests: No results for input(s): TSH, T4TOTAL, FREET4, T3FREE, THYROIDAB in the last 72 hours. Anemia Panel:  Recent Labs  12/20/16 1939 12/21/16 0242  VITAMINB12  --  397  FOLATE   --  9.8  FERRITIN 1,249*  --   TIBC 132*  --   IRON 17*  --   RETICCTPCT 0.7  --    Urine analysis: No results found for: COLORURINE, APPEARANCEUR, LABSPEC, PHURINE, GLUCOSEU, HGBUR, BILIRUBINUR, KETONESUR, PROTEINUR, UROBILINOGEN, NITRITE, LEUKOCYTESUR Sepsis Labs: @LABRCNTIP (procalcitonin:4,lacticidven:4) ) Recent Results (from the past 240 hour(s))  Blood culture (routine x 2)     Status: None (Preliminary result)   Collection Time: 12/20/16 12:01 AM  Result Value Ref Range Status   Specimen Description BLOOD RIGHT HAND  Final   Special Requests   Final    BOTTLES DRAWN AEROBIC AND ANAEROBIC Blood Culture adequate volume   Culture  Setup Time   Final    GRAM POSITIVE COCCI IN CLUSTERS ANAEROBIC BOTTLE ONLY CRITICAL RESULT CALLED TO, READ BACK BY AND VERIFIED WITH: K. COOK PHARMD, AT 0701 12/22/16 BY D.VANHOOK    Culture GRAM POSITIVE COCCI  Final   Report Status PENDING  Incomplete  Blood culture (routine x 2)     Status: Abnormal (Preliminary result)   Collection Time: 12/20/16 12:20 AM  Result Value Ref Range Status   Specimen Description BLOOD LEFT ANTECUBITAL  Final   Special Requests   Final    BOTTLES DRAWN AEROBIC AND ANAEROBIC Blood Culture adequate volume   Culture  Setup Time   Final    GRAM POSITIVE COCCI IN CLUSTERS ANAEROBIC BOTTLE ONLY CRITICAL RESULT CALLED TO, READ BACK BY AND VERIFIED WITHPatrick North: N. BATCHELDER, PHARMD 2111 12/21/2016 T. TYSOR    Culture (A)  Final    STAPHYLOCOCCUS AUREUS SUSCEPTIBILITIES TO FOLLOW    Report Status PENDING  Incomplete  Blood Culture ID Panel (Reflexed)     Status: Abnormal   Collection Time: 12/20/16 12:20 AM  Result Value Ref Range Status   Enterococcus species NOT DETECTED NOT DETECTED Final   Listeria monocytogenes NOT DETECTED NOT DETECTED Final   Staphylococcus  species DETECTED (A) NOT DETECTED Final    Comment: CRITICAL RESULT CALLED TO, READ BACK BY AND VERIFIED WITH: Patrick North, PHARMD 2111 12/21/2016 T.  TYSOR    Staphylococcus aureus DETECTED (A) NOT DETECTED Final    Comment: Methicillin (oxacillin)-resistant Staphylococcus aureus (MRSA). MRSA is predictably resistant to beta-lactam antibiotics (except ceftaroline). Preferred therapy is vancomycin unless clinically contraindicated. Patient requires contact precautions if  hospitalized. CRITICAL RESULT CALLED TO, READ BACK BY AND VERIFIED WITH: Patrick North, PHARMD 2111 12/21/2016 T. TYSOR    Methicillin resistance DETECTED (A) NOT DETECTED Final    Comment: CRITICAL RESULT CALLED TO, READ BACK BY AND VERIFIED WITH: Patrick North, PHARMD 2111 12/21/2016 T. TYSOR    Streptococcus species NOT DETECTED NOT DETECTED Final   Streptococcus agalactiae NOT DETECTED NOT DETECTED Final   Streptococcus pneumoniae NOT DETECTED NOT DETECTED Final   Streptococcus pyogenes NOT DETECTED NOT DETECTED Final   Acinetobacter baumannii NOT DETECTED NOT DETECTED Final   Enterobacteriaceae species NOT DETECTED NOT DETECTED Final   Enterobacter cloacae complex NOT DETECTED NOT DETECTED Final   Escherichia coli NOT DETECTED NOT DETECTED Final   Klebsiella oxytoca NOT DETECTED NOT DETECTED Final   Klebsiella pneumoniae NOT DETECTED NOT DETECTED Final   Proteus species NOT DETECTED NOT DETECTED Final   Serratia marcescens NOT DETECTED NOT DETECTED Final   Haemophilus influenzae NOT DETECTED NOT DETECTED Final   Neisseria meningitidis NOT DETECTED NOT DETECTED Final   Pseudomonas aeruginosa NOT DETECTED NOT DETECTED Final   Candida albicans NOT DETECTED NOT DETECTED Final   Candida glabrata NOT DETECTED NOT DETECTED Final   Candida krusei NOT DETECTED NOT DETECTED Final   Candida parapsilosis NOT DETECTED NOT DETECTED Final   Candida tropicalis NOT DETECTED NOT DETECTED Final  Aerobic/Anaerobic Culture (surgical/deep wound)     Status: None (Preliminary result)   Collection Time: 12/20/16  4:34 PM  Result Value Ref Range Status   Specimen Description WOUND  PLEURAL LEFT  Final   Special Requests Normal  Final   Gram Stain   Final    ABUNDANT WBC PRESENT, PREDOMINANTLY PMN MODERATE GRAM POSITIVE COCCI IN CLUSTERS    Culture   Final    ABUNDANT METHICILLIN RESISTANT STAPHYLOCOCCUS AUREUS NO ANAEROBES ISOLATED; CULTURE IN PROGRESS FOR 5 DAYS    Report Status PENDING  Incomplete   Organism ID, Bacteria METHICILLIN RESISTANT STAPHYLOCOCCUS AUREUS  Final      Susceptibility   Methicillin resistant staphylococcus aureus - MIC*    CIPROFLOXACIN >=8 RESISTANT Resistant     ERYTHROMYCIN <=0.25 SENSITIVE Sensitive     GENTAMICIN <=0.5 SENSITIVE Sensitive     OXACILLIN >=4 RESISTANT Resistant     TETRACYCLINE <=1 SENSITIVE Sensitive     VANCOMYCIN 1 SENSITIVE Sensitive     TRIMETH/SULFA <=10 SENSITIVE Sensitive     CLINDAMYCIN <=0.25 SENSITIVE Sensitive     RIFAMPIN <=0.5 SENSITIVE Sensitive     Inducible Clindamycin NEGATIVE Sensitive     * ABUNDANT METHICILLIN RESISTANT STAPHYLOCOCCUS AUREUS  MRSA PCR Screening     Status: Abnormal   Collection Time: 12/21/16  7:59 AM  Result Value Ref Range Status   MRSA by PCR POSITIVE (A) NEGATIVE Final    Comment:        The GeneXpert MRSA Assay (FDA approved for NASAL specimens only), is one component of a comprehensive MRSA colonization surveillance program. It is not intended to diagnose MRSA infection nor to guide or monitor treatment for MRSA infections. RESULT CALLED TO, READ BACK BY AND VERIFIED  WITH: T.MAINIERL RN AT 1025 12/21/16 BY A.DAVIS          Radiology Studies: Dg Chest Port 1 View  Result Date: 12/22/2016 CLINICAL DATA:  Chest tube. EXAM: PORTABLE CHEST 1 VIEW COMPARISON:  12/21/2016 .  CT 12/20/2016. FINDINGS: Left chest tube in stable position. Loculated left apical pleural effusion improved from prior exam. Mild bibasilar subsegmental atelectasis. Stable cardiomegaly. IMPRESSION: 1. Left chest tube in stable position. Loculated left sided pleural effusion again noted,  interim improvement from prior exam. No pneumothorax. 2.  Stable cardiomegaly. Electronically Signed   By: Maisie Fus  Register   On: 12/22/2016 07:23   Dg Chest Port 1 View  Result Date: 12/21/2016 CLINICAL DATA:  Empyema. EXAM: PORTABLE CHEST 1 VIEW COMPARISON:  CT 12/20/2016 FINDINGS: Loculated left pleural effusion again noted, similar to prior CT. Left pleural pigtail drainage catheter noted. No pneumothorax. Diffuse bilateral airspace disease. Cardiomegaly. IMPRESSION: Large loculated left pleural effusion with left pleural drainage catheter in place. No pneumothorax. Bilateral airspace disease. Electronically Signed   By: Charlett Nose M.D.   On: 12/21/2016 10:04   Ct Image Guided Fluid Drain By Catheter  Result Date: 12/20/2016 INDICATION: Left empyema EXAM: CT GUIDED 14 FRENCH LEFT CHEST TUBE INSERTION MEDICATIONS: The patient is currently admitted to the hospital and receiving intravenous antibiotics. The antibiotics were administered within an appropriate time frame prior to the initiation of the procedure. ANESTHESIA/SEDATION: Fentanyl 100 mcg IV; Versed 2.0 mg IV Moderate Sedation Time:  10 minutes The patient was continuously monitored during the procedure by the interventional radiology nurse under my direct supervision. COMPLICATIONS: None immediate. PROCEDURE: Informed written consent was obtained from the patient after a thorough discussion of the procedural risks, benefits and alternatives. All questions were addressed. Maximal Sterile Barrier Technique was utilized including caps, mask, sterile gowns, sterile gloves, sterile drape, hand hygiene and skin antiseptic. A timeout was performed prior to the initiation of the procedure. Previous imaging reviewed. Patient positioned slightly left anterior oblique. Noncontrast localization CT performed. The loculated complex left pleural effusion was localized. A mid intercostal space was marked in the mid axillary line. Under sterile conditions and  local anesthesia, an 18 gauge 10 cm access needle was advanced under CT guidance into the left chest fluid collection. Needle position confirmed with CT. Syringe aspiration yielded purulent fluid. Amplatz guidewire inserted followed by tract dilatation to insert 14 [place] drain. Drain catheter position confirmed with CT. Catheter secured with a Prolene suture and connected to Pleur-Evac externally. Sterile dressing applied. Patient tolerated the procedure well. No immediate complication. IMPRESSION: Successful CT-guided left chest 14 French drain for empyema. Culture sent. Electronically Signed   By: Judie Petit.  Shick M.D.   On: 12/20/2016 16:30      Scheduled Meds: . insulin aspart  0-15 Units Subcutaneous Q4H  . insulin glargine  30 Units Subcutaneous Daily  . nicotine  21 mg Transdermal Daily  . oxyCODONE  5 mg Oral 6 X Daily  . senna-docusate  2 tablet Oral Daily   Continuous Infusions: . cefTRIAXone (ROCEPHIN)  IV Stopped (12/21/16 1430)  . vancomycin Stopped (12/22/16 0643)     LOS: 2 days    Time spent in minutes: 60 min at minimal in intervals from 7:30 AM until now 5: 25 PM - greater than 50 % of time spent speaking with patient, father grandmother, multiple nurses, the physician who admitting him, reviewing chart from [facility], calling consults and following up imaging and labs.     Calvert Cantor, MD [facility] Pager: [email or web address] Password [facility] 12/22/2016, 11:41 AM

## 2016-12-22 NOTE — Progress Notes (Signed)
Patient ID: Keith Riggs, male   DOB: 24-May-1991, 26 y.o.   MRN: [id number]    Referring Physician(s): Dr. Sheliah Plane  Supervising Physician: Malachy Moan  Patient Status: [facility] - In-pt  Chief Complaint: Left chest empyema  Subjective: Patient is mostly sleeping.  Sore around drain site, but otherwise no complaints currently.  Allergies: Bee venom; Voltaren [diclofenac sodium]; and Latex  Medications: Prior to Admission medications   Medication Sig Start Date End Date Taking? Authorizing Provider  cephALEXin (KEFLEX) 500 MG capsule Take 500 mg by mouth 4 (four) times daily. 12/15/16  Yes [provider]  HYDROcodone-acetaminophen (NORCO/VICODIN) 5-325 MG tablet Take 1 tablet by mouth every 4 (four) hours as needed. for pain 12/17/16  Yes [provider]  Insulin Glargine (TOUJEO MAX SOLOSTAR) 300 UNIT/ML SOPN Inject 40 Units into the skin daily.   Yes [provider]  metFORMIN (GLUCOPHAGE) 500 MG tablet Take 500 mg by mouth 2 (two) times daily with a meal.   Yes [provider]    Vital Signs: BP 136/69 (BP Location: Left Arm)   Pulse (!) 116   Temp 98.9 F (37.2 C) (Oral)   Resp (!) 25   Ht 5\' 11"  (1.803 m)   Wt 237 lb (107.5 kg)   SpO2 96%   BMI 33.05 kg/m   Physical Exam: Chest: left chest tube in place with some serous, brown colored output. 470cc yesterday.  Drain site is c/d/i but sore to touch as expected.  No air leak.  Imaging: Ct Chest W Contrast  Result Date: 12/20/2016 CLINICAL DATA:  Worsening left back pain over the past 2 weeks with nausea and vomiting. Thoracic spine MR earlier today demonstrating multifocal epidural abscess with paraspinal myositis and paraspinal muscle abscesses and bilateral pleural empyema with probable pneumonia and possible lung abscesses. Chest CT was recommended. 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 EXAM: CT CHEST, ABDOMEN, AND PELVIS WITH CONTRAST TECHNIQUE:  Multidetector CT imaging of the chest, abdomen and pelvis was performed following the standard protocol during bolus administration of intravenous contrast. CONTRAST:  ISOVUE-300 IOPAMIDOL (ISOVUE-300) INJECTION 61% COMPARISON:  Thoracic spine MR obtained earlier today. FINDINGS: CT CHEST FINDINGS Cardiovascular: No significant vascular findings. Normal heart size. No pericardial effusion. Mediastinum/Nodes: Prominent AP window lymph node with a short axis diameter of 11 mm on image number 27 of series 3. Mildly prominent precarinal node with a short axis diameter of 8 mm on image number 46 of series 3. Lungs/Pleura: Large number of bilateral lung nodules, most numerous on the right. Some of these demonstrate central cavitation. The largest on the left is at the medial left lung apex, measuring 1.7 cm in maximum diameter on image number 39 of series 4. The largest on the right is in the posterior aspect of the right upper lobe, with a maximum diameter of 1.6 cm on image number 53 of series 4. Small right pleural effusion and mild associated right lower lobe atelectasis. Moderate-sized multiloculated left pleural effusion and moderate compressive and dependent atelectasis. Musculoskeletal: Previously described left paraspinal muscle abscess. This is largest on image number 32 of series 3, measuring 3.9 x 2.3 cm on that image. There is a more discrete abscess with rim enhancement on the left on image number 36 of series 3, measuring 2.0 x 1.4 cm. The previously demonstrated multifocal epidural abscess is better visualized on the MR images. No bone destruction seen. Mild thoracic and lower cervical spine degenerative changes. CT ABDOMEN PELVIS FINDINGS Hepatobiliary:  No focal liver abnormality is seen. No gallstones, gallbladder wall thickening, or biliary dilatation. Pancreas: Unremarkable. No pancreatic ductal dilatation or surrounding inflammatory changes. Spleen: Normal in size without focal abnormality.  Adrenals/Urinary Tract: Both kidneys are mildly enlarged with poorly defined areas low density. There is a very small medial right perinephric fluid collection measuring 1.9 x 1.1 cm on image number 76 series 3. Minimal bilateral perinephric soft tissue stranding. Unremarkable ureters, urinary bladder and adrenal glands. No hydronephrosis. Stomach/Bowel: Stomach is within normal limits. Appendix appears normal. No evidence of bowel wall thickening, distention, or inflammatory changes. Vascular/Lymphatic: No significant vascular findings are present. No enlarged abdominal or pelvic lymph nodes. Reproductive: Prostate is unremarkable. Other: Very small bilateral inguinal hernias containing fat. Musculoskeletal: The left paraspinal muscle abscesses extend to the level of the upper abdomen. The largest portion of the abscess at that level measures 4.3 x 2.7 cm on image number 58 of series 3. No bone destruction is seen. An L1 vertebral body hemangioma is incidentally noted. There is soft tissue gas in the inferior, medial left buttock region subcutaneous fat on the most inferior images with no fluid collection seen on those images. IMPRESSION: 1. Multiple cavitary nodules in both lungs, compatible with septic emboli. 2. Moderate-sized multiloculated left pleural effusion. The multiple loculations are compatible with empyema formation. 3. Previously described epidural and left paraspinal abscesses. 4. Bilateral lung atelectasis, greater on the left. 5. Soft tissue gas in the inferior, medial left buttock region, compatible with the patient's known groin abscesses. 6. Evidence of bilateral pyelonephritis with a small right perinephric abscess or small flattened exophytic cyst. 7. Minimal mediastinal adenopathy, most likely reactive. Electronically Signed   By: Beckie SaltsSteven  Reid M.D.   On: 12/20/2016 09:11   Mr Thoracic Spine W Wo Contrast  Result Date: 12/20/2016 CLINICAL DATA:  Severe mid back pain for 2 weeks. History of  diabetes , intravenous drug use and recent groin abscess. EXAM: MRI THORACIC WITHOUT AND WITH CONTRAST TECHNIQUE: Multiplanar and multiecho pulse sequences of the thoracic spine were obtained without and with intravenous contrast. CONTRAST:  20mL MULTIHANCE GADOBENATE DIMEGLUMINE 529 MG/ML IV SOLN COMPARISON:  Thoracic spine radiograph December 15, 2016 FINDINGS: Generally poor signal to noise ratio due to habitus and motion. The axial sequences of the lower thoracic spine are nondiagnostic. ALIGNMENT: Straightened thoracic kyphosis. No malalignment. VERTEBRAE/DISCS: Vertebral bodies are intact. Intervertebral discs morphology and signal are normal. Faintly increased STIR signal and enhancement T6 vertebral body. No abnormal disc enhancement. CORD: 1.7 x 1.3 x 7.8 cm (transverse by AP by CC) ventral epidural abscess from T4-5 through T8-9 resulting in severe cord compression T5-6 through T7 nondiagnostic examination for spinal cord edema. No syrinx. LEFT ventral epidural abscess from T2 to T5-6, 7.1 cm in cranial caudad dimension resulting in mild cord compression. PREVERTEBRAL AND PARASPINAL SOFT TISSUES: Partially imaged lobulated, potentially loculated LEFT pleural effusion. Patchy LEFT lung consolidation. T2 bright signal within the bilateral paraspinal muscles compatible with mild bursitis. Complex 4.5 x 2.5 x 13.8 cm LEFT paraspinal muscle abscess from T5 through T12. 0.8 x 0.8 x 8.3 cm RIGHT paraspinal muscle abscess at the level of the epidural collection. Prevertebral subpleural abscesses bilaterally measuring to at least 1.4 x 2.4 cm on the LEFT. IMPRESSION: 1. Limited examination, nondiagnostic evaluation through the lower thoracic spine. 2. 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 No syrinx. 3. 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  4. Paraspinal myositis with paraspinal muscle abscesses, LEFT greater than  RIGHT. 5. Bilateral pleural empyema and, probable pneumonia. Lung abscess is possible. Recommend CT chest with contrast. Critical Value/emergent results were called by telephone at the time of interpretation on 12/20/2016 at 5:21 am to Dr. Azalia Bilis, ED, who verbally acknowledged these results. Electronically Signed   By: Awilda Metro M.D.   On: 12/20/2016 05:25   Ct Abdomen Pelvis W Contrast  Result Date: 12/20/2016 CLINICAL DATA:  Worsening left back pain over the past 2 weeks with nausea and vomiting. Thoracic spine MR earlier today demonstrating multifocal epidural abscess with paraspinal myositis and paraspinal muscle abscesses and bilateral pleural empyema with probable pneumonia and possible lung abscesses. Chest CT was recommended. 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 EXAM: CT CHEST, ABDOMEN, AND PELVIS WITH CONTRAST TECHNIQUE: Multidetector CT imaging of the chest, abdomen and pelvis was performed following the standard protocol during bolus administration of intravenous contrast. CONTRAST:  ISOVUE-300 IOPAMIDOL (ISOVUE-300) INJECTION 61% COMPARISON:  Thoracic spine MR obtained earlier today. FINDINGS: CT CHEST FINDINGS Cardiovascular: No significant vascular findings. Normal heart size. No pericardial effusion. Mediastinum/Nodes: Prominent AP window lymph node with a short axis diameter of 11 mm on image number 27 of series 3. Mildly prominent precarinal node with a short axis diameter of 8 mm on image number 46 of series 3. Lungs/Pleura: Large number of bilateral lung nodules, most numerous on the right. Some of these demonstrate central cavitation. The largest on the left is at the medial left lung apex, measuring 1.7 cm in maximum diameter on image number 39 of series 4. The largest on the right is in the posterior aspect of the right upper lobe, with a maximum diameter of 1.6 cm on image number 53 of series 4. Small right pleural effusion and mild associated  right lower lobe atelectasis. Moderate-sized multiloculated left pleural effusion and moderate compressive and dependent atelectasis. Musculoskeletal: Previously described left paraspinal muscle abscess. This is largest on image number 32 of series 3, measuring 3.9 x 2.3 cm on that image. There is a more discrete abscess with rim enhancement on the left on image number 36 of series 3, measuring 2.0 x 1.4 cm. The previously demonstrated multifocal epidural abscess is better visualized on the MR images. No bone destruction seen. Mild thoracic and lower cervical spine degenerative changes. CT ABDOMEN PELVIS FINDINGS Hepatobiliary: No focal liver abnormality is seen. No gallstones, gallbladder wall thickening, or biliary dilatation. Pancreas: Unremarkable. No pancreatic ductal dilatation or surrounding inflammatory changes. Spleen: Normal in size without focal abnormality. Adrenals/Urinary Tract: Both kidneys are mildly enlarged with poorly defined areas low density. There is a very small medial right perinephric fluid collection measuring 1.9 x 1.1 cm on image number 76 series 3. Minimal bilateral perinephric soft tissue stranding. Unremarkable ureters, urinary bladder and adrenal glands. No hydronephrosis. Stomach/Bowel: Stomach is within normal limits. Appendix appears normal. No evidence of bowel wall thickening, distention, or inflammatory changes. Vascular/Lymphatic: No significant vascular findings are present. No enlarged abdominal or pelvic lymph nodes. Reproductive: Prostate is unremarkable. Other: Very small bilateral inguinal hernias containing fat. Musculoskeletal: The left paraspinal muscle abscesses extend to the level of the upper abdomen. The largest portion of the abscess at that level measures 4.3 x 2.7 cm on image number 58 of series 3. No bone destruction is seen. An L1 vertebral body hemangioma is incidentally noted. There is soft tissue gas in the inferior, medial left buttock region subcutaneous  fat on  the most inferior images with no fluid collection seen on those images. IMPRESSION: 1. Multiple cavitary nodules in both lungs, compatible with septic emboli. 2. Moderate-sized multiloculated left pleural effusion. The multiple loculations are compatible with empyema formation. 3. Previously described epidural and left paraspinal abscesses. 4. Bilateral lung atelectasis, greater on the left. 5. Soft tissue gas in the inferior, medial left buttock region, compatible with the patient's known groin abscesses. 6. Evidence of bilateral pyelonephritis with a small right perinephric abscess or small flattened exophytic cyst. 7. Minimal mediastinal adenopathy, most likely reactive. Electronically Signed   By: Beckie Salts M.D.   On: 12/20/2016 09:11   Dg Chest Port 1 View  Result Date: 12/22/2016 CLINICAL DATA:  Chest tube. EXAM: PORTABLE CHEST 1 VIEW COMPARISON:  12/21/2016 .  CT 12/20/2016. FINDINGS: Left chest tube in stable position. Loculated left apical pleural effusion improved from prior exam. Mild bibasilar subsegmental atelectasis. Stable cardiomegaly. IMPRESSION: 1. Left chest tube in stable position. Loculated left sided pleural effusion again noted, interim improvement from prior exam. No pneumothorax. 2.  Stable cardiomegaly. Electronically Signed   By: Maisie Fus  Register   On: 12/22/2016 07:23   Dg Chest Port 1 View  Result Date: 12/21/2016 CLINICAL DATA:  Empyema. EXAM: PORTABLE CHEST 1 VIEW COMPARISON:  CT 12/20/2016 FINDINGS: Loculated left pleural effusion again noted, similar to prior CT. Left pleural pigtail drainage catheter noted. No pneumothorax. Diffuse bilateral airspace disease. Cardiomegaly. IMPRESSION: Large loculated left pleural effusion with left pleural drainage catheter in place. No pneumothorax. Bilateral airspace disease. Electronically Signed   By: Charlett Nose M.D.   On: 12/21/2016 10:04   Ct Image Guided Fluid Drain By Catheter  Result Date: 12/20/2016 INDICATION: Left  empyema EXAM: CT GUIDED 14 FRENCH LEFT CHEST TUBE INSERTION MEDICATIONS: The patient is currently admitted to the hospital and receiving intravenous antibiotics. The antibiotics were administered within an appropriate time frame prior to the initiation of the procedure. ANESTHESIA/SEDATION: Fentanyl 100 mcg IV; Versed 2.0 mg IV Moderate Sedation Time:  10 minutes The patient was continuously monitored during the procedure by the interventional radiology nurse under my direct supervision. COMPLICATIONS: None immediate. PROCEDURE: Informed written consent was obtained from the patient after a thorough discussion of the procedural risks, benefits and alternatives. All questions were addressed. Maximal Sterile Barrier Technique was utilized including caps, mask, sterile gowns, sterile gloves, sterile drape, hand hygiene and skin antiseptic. A timeout was performed prior to the initiation of the procedure. Previous imaging reviewed. Patient positioned slightly left anterior oblique. Noncontrast localization CT performed. The loculated complex left pleural effusion was localized. A mid intercostal space was marked in the mid axillary line. Under sterile conditions and local anesthesia, an 18 gauge 10 cm access needle was advanced under CT guidance into the left chest fluid collection. Needle position confirmed with CT. Syringe aspiration yielded purulent fluid. Amplatz guidewire inserted followed by tract dilatation to insert 14 [place] drain. Drain catheter position confirmed with CT. Catheter secured with a Prolene suture and connected to Pleur-Evac externally. Sterile dressing applied. Patient tolerated the procedure well. No immediate complication. IMPRESSION: Successful CT-guided left chest 14 French drain for empyema. Culture sent. Electronically Signed   By: Judie Petit.  Shick M.D.   On: 12/20/2016 16:30    Labs:  CBC:  Recent Labs  12/20/16 0011 12/21/16 0242 12/22/16 0211  WBC 26.5* 26.0* 25.6*  HGB 10.3*  10.7* 10.9*  HCT 31.6* 32.9* 33.6*  PLT 529* 544* 610*    COAGS: No results for input(s):  INR, APTT in the last 8760 hours.  BMP:  Recent Labs  12/20/16 1939 12/21/16 0242 12/21/16 1847 12/22/16 0211  NA 127* 127* 124* 132*  K 3.2* 3.0* 3.6 3.5  CL 93* 93* 90* 94*  CO2 25 27 26 28   GLUCOSE 250* 220* 260* 193*  BUN <5* <5* <5* <5*  CALCIUM 7.8* 7.9* 7.9* 8.2*  CREATININE 0.55* 0.49* 0.60* 0.50*  GFRNONAA >60 >60 >60 >60  GFRAA >60 >60 >60 >60    LIVER FUNCTION TESTS:  Recent Labs  12/20/16 0909  PROT 5.9*    Assessment and Plan: 1. Left empyema, s/p chest tube placement on 7/14 -CXs show MRSA in chest fluid -cont chest drain for now -follow TCTS recommendation -cont drain to suction given output -will follow  Electronically Signed: Chrysten Woulfe E 12/22/2016, 10:58 AM   I spent a total of 15 Minutes at the the patient's bedside AND on the patient's hospital floor or unit, greater than 50% of which was counseling/coordinating care for left empyema

## 2016-12-22 NOTE — Progress Notes (Signed)
[facility]   Reason for visit: Follow up on disseminated MRSA infection  Interval History: still in pain but breathing more deeply now without too much pain, Tmax 100.3.  No chills.  Day 6 antibiotics Day 3 vancomycin Day 3 ceftriaxone  Physical Exam: Constitutional:  Vitals:   12/22/16 0250 12/22/16 0352  BP:  136/69  Pulse:    Resp:  (!) 25  Temp: 97.7 F (36.5 C) 98.9 F (37.2 C)   patient appears in some mild distress with pain Eyes: anicteric HENT: no thrush Respiratory: Normal respiratory effort though somewhat shallow breaths; CTA B Chest: tube in place Cardiovascular: RRR GI: soft, nt, nd  Review of Systems: Constitutional: negative for fevers and chills Respiratory: positive for pleurisy/chest pain, negative for cough Gastrointestinal: negative for diarrhea  Lab Results  Component Value Date   WBC 25.6 (H) 12/22/2016   HGB 10.9 (L) 12/22/2016   HCT 33.6 (L) 12/22/2016   MCV 83.0 12/22/2016   PLT 610 (H) 12/22/2016    Lab Results  Component Value Date   CREATININE 0.50 (L) 12/22/2016   BUN <5 (L) 12/22/2016   NA 132 (L) 12/22/2016   K 3.5 12/22/2016   CL 94 (L) 12/22/2016   CO2 28 12/22/2016   No results found for: ALT, AST, GGT, ALKPHOS   Microbiology: Recent Results (from the past 240 hour(s))  Blood culture (routine x 2)     Status: None (Preliminary result)   Collection Time: 12/20/16 12:01 AM  Result Value Ref Range Status   Specimen Description BLOOD RIGHT HAND  Final   Special Requests   Final    BOTTLES DRAWN AEROBIC AND ANAEROBIC Blood Culture adequate volume   Culture  Setup Time   Final    GRAM POSITIVE COCCI IN CLUSTERS ANAEROBIC BOTTLE ONLY CRITICAL RESULT CALLED TO, READ BACK BY AND VERIFIED WITH: K. COOK PHARMD, AT 0701 12/22/16 BY D.VANHOOK    Culture GRAM POSITIVE COCCI  Final   Report Status PENDING  Incomplete  Blood culture (routine x 2)     Status: Abnormal (Preliminary result)   Collection  Time: 12/20/16 12:20 AM  Result Value Ref Range Status   Specimen Description BLOOD LEFT ANTECUBITAL  Final   Special Requests   Final    BOTTLES DRAWN AEROBIC AND ANAEROBIC Blood Culture adequate volume   Culture  Setup Time   Final    GRAM POSITIVE COCCI IN CLUSTERS ANAEROBIC BOTTLE ONLY CRITICAL RESULT CALLED TO, READ BACK BY AND VERIFIED WITH: Patrick North, PHARMD 2111 12/21/2016 T. TYSOR    Culture (A)  Final    STAPHYLOCOCCUS AUREUS SUSCEPTIBILITIES TO FOLLOW    Report Status PENDING  Incomplete  Blood Culture ID Panel (Reflexed)     Status: Abnormal   Collection Time: 12/20/16 12:20 AM  Result Value Ref Range Status   Enterococcus species NOT DETECTED NOT DETECTED Final   Listeria monocytogenes NOT DETECTED NOT DETECTED Final   Staphylococcus species DETECTED (A) NOT DETECTED Final    Comment: CRITICAL RESULT CALLED TO, READ BACK BY AND VERIFIED WITH: Patrick North, PHARMD 2111 12/21/2016 T. TYSOR    Staphylococcus aureus DETECTED (A) NOT DETECTED Final    Comment: Methicillin (oxacillin)-resistant Staphylococcus aureus (MRSA). MRSA is predictably resistant to beta-lactam antibiotics (except ceftaroline). Preferred therapy is vancomycin unless clinically contraindicated. Patient requires contact precautions if  hospitalized. CRITICAL RESULT CALLED TO, READ BACK BY AND VERIFIED WITH: Patrick North, PHARMD 2111 12/21/2016 T. TYSOR    Methicillin  resistance DETECTED (A) NOT DETECTED Final    Comment: CRITICAL RESULT CALLED TO, READ BACK BY AND VERIFIED WITH: Patrick North, PHARMD 2111 12/21/2016 T. TYSOR    Streptococcus species NOT DETECTED NOT DETECTED Final   Streptococcus agalactiae NOT DETECTED NOT DETECTED Final   Streptococcus pneumoniae NOT DETECTED NOT DETECTED Final   Streptococcus pyogenes NOT DETECTED NOT DETECTED Final   Acinetobacter baumannii NOT DETECTED NOT DETECTED Final   Enterobacteriaceae species NOT DETECTED NOT DETECTED Final   Enterobacter cloacae  complex NOT DETECTED NOT DETECTED Final   Escherichia coli NOT DETECTED NOT DETECTED Final   Klebsiella oxytoca NOT DETECTED NOT DETECTED Final   Klebsiella pneumoniae NOT DETECTED NOT DETECTED Final   Proteus species NOT DETECTED NOT DETECTED Final   Serratia marcescens NOT DETECTED NOT DETECTED Final   Haemophilus influenzae NOT DETECTED NOT DETECTED Final   Neisseria meningitidis NOT DETECTED NOT DETECTED Final   Pseudomonas aeruginosa NOT DETECTED NOT DETECTED Final   Candida albicans NOT DETECTED NOT DETECTED Final   Candida glabrata NOT DETECTED NOT DETECTED Final   Candida krusei NOT DETECTED NOT DETECTED Final   Candida parapsilosis NOT DETECTED NOT DETECTED Final   Candida tropicalis NOT DETECTED NOT DETECTED Final  Aerobic/Anaerobic Culture (surgical/deep wound)     Status: None (Preliminary result)   Collection Time: 12/20/16  4:34 PM  Result Value Ref Range Status   Specimen Description WOUND PLEURAL LEFT  Final   Special Requests Normal  Final   Gram Stain   Final    ABUNDANT WBC PRESENT, PREDOMINANTLY PMN MODERATE GRAM POSITIVE COCCI IN CLUSTERS    Culture   Final    ABUNDANT METHICILLIN RESISTANT STAPHYLOCOCCUS AUREUS NO ANAEROBES ISOLATED; CULTURE IN PROGRESS FOR 5 DAYS    Report Status PENDING  Incomplete   Organism ID, Bacteria METHICILLIN RESISTANT STAPHYLOCOCCUS AUREUS  Final      Susceptibility   Methicillin resistant staphylococcus aureus - MIC*    CIPROFLOXACIN >=8 RESISTANT Resistant     ERYTHROMYCIN <=0.25 SENSITIVE Sensitive     GENTAMICIN <=0.5 SENSITIVE Sensitive     OXACILLIN >=4 RESISTANT Resistant     TETRACYCLINE <=1 SENSITIVE Sensitive     VANCOMYCIN 1 SENSITIVE Sensitive     TRIMETH/SULFA <=10 SENSITIVE Sensitive     CLINDAMYCIN <=0.25 SENSITIVE Sensitive     RIFAMPIN <=0.5 SENSITIVE Sensitive     Inducible Clindamycin NEGATIVE Sensitive     * ABUNDANT METHICILLIN RESISTANT STAPHYLOCOCCUS AUREUS  MRSA PCR Screening     Status: Abnormal    Collection Time: 12/21/16  7:59 AM  Result Value Ref Range Status   MRSA by PCR POSITIVE (A) NEGATIVE Final    Comment:        The GeneXpert MRSA Assay (FDA approved for NASAL specimens only), is one component of a comprehensive MRSA colonization surveillance program. It is not intended to diagnose MRSA infection nor to guide or monitor treatment for MRSA infections. RESULT CALLED TO, READ BACK BY AND VERIFIED WITH: T.MAINIERL RN AT 1025 12/21/16 BY A.DAVIS     Impression/Plan:  1. MRSA bacteremia - will need a prolonged course of vancomycin.  Will need to repeat blood cultures in 1-2 days to assure clearance TTE without vegetation Could do TEE but he will be requiriing prolonged IV antibiotics so will not change his treatment I will d/c ceftriaxone  2.  Lung nodules with empyema - s/p drainage by IR.  Also grew MRSA as expected.    3.  Discitis with epidural abscess -  no surgical decompression indicated.  On vancomycin and will need a prolonged course.

## 2016-12-22 NOTE — Progress Notes (Signed)
Inpatient Diabetes Program Recommendations  AACE/ADA: New Consensus Statement on Inpatient Glycemic Control (2015)  Target Ranges:  Prepandial:   less than 140 mg/dL      Peak postprandial:   less than 180 mg/dL (1-2 hours)      Critically ill patients:  140 - 180 mg/dL   Results for Keith Riggs, Keith Riggs (MRN [id number]) as of 12/22/2016 12:41  Ref. Range 12/21/2016 08:21 12/21/2016 11:09 12/21/2016 16:11 12/21/2016 20:41 12/21/2016 23:43 12/22/2016 03:51 12/22/2016 08:16 12/22/2016 11:49  Glucose-Capillary Latest Ref Range: 65 - 99 mg/dL 914271 (H) 782249 (H) 956209 (H) 231 (H) 233 (H) 167 (H) 284 (H) 297 (H)   Review of Glycemic Control  Diabetes history: DM2 Outpatient Diabetes medications: Toujeo 40 units daily, Metformin 500 mg BID Current orders for Inpatient glycemic control: Lantus 36 units daily, Novolog 0-15 units Q4H  Inpatient Diabetes Program Recommendations: Insulin - Basal: Note Lantus was increased to 36 units daily today. Insulin - Meal Coverage: Please consider ordering Novolog 5 units TID with meals for meal coverage if patient eats at least 50% of meals. HgbA1C: Please consider ordering an A1C to evaluate glycemic control over the past 2-3 months.  Thanks, Keith PennerMarie Nathan Moctezuma, RN, MSN, CDE Diabetes Coordinator Inpatient Diabetes Program [phone] (Team Pager from 8am to 5pm)

## 2016-12-22 NOTE — Progress Notes (Signed)
Pharmacy Antibiotic Note  Keith Riggs is a 26 y.o. male admitted on day # 6 antibiotics for disseminated MRSA infection:  bacteremia, empyema, and discitis with epidural abscess. Pharmacy has been consulted for Vancomycin dosing. Also on Ceftriaxone 2gm IV q24hrs.  Daptomycin ~ 8 mg/kg was given x 1 today, considering severity of infection and subtherapeutic Vanc trough levels.    Vancomycin trough level tonight is 5 mcg/ml on 1 gram IV q6hrs. Remains subtherapeutic.  It appears we will be unable to achieve target trough levels of 15-20 mcg/ml with a reasonable vanc dose and administration schedule, as higher doses require longer infusion times.   Discussed Vanc and Dapto with Dr. Luciana Axeomer.    Plan:  Discontinue Vancomycin.  Continue Daptomycin ~8 mg/kg (860 mg) IV q24hrs.  Weekly CK while on Daptomycin.  Continues on Ceftriaxone 2gm IV q24hrs; noted plan to discontinue.   Follow renal function, final culture data, clinical progress and antibiotic plans.    Height: 5\' 11"  (180.3 cm) Weight: 237 lb (107.5 kg) IBW/kg (Calculated) : 75.3  Temp (24hrs), Avg:99.1 F (37.3 C), Min:97.7 F (36.5 C), Max:100.3 F (37.9 C)   Recent Labs Lab 12/20/16 0011  12/20/16 0909 12/20/16 1939 12/21/16 0242 12/21/16 1847 12/22/16 0211 12/22/16 1648  WBC 26.5*  --   --   --  26.0*  --  25.6*  --   CREATININE 0.56*  < > 0.64 0.55* 0.49* 0.60* 0.50*  --   VANCOTROUGH  --   --   --   --   --  <4*  --  5*  < > = values in this interval not displayed.  Estimated Creatinine Clearance: 171.5 mL/min (A) (by C-G formula based on SCr of 0.5 mg/dL (L)).    Allergies  Allergen Reactions  . Bee Venom Anaphylaxis  . Voltaren [Diclofenac Sodium] Other (See Comments)    "Makes me feel not myself"  . Latex Rash    Antimicrobials this admission:  (cephalexin as outpatient prior to admission)  Zosyn 7/14 >> x1  Vancomycin 7/14>>7/16  Ceftriaxone 7/14>>  Daptomycin 7/16>>  Dose adjustments this  admission:  7/15 VT < 4 mcg/ml on Vanc 1gm IV q8h - > incr to 1 gm IV q6h  716 VT 5 mcg/ml on Vanc 1gm IV q6h -> change to Daptomycin  Microbiology results: 7/15 MRSA PCR: Positive 7/15 BCID - Staph aureus, mecA gene detected 7/14 left pleural wound Cx: abundant MRSA 7/14 Blood x 2 - Staph aureus, susceptibilities to follow    Thank you for allowing pharmacy to be a part of this patient's care.  Dennie Fettersgan, Ascher Schroepfer Donovan, [place] Pager: [phone] 12/22/2016 6:59 PM

## 2016-12-22 NOTE — Progress Notes (Signed)
  PHARMACY - CRITICAL VALUE ALERT   2/2 blood cultures from 7/14 resulting with GPCs in clusters. BCID from 7/14 with MRSA. Patient is on appropriate therapy with ceftriaxone and vancomycin.   Changes to prescribed antibiotics required: No change   York CeriseKatherine Cook, PharmD Clinical Pharmacist 12/22/16 7:04 AM

## 2016-12-22 NOTE — Progress Notes (Addendum)
      [address]       [name] [id number]             [phone]      Subjective:  [name] complains of pain this morning.  He states the IV medication would help, but now they are just giving him a tablet every 4 hours which doesn't provide relief.  Objective: Vital signs in last 24 hours: Temp:  [97.7 F (36.5 C)-100.3 F (37.9 C)] 98.9 F (37.2 C) (07/16 0352) Cardiac Rhythm: Sinus tachycardia (07/16 0700) Resp:  [18-35] 25 (07/16 0352) BP: (114-136)/(64-78) 136/69 (07/16 0352) SpO2:  [93 %-98 %] 96 % (07/16 0352)  Intake/Output from previous day: 07/15 0701 - 07/16 0700 In: 1080 [P.O.:1080] Out: 7790 [Urine:7670; Drains:120]  General appearance: alert, cooperative and no distress Heart: regular rate and rhythm Lungs: clear to auscultation bilaterally Abdomen: soft, non-tender; bowel sounds normal; no masses,  no organomegaly Wound: clean andd ry  Lab Results:  Recent Labs  12/21/16 0242 12/22/16 0211  WBC 26.0* 25.6*  HGB 10.7* 10.9*  HCT 32.9* 33.6*  PLT 544* 610*   BMET:  Recent Labs  12/21/16 1847 12/22/16 0211  NA 124* 132*  K 3.6 3.5  CL 90* 94*  CO2 26 28  GLUCOSE 260* 193*  BUN <5* <5*  CREATININE 0.60* 0.50*  CALCIUM 7.9* 8.2*    PT/INR: No results for input(s): LABPROT, INR in the last 72 hours. ABG No results found for: PHART, HCO3, TCO2, ACIDBASEDEF, O2SAT CBG (last 3)   Recent Labs  12/22/16 0351 12/22/16 0816 12/22/16 1149  GLUCAP 167* 284* 297*    Assessment/Plan:  1. Suspected left empyema- CT guided chest tube place, improvement of effusion on CXR- continue chest tube for now 2. Pain control- patient has IV and oral medications ordered, per primary 3. Dispo- leave chest tube in place, good drainage since placement, care per primary  LOS: 2 days    BARRETT, ERIN 12/22/2016  Chest xray this am improved, still some left upper outer chest fluid. Follow up xray in am, may need repeat ct and  drainage of left upp anterior fluid collection , I have seen and examined Lillia MountainKameron Uno and agree with the above assessment  and plan.  Delight OvensEdward B Drago Hammonds MD Beeper [phone] Office [phone] 12/22/2016 1:59 PM

## 2016-12-22 NOTE — Consult Note (Signed)
WOC Nurse wound consult note Reason for Consult: Consult requested for left and right groin; pt had I&D performed 1-2 weeks ago, according to the EMR. Wound type: Full thickness wounds to bilat groins Measurement: Left inner groin; .5X.5X1cm, beefy red, mod amt pink drainage, no odor Right inner groin; 3X1X.2cm, beefy red, mod amt pink drainage, no odor Periwound: Intact skin surrounding, no erythremia or fluctuance Dressing procedure/placement/frequency: Continue present plan of care as previously ordered by the surgical team, according to patient, with moist gauze packing Q day and dry gauze and tape over the sites. Please re-consult if further assistance is needed.  Thank-you,  Cammie Mcgeeawn Warnie Belair MSN, RN, CWOCN, [place], CNS [phone]

## 2016-12-23 ENCOUNTER — Inpatient Hospital Stay (HOSPITAL_COMMUNITY): Payer: Self-pay

## 2016-12-23 DIAGNOSIS — J90 Pleural effusion, not elsewhere classified: Secondary | ICD-10-CM

## 2016-12-23 LAB — GLUCOSE, CAPILLARY
GLUCOSE-CAPILLARY: 196 mg/dL — AB (ref 65–99)
GLUCOSE-CAPILLARY: 126 mg/dL — AB (ref 65–99)
Glucose-Capillary: 229 mg/dL — ABNORMAL HIGH (ref 65–99)
Glucose-Capillary: 240 mg/dL — ABNORMAL HIGH (ref 65–99)
Glucose-Capillary: 252 mg/dL — ABNORMAL HIGH (ref 65–99)
Glucose-Capillary: 257 mg/dL — ABNORMAL HIGH (ref 65–99)

## 2016-12-23 LAB — CBC
HCT: 30 % — ABNORMAL LOW (ref 39.0–52.0)
HEMOGLOBIN: 9.8 g/dL — AB (ref 13.0–17.0)
MCH: 27 pg (ref 26.0–34.0)
MCHC: 32.7 g/dL (ref 30.0–36.0)
MCV: 82.6 fL (ref 78.0–100.0)
Platelets: 602 10*3/uL — ABNORMAL HIGH (ref 150–400)
RBC: 3.63 MIL/uL — AB (ref 4.22–5.81)
RDW: 14.2 % (ref 11.5–15.5)
WBC: 19.7 10*3/uL — ABNORMAL HIGH (ref 4.0–10.5)

## 2016-12-23 LAB — BASIC METABOLIC PANEL
ANION GAP: 10 (ref 5–15)
BUN: <5 mg/dL — ABNORMAL LOW (ref 6–20)
CHLORIDE: 91 mmol/L — AB (ref 101–111)
CO2: 26 mmol/L (ref 22–32)
Calcium: 8.3 mg/dL — ABNORMAL LOW (ref 8.9–10.3)
Creatinine, Ser: 0.51 mg/dL — ABNORMAL LOW (ref 0.61–1.24)
GFR calc Af Amer: >60 mL/min (ref >60)
GFR calc non Af Amer: >60 mL/min (ref >60)
GLUCOSE: 196 mg/dL — AB (ref 65–99)
Potassium: 3.9 mmol/L (ref 3.5–5.1)
Sodium: 127 mmol/L — ABNORMAL LOW (ref 135–145)

## 2016-12-23 LAB — PROTIME-INR
INR: 1.2
PROTHROMBIN TIME: 15.3 s — AB (ref 11.4–15.2)

## 2016-12-23 LAB — OSMOLALITY, URINE: OSMOLALITY UR: 506 mosm/kg (ref 300–900)

## 2016-12-23 LAB — CULTURE, BLOOD (ROUTINE X 2): SPECIAL REQUESTS: ADEQUATE

## 2016-12-23 LAB — SODIUM, URINE, RANDOM: SODIUM UR: 111 mmol/L

## 2016-12-23 LAB — OSMOLALITY: Osmolality: 267 mOsm/kg — ABNORMAL LOW (ref 275–295)

## 2016-12-23 MED ORDER — BISACODYL 10 MG RE SUPP
10.0000 mg | Freq: Every day | RECTAL | Status: DC | PRN
Start: 2016-12-23 — End: 2017-01-06
  Administered 2016-12-23: 10 mg via RECTAL
  Filled 2016-12-23: qty 1

## 2016-12-23 MED ORDER — ENOXAPARIN SODIUM 40 MG/0.4ML ~~LOC~~ SOLN
40.0000 mg | SUBCUTANEOUS | Status: DC
  Administered 2016-12-25 – 2017-01-05 (×12): 40 mg via SUBCUTANEOUS
  Filled 2016-12-23 (×12): qty 0.4

## 2016-12-23 MED ORDER — DOXYCYCLINE HYCLATE 100 MG PO TABS
100.0000 mg | ORAL_TABLET | Freq: Two times a day (BID) | ORAL | Status: DC
  Administered 2016-12-23 – 2017-01-06 (×29): 100 mg via ORAL
  Filled 2016-12-23 (×29): qty 1

## 2016-12-23 MED ORDER — INSULIN GLARGINE 100 UNIT/ML ~~LOC~~ SOLN
42.0000 [IU] | Freq: Every day | SUBCUTANEOUS | Status: DC
  Administered 2016-12-24 – 2016-12-27 (×4): 42 [IU] via SUBCUTANEOUS
  Filled 2016-12-23 (×5): qty 0.42

## 2016-12-23 MED ORDER — ENOXAPARIN SODIUM 40 MG/0.4ML ~~LOC~~ SOLN: 40.0000 mg | SUBCUTANEOUS | Status: DC

## 2016-12-23 MED ORDER — IOPAMIDOL (ISOVUE-300) INJECTION 61%
INTRAVENOUS | Status: AC
  Administered 2016-12-23: 75 mL
  Filled 2016-12-23: qty 75

## 2016-12-23 NOTE — Progress Notes (Addendum)
      [address]       Keith Riggs,[name]             [phone]      Subjective:  Continues to complain of back pain.  Wonders when they are going to do something about his spine.  I told patient after review of ID and Neurosurgery notes, that there is no plan for surgical intervention on his spine  Objective: Vital signs in last 24 hours: Temp:  [99.2 F (37.3 C)-100 F (37.8 C)] 99.2 F (37.3 C) (07/17 0449) Cardiac Rhythm: Sinus tachycardia (07/17 0700) Resp:  [18-24] 24 (07/17 0609) BP: (127)/(62) 127/62 (07/16 2000) SpO2:  [91 %-95 %] 91 % (07/17 0609)  Intake/Output from previous day: 07/16 0701 - 07/17 0700 In: 1080 [P.O.:1080] Out: 5980 [Urine:5900; Drains:80]  General appearance: alert, cooperative and no distress Heart: regular rate and rhythm Lungs: diminished breath sounds bibasilar Abdomen: soft, non-tender; bowel sounds normal; no masses,  no organomegaly Extremities: extremities normal, atraumatic, no cyanosis or edema Wound: clean and dry  Lab Results:  Recent Labs  12/22/16 0211 12/23/16 0401  WBC 25.6* 19.7*  HGB 10.9* 9.8*  HCT 33.6* 30.0*  PLT 610* 602*   BMET:  Recent Labs  12/22/16 0211 12/23/16 0401  NA 132* 127*  K 3.5 3.9  CL 94* 91*  CO2 28 26  GLUCOSE 193* 196*  BUN <5* <5*  CREATININE 0.50* 0.51*  CALCIUM 8.2* 8.3*    PT/INR: No results for input(s): LABPROT, INR in the last 72 hours. ABG No results found for: PHART, HCO3, TCO2, ACIDBASEDEF, O2SAT CBG (last 3)   Recent Labs  12/22/16 2013 12/23/16 0016 12/23/16 0447  GLUCAP 246* 252* 196*    Assessment/Plan:  1. Empyema- S/P CT guided chest tube placement, no air leak, 80 cc output yesterday, leave in place today.... CXR is improved, but anterior collection remains and Dr. Tyrone Riggs has recommended CT guided placement of second chest tube 2. ID- low grade temp, + leukocytosis, ABX per ID 3. Pain control- per primary 4. Dispo- patient stable,  continue chest tube, CT guided placement of chest tube for anterior collection indicated, plan per primary   LOS: 3 days    Keith Riggs, Keith Riggs 12/23/2016 For repeat ct of chest and placement of 2 ct if un drained fluid evident  I have seen and examined Keith Riggs and agree with the above assessment  and plan.  Delight OvensEdward B Thoma Paulsen MD Beeper [phone] Office [phone] 12/23/2016 12:35 PM

## 2016-12-23 NOTE — Progress Notes (Signed)
[facility]   Reason for visit: Follow up on disseminated MRSA infection  Interval History: improved breathing but going to get another chest tube.  Afebrile, no chills.  Continued pain.  Moving all extremities and walking.  No associated n/v/d.  Day 7 antibiotics Day 2 daptomycin Stopped vancomycin and ceftriaxone   Physical Exam: Constitutional:  Vitals:   12/23/16 0449 12/23/16 0609  BP:    Pulse:    Resp:  (!) 24  Temp: 99.2 F (37.3 C)    patient appears in mild  Distress with pain Eyes: anicteric Respiratory: Normal respiratory effort; CTA B Cardiovascular: RRR GI: soft, nt, nd  Review of Systems: Constitutional: negative for fevers, chills and fatigue Gastrointestinal: negative for diarrhea Integument/breast: negative for rash  Lab Results  Component Value Date   WBC 19.7 (H) 12/23/2016   HGB 9.8 (L) 12/23/2016   HCT 30.0 (L) 12/23/2016   MCV 82.6 12/23/2016   PLT 602 (H) 12/23/2016    Lab Results  Component Value Date   CREATININE 0.51 (L) 12/23/2016   BUN <5 (L) 12/23/2016   NA 127 (L) 12/23/2016   K 3.9 12/23/2016   CL 91 (L) 12/23/2016   CO2 26 12/23/2016   No results found for: ALT, AST, GGT, ALKPHOS   Microbiology: Recent Results (from the past 240 hour(s))  Blood culture (routine x 2)     Status: Abnormal (Preliminary result)   Collection Time: 12/20/16 12:01 AM  Result Value Ref Range Status   Specimen Description BLOOD RIGHT HAND  Final   Special Requests   Final    BOTTLES DRAWN AEROBIC AND ANAEROBIC Blood Culture adequate volume   Culture  Setup Time   Final    GRAM POSITIVE COCCI IN CLUSTERS ANAEROBIC BOTTLE ONLY CRITICAL RESULT CALLED TO, READ BACK BY AND VERIFIED WITH: K. COOK PHARMD, AT 0701 12/22/16 BY D.VANHOOK    Culture (A)  Final    STAPHYLOCOCCUS AUREUS SUSCEPTIBILITIES PERFORMED ON PREVIOUS CULTURE WITHIN THE LAST 5 DAYS.    Report Status PENDING  Incomplete  Blood culture (routine x 2)      Status: Abnormal   Collection Time: 12/20/16 12:20 AM  Result Value Ref Range Status   Specimen Description BLOOD LEFT ANTECUBITAL  Final   Special Requests   Final    BOTTLES DRAWN AEROBIC AND ANAEROBIC Blood Culture adequate volume   Culture  Setup Time   Final    GRAM POSITIVE COCCI IN CLUSTERS ANAEROBIC BOTTLE ONLY CRITICAL RESULT CALLED TO, READ BACK BY AND VERIFIED WITH: Patrick North, PHARMD 2111 12/21/2016 T. TYSOR    Culture METHICILLIN RESISTANT STAPHYLOCOCCUS AUREUS (A)  Final   Report Status 12/23/2016 FINAL  Final   Organism ID, Bacteria METHICILLIN RESISTANT STAPHYLOCOCCUS AUREUS  Final      Susceptibility   Methicillin resistant staphylococcus aureus - MIC*    CIPROFLOXACIN >=8 RESISTANT Resistant     ERYTHROMYCIN <=0.25 SENSITIVE Sensitive     GENTAMICIN <=0.5 SENSITIVE Sensitive     OXACILLIN >=4 RESISTANT Resistant     TETRACYCLINE <=1 SENSITIVE Sensitive     VANCOMYCIN <=0.5 SENSITIVE Sensitive     TRIMETH/SULFA <=10 SENSITIVE Sensitive     CLINDAMYCIN <=0.25 SENSITIVE Sensitive     RIFAMPIN <=0.5 SENSITIVE Sensitive     Inducible Clindamycin NEGATIVE Sensitive     * METHICILLIN RESISTANT STAPHYLOCOCCUS AUREUS  Blood Culture ID Panel (Reflexed)     Status: Abnormal   Collection Time: 12/20/16 12:20 AM  Result  Value Ref Range Status   Enterococcus species NOT DETECTED NOT DETECTED Final   Listeria monocytogenes NOT DETECTED NOT DETECTED Final   Staphylococcus species DETECTED (A) NOT DETECTED Final    Comment: CRITICAL RESULT CALLED TO, READ BACK BY AND VERIFIED WITH: Patrick North. BATCHELDER, PHARMD 2111 12/21/2016 T. TYSOR    Staphylococcus aureus DETECTED (A) NOT DETECTED Final    Comment: Methicillin (oxacillin)-resistant Staphylococcus aureus (MRSA). MRSA is predictably resistant to beta-lactam antibiotics (except ceftaroline). Preferred therapy is vancomycin unless clinically contraindicated. Patient requires contact precautions if  hospitalized. CRITICAL RESULT  CALLED TO, READ BACK BY AND VERIFIED WITH: Patrick North. BATCHELDER, PHARMD 2111 12/21/2016 T. TYSOR    Methicillin resistance DETECTED (A) NOT DETECTED Final    Comment: CRITICAL RESULT CALLED TO, READ BACK BY AND VERIFIED WITH: Patrick North. BATCHELDER, PHARMD 2111 12/21/2016 T. TYSOR    Streptococcus species NOT DETECTED NOT DETECTED Final   Streptococcus agalactiae NOT DETECTED NOT DETECTED Final   Streptococcus pneumoniae NOT DETECTED NOT DETECTED Final   Streptococcus pyogenes NOT DETECTED NOT DETECTED Final   Acinetobacter baumannii NOT DETECTED NOT DETECTED Final   Enterobacteriaceae species NOT DETECTED NOT DETECTED Final   Enterobacter cloacae complex NOT DETECTED NOT DETECTED Final   Escherichia coli NOT DETECTED NOT DETECTED Final   Klebsiella oxytoca NOT DETECTED NOT DETECTED Final   Klebsiella pneumoniae NOT DETECTED NOT DETECTED Final   Proteus species NOT DETECTED NOT DETECTED Final   Serratia marcescens NOT DETECTED NOT DETECTED Final   Haemophilus influenzae NOT DETECTED NOT DETECTED Final   Neisseria meningitidis NOT DETECTED NOT DETECTED Final   Pseudomonas aeruginosa NOT DETECTED NOT DETECTED Final   Candida albicans NOT DETECTED NOT DETECTED Final   Candida glabrata NOT DETECTED NOT DETECTED Final   Candida krusei NOT DETECTED NOT DETECTED Final   Candida parapsilosis NOT DETECTED NOT DETECTED Final   Candida tropicalis NOT DETECTED NOT DETECTED Final  Aerobic/Anaerobic Culture (surgical/deep wound)     Status: None (Preliminary result)   Collection Time: 12/20/16  4:34 PM  Result Value Ref Range Status   Specimen Description WOUND PLEURAL LEFT  Final   Special Requests Normal  Final   Gram Stain   Final    ABUNDANT WBC PRESENT, PREDOMINANTLY PMN MODERATE GRAM POSITIVE COCCI IN CLUSTERS    Culture   Final    ABUNDANT METHICILLIN RESISTANT STAPHYLOCOCCUS AUREUS NO ANAEROBES ISOLATED; CULTURE IN PROGRESS FOR 5 DAYS    Report Status PENDING  Incomplete   Organism ID, Bacteria  METHICILLIN RESISTANT STAPHYLOCOCCUS AUREUS  Final      Susceptibility   Methicillin resistant staphylococcus aureus - MIC*    CIPROFLOXACIN >=8 RESISTANT Resistant     ERYTHROMYCIN <=0.25 SENSITIVE Sensitive     GENTAMICIN <=0.5 SENSITIVE Sensitive     OXACILLIN >=4 RESISTANT Resistant     TETRACYCLINE <=1 SENSITIVE Sensitive     VANCOMYCIN 1 SENSITIVE Sensitive     TRIMETH/SULFA <=10 SENSITIVE Sensitive     CLINDAMYCIN <=0.25 SENSITIVE Sensitive     RIFAMPIN <=0.5 SENSITIVE Sensitive     Inducible Clindamycin NEGATIVE Sensitive     * ABUNDANT METHICILLIN RESISTANT STAPHYLOCOCCUS AUREUS  MRSA PCR Screening     Status: Abnormal   Collection Time: 12/21/16  7:59 AM  Result Value Ref Range Status   MRSA by PCR POSITIVE (A) NEGATIVE Final    Comment:        The GeneXpert MRSA Assay (FDA approved for NASAL specimens only), is one component of a comprehensive MRSA colonization surveillance program.  It is not intended to diagnose MRSA infection nor to guide or monitor treatment for MRSA infections. RESULT CALLED TO, READ BACK BY AND VERIFIED WITH: T.MAINIERL RN AT 1025 12/21/16 BY A.DAVIS     Impression/Plan:  1. MRSA bacteremia - I will repeat the blood cultures tomorrow.   Now on daptomycin since we could not get his trough levels up.    2. Lung nodules with empyema - on daptomycin for above so will add doxycycline for better lung penetration.  New chest tube needed per Dr. Tyrone Sage.   3.  Discitis with extensive epidural abscess - will need a prolonged course of daptomycin at discharge.  C

## 2016-12-23 NOTE — Progress Notes (Signed)
PROGRESS NOTE    Keith Riggs   WUJ:[id number]  DOB: [birth year]  DOA: 12/19/2016 PCP: System, Pcp Not In   Brief Narrative:  26 y/o with IDDM, past history of heroin abuse, cocaine abuse who get random drug tests 2 x a month. He is a transfer from [facility] ER. History obtained from Grandmother and father whom he lives with.  He was seen there for a right groin abscess on Wed which was I and D'd. He was seen again on Friday for a packing of the abscess and was found to have a left groin abscess with was I and D'd. He returned to there ER in the after noon because he thought his packing had fallen out. At that time, he complained of back pain and was sent to [facility] for and MRI. This showed thoracic epidural abscesses causing severe spinal cord compression, paraspinal myositis and abscesses, T6 osteo and b/l pleural effusions.   CT chest abd pelvis: 1. Multiple cavitary nodules in both lungs, compatible with septic emboli. 2. Moderate-sized multiloculated left pleural effusion. The multiple loculations are compatible with empyema formation. 3. Previously described epidural and left paraspinal abscesses. 4. Bilateral lung atelectasis, greater on the left. 5. Soft tissue gas in the inferior, medial left buttock region, compatible with the patient's known groin abscesses. 6. Evidence of bilateral pyelonephritis with a small right perinephric abscess or small flattened exophytic cyst. 7. Minimal mediastinal adenopathy, most likely reactive.    Subjective: Back pain is relatively controlled- still able to walk in the room. Eating well. Is constipated. No other complaints.   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   Osteomyelitis of thoracic spine   Acute pyelonephritis   Soft tissue abscess of inguinal region- bilaterally  - lactic  acid normal, Wbc 26, fever 100.3, tachycardia - Neurosurgery following- recommend conservative management- cont Neuro checks every 4 hrs- re-call NS if any neurological changes - culture from wound on 7/11 is showing Strep Algactiae - blood culture from [facility] with gram + cocci in clusters - he has been on Keflex at home - MRSA PCR + - pleural fluid>> MRSA - blood culture 7/14 >> MRSA -  ID consulted  - 2 D ECHO negative for vegetation- no need for TEE as he will need a prolonged course of Vanc anyway - 7/16- Vanc trough < 4,  I discussed this with ID requested Daptomycin which was started on 7/16  - 7/17- Doxy started for better lung penetration than Dapto - WBC counts - 26.5 >> 26.0>> 25.6>> 19.7    Left sided loculated effusion and septic pulmonary emboli - related to above  - strep pneumo neg - CT surgery consulted- not a VATS candidate at this time- recommended IR consult for pigtail catheter- IR consulted and drain placed on 7/14 - per CT surgery, he needs a second drain in left upper chest- IR consulted again  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- sodium not improving with Saline infusion - held NS on the morning that he was admitted - 5900 cc urine yesterday - nephrology asked to assist in management- fluid restrict and follow  Hypokalemia/ hypomagnesemia - cont to replace and follow    DM2 (diabetes mellitus, type 2) - cont Lantus and SSI- dose of Lantus increased 7/15- will increase again today    IVDU (intravenous drug user) - has used IV Cocaine and Heroin  in the past - states he has not used in 2-3 months - grandmother states he get random drug tests 2 x month - UDS shows Opiates which he is receiving int he hospital   Cigarette smoker - Nicotine patch    Normocytic anemia - anemia >> Low Iron levels and normal Iron binding, retic count normal- Ferritin high likely due to infection - consistent with AOCD  Morbid Obesity  Body mass index is 33.05 kg/m.   DVT  prophylaxis: Lovenox Code Status: Full code Family Communication: father and grandmother Disposition Plan: SDU Consultants:   ID  CT surgery  Neurosurgery  IR Procedures:   CT guided left chest tube Antimicrobials:  Anti-infectives    Start     Dose/Rate Route Frequency Ordered Stop   12/23/16 1200  DAPTOmycin (CUBICIN) 860 mg in sodium chloride 0.9 % IVPB     8 mg/kg  107.5 kg 234.4 mL/hr over 30 Minutes Intravenous Every 24 hours 12/22/16 1858     12/22/16 1230  DAPTOmycin (CUBICIN) 860 mg in sodium chloride 0.9 % IVPB     8 mg/kg  107.5 kg 234.4 mL/hr over 30 Minutes Intravenous Every 24 hours 12/22/16 1151 12/22/16 1337   12/21/16 2200  vancomycin (VANCOCIN) IVPB 1000 mg/200 mL premix  Status:  Discontinued     1,000 mg 200 mL/hr over 60 Minutes Intravenous Every 6 hours 12/21/16 2110 12/22/16 1850   12/21/16 2000  vancomycin (VANCOCIN) IVPB 1000 mg/200 mL premix  Status:  Discontinued     1,000 mg 200 mL/hr over 60 Minutes Intravenous Every 8 hours 12/21/16 1339 12/21/16 2109   12/20/16 1300  piperacillin-tazobactam (ZOSYN) IVPB 3.375 g  Status:  Discontinued     3.375 g 12.5 mL/hr over 240 Minutes Intravenous Every 8 hours 12/20/16 1109 12/20/16 1214   12/20/16 1300  cefTRIAXone (ROCEPHIN) 2 g in dextrose 5 % 50 mL IVPB  Status:  Discontinued     2 g 100 mL/hr over 30 Minutes Intravenous Every 24 hours 12/20/16 1214 12/22/16 2001   12/20/16 0200  vancomycin (VANCOCIN) IVPB 1000 mg/200 mL premix  Status:  Discontinued     1,000 mg 200 mL/hr over 60 Minutes Intravenous Every 8 hours 12/20/16 0024 12/21/16 1339   12/20/16 0200  piperacillin-tazobactam (ZOSYN) IVPB 3.375 g  Status:  Discontinued     3.375 g 12.5 mL/hr over 240 Minutes Intravenous Every 8 hours 12/20/16 0024 12/20/16 1109       Objective: Vitals:   12/22/16 2224 12/23/16 0226 12/23/16 0449 12/23/16 0609  BP:      Pulse:      Resp: (!) 23   (!) 24  Temp:   99.2 F (37.3 C)   TempSrc:   Oral     SpO2: 93% 95%  91%  Weight:      Height:        Intake/Output Summary (Last 24 hours) at 12/23/16 1148 Last data filed at 12/23/16 0957  Gross per 24 hour  Intake              840 ml  Output             5980 ml  Net            -5140 ml   Filed Weights   12/19/16 2324  Weight: 107.5 kg (237 lb)    Examination: General exam: Appears uncomfortable  HEENT: PERRLA, oral mucosa moist, no sclera icterus or thrush Respiratory system: Clear to auscultation. RR in mid  20s on my exam. - CT drain with yellow fluid-  crackles in left lower lung field Cardiovascular system: S1 & S2 heard, RRR.  No murmurs  Gastrointestinal system: Abdomen soft, non-tender, nondistended. Normal bowel sound. No organomegaly Central nervous system: Alert and oriented. No focal neurological deficits. Extremities: No cyanosis, clubbing or edema Skin: No rashes or ulcers Psychiatry:  Mood & affect appropriate.     Data Reviewed: I have personally reviewed following labs and imaging studies  CBC:  Recent Labs Lab 12/20/16 0011 12/21/16 0242 12/22/16 0211 12/23/16 0401  WBC 26.5* 26.0* 25.6* 19.7*  HGB 10.3* 10.7* 10.9* 9.8*  HCT 31.6* 32.9* 33.6* 30.0*  MCV 82.3 83.9 83.0 82.6  PLT 529* 544* 610* 602*   Basic Metabolic Panel:  Recent Labs Lab 12/20/16 1939 12/21/16 0242 12/21/16 0840 12/21/16 1847 12/22/16 0211 12/23/16 0401  NA 127* 127*  --  124* 132* 127*  K 3.2* 3.0*  --  3.6 3.5 3.9  CL 93* 93*  --  90* 94* 91*  CO2 25 27  --  26 28 26   GLUCOSE 250* 220*  --  260* 193* 196*  BUN <5* <5*  --  <5* <5* <5*  CREATININE 0.55* 0.49*  --  0.60* 0.50* 0.51*  CALCIUM 7.8* 7.9*  --  7.9* 8.2* 8.3*  MG  --   --  1.6*  --   --   --    GFR: Estimated Creatinine Clearance: 171.5 mL/min (A) (by C-G formula based on SCr of 0.51 mg/dL (L)). Liver Function Tests:  Recent Labs Lab 12/20/16 0909  PROT 5.9*   No results for input(s): LIPASE, AMYLASE in the last 168 hours. No results for  input(s): AMMONIA in the last 168 hours. Coagulation Profile:  Recent Labs Lab 12/23/16 0755  INR 1.20   Cardiac Enzymes:  Recent Labs Lab 12/22/16 1212  CKTOTAL 20*   BNP (last 3 results) No results for input(s): PROBNP in the last 8760 hours. HbA1C: No results for input(s): HGBA1C in the last 72 hours. CBG:  Recent Labs Lab 12/22/16 2013 12/23/16 0016 12/23/16 0447 12/23/16 0748 12/23/16 1114  GLUCAP 246* 252* 196* 229* 257*   Lipid Profile: No results for input(s): CHOL, HDL, LDLCALC, TRIG, CHOLHDL, LDLDIRECT in the last 72 hours. Thyroid Function Tests: No results for input(s): TSH, T4TOTAL, FREET4, T3FREE, THYROIDAB in the last 72 hours. Anemia Panel:  Recent Labs  12/20/16 1939 12/21/16 0242  VITAMINB12  --  397  FOLATE  --  9.8  FERRITIN 1,249*  --   TIBC 132*  --   IRON 17*  --   RETICCTPCT 0.7  --    Urine analysis: No results found for: COLORURINE, APPEARANCEUR, LABSPEC, PHURINE, GLUCOSEU, HGBUR, BILIRUBINUR, KETONESUR, PROTEINUR, UROBILINOGEN, NITRITE, LEUKOCYTESUR Sepsis Labs: @LABRCNTIP (procalcitonin:4,lacticidven:4) ) Recent Results (from the past 240 hour(s))  Blood culture (routine x 2)     Status: Abnormal (Preliminary result)   Collection Time: 12/20/16 12:01 AM  Result Value Ref Range Status   Specimen Description BLOOD RIGHT HAND  Final   Special Requests   Final    BOTTLES DRAWN AEROBIC AND ANAEROBIC Blood Culture adequate volume   Culture  Setup Time   Final    GRAM POSITIVE COCCI IN CLUSTERS ANAEROBIC BOTTLE ONLY CRITICAL RESULT CALLED TO, READ BACK BY AND VERIFIED WITH: K. COOK PHARMD, AT 0701 12/22/16 BY D.VANHOOK    Culture (A)  Final    STAPHYLOCOCCUS AUREUS SUSCEPTIBILITIES PERFORMED ON PREVIOUS CULTURE WITHIN THE LAST 5 DAYS.  Report Status PENDING  Incomplete  Blood culture (routine x 2)     Status: Abnormal   Collection Time: 12/20/16 12:20 AM  Result Value Ref Range Status   Specimen Description BLOOD LEFT  ANTECUBITAL  Final   Special Requests   Final    BOTTLES DRAWN AEROBIC AND ANAEROBIC Blood Culture adequate volume   Culture  Setup Time   Final    GRAM POSITIVE COCCI IN CLUSTERS ANAEROBIC BOTTLE ONLY CRITICAL RESULT CALLED TO, READ BACK BY AND VERIFIED WITH: Patrick North, PHARMD 2111 12/21/2016 T. TYSOR    Culture METHICILLIN RESISTANT STAPHYLOCOCCUS AUREUS (A)  Final   Report Status 12/23/2016 FINAL  Final   Organism ID, Bacteria METHICILLIN RESISTANT STAPHYLOCOCCUS AUREUS  Final      Susceptibility   Methicillin resistant staphylococcus aureus - MIC*    CIPROFLOXACIN >=8 RESISTANT Resistant     ERYTHROMYCIN <=0.25 SENSITIVE Sensitive     GENTAMICIN <=0.5 SENSITIVE Sensitive     OXACILLIN >=4 RESISTANT Resistant     TETRACYCLINE <=1 SENSITIVE Sensitive     VANCOMYCIN <=0.5 SENSITIVE Sensitive     TRIMETH/SULFA <=10 SENSITIVE Sensitive     CLINDAMYCIN <=0.25 SENSITIVE Sensitive     RIFAMPIN <=0.5 SENSITIVE Sensitive     Inducible Clindamycin NEGATIVE Sensitive     * METHICILLIN RESISTANT STAPHYLOCOCCUS AUREUS  Blood Culture ID Panel (Reflexed)     Status: Abnormal   Collection Time: 12/20/16 12:20 AM  Result Value Ref Range Status   Enterococcus species NOT DETECTED NOT DETECTED Final   Listeria monocytogenes NOT DETECTED NOT DETECTED Final   Staphylococcus species DETECTED (A) NOT DETECTED Final    Comment: CRITICAL RESULT CALLED TO, READ BACK BY AND VERIFIED WITH: Patrick North, PHARMD 2111 12/21/2016 T. TYSOR    Staphylococcus aureus DETECTED (A) NOT DETECTED Final    Comment: Methicillin (oxacillin)-resistant Staphylococcus aureus (MRSA). MRSA is predictably resistant to beta-lactam antibiotics (except ceftaroline). Preferred therapy is vancomycin unless clinically contraindicated. Patient requires contact precautions if  hospitalized. CRITICAL RESULT CALLED TO, READ BACK BY AND VERIFIED WITH: Patrick North, PHARMD 2111 12/21/2016 T. TYSOR    Methicillin resistance  DETECTED (A) NOT DETECTED Final    Comment: CRITICAL RESULT CALLED TO, READ BACK BY AND VERIFIED WITH: Patrick North, PHARMD 2111 12/21/2016 T. TYSOR    Streptococcus species NOT DETECTED NOT DETECTED Final   Streptococcus agalactiae NOT DETECTED NOT DETECTED Final   Streptococcus pneumoniae NOT DETECTED NOT DETECTED Final   Streptococcus pyogenes NOT DETECTED NOT DETECTED Final   Acinetobacter baumannii NOT DETECTED NOT DETECTED Final   Enterobacteriaceae species NOT DETECTED NOT DETECTED Final   Enterobacter cloacae complex NOT DETECTED NOT DETECTED Final   Escherichia coli NOT DETECTED NOT DETECTED Final   Klebsiella oxytoca NOT DETECTED NOT DETECTED Final   Klebsiella pneumoniae NOT DETECTED NOT DETECTED Final   Proteus species NOT DETECTED NOT DETECTED Final   Serratia marcescens NOT DETECTED NOT DETECTED Final   Haemophilus influenzae NOT DETECTED NOT DETECTED Final   Neisseria meningitidis NOT DETECTED NOT DETECTED Final   Pseudomonas aeruginosa NOT DETECTED NOT DETECTED Final   Candida albicans NOT DETECTED NOT DETECTED Final   Candida glabrata NOT DETECTED NOT DETECTED Final   Candida krusei NOT DETECTED NOT DETECTED Final   Candida parapsilosis NOT DETECTED NOT DETECTED Final   Candida tropicalis NOT DETECTED NOT DETECTED Final  Aerobic/Anaerobic Culture (surgical/deep wound)     Status: None (Preliminary result)   Collection Time: 12/20/16  4:34 PM  Result Value Ref Range  Status   Specimen Description WOUND PLEURAL LEFT  Final   Special Requests Normal  Final   Gram Stain   Final    ABUNDANT WBC PRESENT, PREDOMINANTLY PMN MODERATE GRAM POSITIVE COCCI IN CLUSTERS    Culture   Final    ABUNDANT METHICILLIN RESISTANT STAPHYLOCOCCUS AUREUS NO ANAEROBES ISOLATED; CULTURE IN PROGRESS FOR 5 DAYS    Report Status PENDING  Incomplete   Organism ID, Bacteria METHICILLIN RESISTANT STAPHYLOCOCCUS AUREUS  Final      Susceptibility   Methicillin resistant staphylococcus aureus -  MIC*    CIPROFLOXACIN >=8 RESISTANT Resistant     ERYTHROMYCIN <=0.25 SENSITIVE Sensitive     GENTAMICIN <=0.5 SENSITIVE Sensitive     OXACILLIN >=4 RESISTANT Resistant     TETRACYCLINE <=1 SENSITIVE Sensitive     VANCOMYCIN 1 SENSITIVE Sensitive     TRIMETH/SULFA <=10 SENSITIVE Sensitive     CLINDAMYCIN <=0.25 SENSITIVE Sensitive     RIFAMPIN <=0.5 SENSITIVE Sensitive     Inducible Clindamycin NEGATIVE Sensitive     * ABUNDANT METHICILLIN RESISTANT STAPHYLOCOCCUS AUREUS  MRSA PCR Screening     Status: Abnormal   Collection Time: 12/21/16  7:59 AM  Result Value Ref Range Status   MRSA by PCR POSITIVE (A) NEGATIVE Final    Comment:        The GeneXpert MRSA Assay (FDA approved for NASAL specimens only), is one component of a comprehensive MRSA colonization surveillance program. It is not intended to diagnose MRSA infection nor to guide or monitor treatment for MRSA infections. RESULT CALLED TO, READ BACK BY AND VERIFIED WITH: T.MAINIERL RN AT 1025 12/21/16 BY A.DAVIS          Radiology Studies: Dg Chest Port 1 View  Result Date: 12/23/2016 CLINICAL DATA:  Left-sided chest tube . EXAM: PORTABLE CHEST 1 VIEW COMPARISON:  12/22/2016. FINDINGS: Mediastinum is stable. Left chest tube in stable position. No focal infiltrate. Loculated left-sided pleural effusion again noted without change from prior exam. No pneumothorax. Stable cardiomegaly. IMPRESSION: 1. Left-sided chest tube is in stable position. Stable loculated left pleural effusion. Chest is unchanged from prior exam. No pneumothorax. 2. Stable cardiomegaly . Electronically Signed   By: Maisie Fus  Register   On: 12/23/2016 07:24   Dg Chest Port 1 View  Result Date: 12/22/2016 CLINICAL DATA:  Chest tube. EXAM: PORTABLE CHEST 1 VIEW COMPARISON:  12/21/2016 .  CT 12/20/2016. FINDINGS: Left chest tube in stable position. Loculated left apical pleural effusion improved from prior exam. Mild bibasilar subsegmental atelectasis. Stable  cardiomegaly. IMPRESSION: 1. Left chest tube in stable position. Loculated left sided pleural effusion again noted, interim improvement from prior exam. No pneumothorax. 2.  Stable cardiomegaly. Electronically Signed   By: Maisie Fus  Register   On: 12/22/2016 07:23      Scheduled Meds: . Chlorhexidine Gluconate Cloth  6 each Topical Q0600  . [START ON 12/24/2016] enoxaparin (LOVENOX) injection  40 mg Subcutaneous Q24H  . insulin aspart  0-15 Units Subcutaneous Q4H  . insulin glargine  36 Units Subcutaneous Daily  . mupirocin ointment  1 application Nasal BID  . nicotine  21 mg Transdermal Daily  . oxyCODONE  5 mg Oral 6 X Daily  . senna-docusate  2 tablet Oral Daily   Continuous Infusions: . DAPTOmycin (CUBICIN)  IV       LOS: 3 days    Time spent in minutes: 60 min at minimal in intervals from 7:30 AM until now 5: 25 PM - greater than 50 %  of time spent speaking with patient, father grandmother, multiple nurses, the physician who admitting him, reviewing chart from [facility], calling consults and following up imaging and labs.     Calvert Cantor, MD [facility] Pager: [email or web address] Password [facility] 12/23/2016, 11:48 AM

## 2016-12-23 NOTE — Progress Notes (Signed)
Patient ID: Keith Riggs, male   DOB: 11/30/1990, 26 y.o.   MRN: [id number] Chest xray reviewed this am, recommend ct directed upper chest chest tube on left if ct shows persistence  of anterior r loculated fluid  Delight OvensEdward B Remon Quinto MD      [address] [place] [id number] Office [phone]   Beeper [phone]

## 2016-12-23 NOTE — Progress Notes (Signed)
Repeat CT today shows persistent loculated undrained fluid collections. D/W CT surgery. Plan for additional drain placement today. Procedure reviewed, new consent obtained.  Brayton ElKevin Diyari Cherne PA-C Interventional Radiology 12/23/2016 4:44 PM

## 2016-12-23 NOTE — Consult Note (Signed)
Keith Riggs Admit Date: 12/19/2016 12/23/2016 [place] Requesting Physician:  Dr. Butler Denmark  Reason for Consult:  hyponatremia HPI:  Keith Riggs is a 26yo male with PMH of IDDM and IVDU who was admitted to [facility] with sepsis; he was found to have MRSA bacteremia 2/2 groin abscesses with multiple epidural and paraspinal abscesses, bilateral pleural emyema, pulmonary septic emboli, bilateral pyelonephritis. He has had consistently midly low sodium since admission and nephrology was consulted for recommendations in management of the low sodium.  PMH Incudes:  IDDM  H/o IVDU   Creatinine, Ser (mg/dL)  Date Value  16/03/9603 0.51 (L)  12/22/2016 0.50 (L)  12/21/2016 0.60 (L)  12/21/2016 0.49 (L)  12/20/2016 0.55 (L)  12/20/2016 0.64  12/20/2016 0.63  12/20/2016 0.56 (L)   I/Os: -6L in last 24hrs; -10.2L since admission.  ROS Patient states he has been drinking a lot of fluids during this admission to keep hydrated though notes only about one pitcher of water and drinks with meals. He denies abdominal pain, nausea, vomiting, headaches, vision changes, dizziness. He states his appetite has been good and he is finishing all of his meals.  Balance of 12 systems is negative w/ exceptions as above  PMH  Past Medical History:  Diagnosis Date  . Abscess   . Diabetes mellitus without complication (HCC)    PSH  Past Surgical History:  Procedure Laterality Date  . DENTAL SURGERY    . TONSILLECTOMY     FH History reviewed. No pertinent family history. SH  reports that he has been smoking Cigarettes.  He does not have any smokeless tobacco history on file. He reports that he drinks alcohol. He reports that he uses drugs, including IV. Allergies  Allergies  Allergen Reactions  . Bee Venom Anaphylaxis  . Voltaren [Diclofenac Sodium] Other (See Comments)    "Makes me feel not myself"  . Latex Rash   Home medications Prior to Admission medications   Medication Sig Start Date End  Date Taking? Authorizing Provider  cephALEXin (KEFLEX) 500 MG capsule Take 500 mg by mouth 4 (four) times daily. 12/15/16  Yes [provider]  HYDROcodone-acetaminophen (NORCO/VICODIN) 5-325 MG tablet Take 1 tablet by mouth every 4 (four) hours as needed. for pain 12/17/16  Yes [provider]  Insulin Glargine (TOUJEO MAX SOLOSTAR) 300 UNIT/ML SOPN Inject 40 Units into the skin daily.   Yes [provider]  metFORMIN (GLUCOPHAGE) 500 MG tablet Take 500 mg by mouth 2 (two) times daily with a meal.   Yes [provider]    Current Medications Scheduled Meds: . Chlorhexidine Gluconate Cloth  6 each Topical Q0600  . [START ON 12/24/2016] enoxaparin (LOVENOX) injection  40 mg Subcutaneous Q24H  . insulin aspart  0-15 Units Subcutaneous Q4H  . insulin glargine  36 Units Subcutaneous Daily  . mupirocin ointment  1 application Nasal BID  . nicotine  21 mg Transdermal Daily  . oxyCODONE  5 mg Oral 6 X Daily  . senna-docusate  2 tablet Oral Daily   Continuous Infusions: . DAPTOmycin (CUBICIN)  IV     PRN Meds:.acetaminophen, HYDROmorphone (DILAUDID) injection, ondansetron (ZOFRAN) IV  CBC  Recent Labs Lab 12/21/16 0242 12/22/16 0211 12/23/16 0401  WBC 26.0* 25.6* 19.7*  HGB 10.7* 10.9* 9.8*  HCT 32.9* 33.6* 30.0*  MCV 83.9 83.0 82.6  PLT 544* 610* 602*   Basic Metabolic Panel  Recent Labs Lab 12/20/16 0043 12/20/16 5409 12/20/16 1939 12/21/16 0242 12/21/16 1847 12/22/16 0211 12/23/16 0401  NA 126* 127* 127* 127* 124* 132* 127*  K 3.5 3.6 3.2* 3.0* 3.6 3.5 3.9  CL 95* 95* 93* 93* 90* 94* 91*  CO2 21* 20* 25 27 26 28 26   GLUCOSE 191* 195* 250* 220* 260* 193* 196*  BUN 8 5* <5* <5* <5* <5* <5*  CREATININE 0.63 0.64 0.55* 0.49* 0.60* 0.50* 0.51*  CALCIUM 7.8* 7.7* 7.8* 7.9* 7.9* 8.2* 8.3*   Urine osmolality 12/22/2016: 506 Urine Sodium 12/22/2016: 111  Physical Exam  Blood pressure 127/62, pulse (!) 116, temperature 99.2 F (37.3 C),  temperature source Oral, resp. rate (!) 24, height 5\' 11"  (1.803 m), weight 237 lb (107.5 kg), SpO2 91 %. GEN: NAD, sleepy but will wake to answer questions ENT: moist mucous membranes EYES: anicteric, PERL bil CV: RRR, no murmurs, rubs or gallops, pulses intact, no LE edema PULM: mildly course breath sounds throughout an anterior chest ABD: soft, NDNT, +BS SKIN: several tattoos; multiple well healed scars on extremities; no rashes EXT: moves all extremities freely  Assessment Keith Riggs is a 26yo male with disseminated MRSA and consistently mildly low sodium since admission from which he is asymptomatic. He appears euvolemic and has stable vitals; there is no serum osmolality available but previous urine studies from a couple of days ago and repeat from yesterday show high urine osmolality and high urine sodium. 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   1. 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 Disseminated MRSA infection - has one chest tube, scheduled for second later today for second site of empyema  Plan 1. Follow daily Bmets, f/u serum osmolality 2. After procedure and diet re-started, please add on fluid restriction to 1.5L; if unresponsive in next day or 2 to only fluid restriction, can consider diuretics in setting of urine osm >400. 3. Rest per primary and consultants  Nyra MarketGorica Lelani Garnett, MD PGY2 12/23/2016, 9:39 AM

## 2016-12-24 ENCOUNTER — Encounter (HOSPITAL_COMMUNITY): Payer: Self-pay

## 2016-12-24 ENCOUNTER — Inpatient Hospital Stay (HOSPITAL_COMMUNITY): Payer: Self-pay

## 2016-12-24 LAB — GLUCOSE, CAPILLARY
GLUCOSE-CAPILLARY: 199 mg/dL — AB (ref 65–99)
GLUCOSE-CAPILLARY: 130 mg/dL — AB (ref 65–99)
GLUCOSE-CAPILLARY: 240 mg/dL — AB (ref 65–99)
Glucose-Capillary: 165 mg/dL — ABNORMAL HIGH (ref 65–99)
Glucose-Capillary: 165 mg/dL — ABNORMAL HIGH (ref 65–99)
Glucose-Capillary: 230 mg/dL — ABNORMAL HIGH (ref 65–99)
Glucose-Capillary: 285 mg/dL — ABNORMAL HIGH (ref 65–99)

## 2016-12-24 LAB — BASIC METABOLIC PANEL
Anion gap: 7 (ref 5–15)
CHLORIDE: 90 mmol/L — AB (ref 101–111)
CO2: 29 mmol/L (ref 22–32)
CREATININE: 0.42 mg/dL — AB (ref 0.61–1.24)
Calcium: 8.5 mg/dL — ABNORMAL LOW (ref 8.9–10.3)
GFR calc Af Amer: >60 mL/min (ref >60)
GFR calc non Af Amer: >60 mL/min (ref >60)
Glucose, Bld: 156 mg/dL — ABNORMAL HIGH (ref 65–99)
Potassium: 4 mmol/L (ref 3.5–5.1)
Sodium: 126 mmol/L — ABNORMAL LOW (ref 135–145)

## 2016-12-24 LAB — CULTURE, BLOOD (ROUTINE X 2): Special Requests: ADEQUATE

## 2016-12-24 LAB — CBC
HCT: 30.3 % — ABNORMAL LOW (ref 39.0–52.0)
Hemoglobin: 9.8 g/dL — ABNORMAL LOW (ref 13.0–17.0)
MCH: 26.7 pg (ref 26.0–34.0)
MCHC: 32.3 g/dL (ref 30.0–36.0)
MCV: 82.6 fL (ref 78.0–100.0)
PLATELETS: 619 10*3/uL — AB (ref 150–400)
RBC: 3.67 MIL/uL — ABNORMAL LOW (ref 4.22–5.81)
RDW: 14.2 % (ref 11.5–15.5)
WBC: 20.1 10*3/uL — ABNORMAL HIGH (ref 4.0–10.5)

## 2016-12-24 LAB — OSMOLALITY, URINE: OSMOLALITY UR: 487 mosm/kg (ref 300–900)

## 2016-12-24 MED ORDER — POLYETHYLENE GLYCOL 3350 17 G PO PACK
17.0000 g | PACK | Freq: Every day | ORAL | Status: DC
  Administered 2016-12-26 – 2016-12-28 (×3): 17 g via ORAL
  Filled 2016-12-24 (×9): qty 1

## 2016-12-24 MED ORDER — FENTANYL CITRATE (PF) 100 MCG/2ML IJ SOLN
INTRAMUSCULAR | Status: AC
  Filled 2016-12-24: qty 4

## 2016-12-24 MED ORDER — MIDAZOLAM HCL 2 MG/2ML IJ SOLN
INTRAMUSCULAR | Status: AC
  Filled 2016-12-24: qty 4

## 2016-12-24 MED ORDER — MIDAZOLAM HCL 2 MG/2ML IJ SOLN
INTRAMUSCULAR | Status: AC | PRN
  Administered 2016-12-24 (×2): 1 mg via INTRAVENOUS

## 2016-12-24 MED ORDER — LIDOCAINE HCL (PF) 1 % IJ SOLN
INTRAMUSCULAR | Status: AC
  Filled 2016-12-24: qty 30

## 2016-12-24 MED ORDER — SENNOSIDES-DOCUSATE SODIUM 8.6-50 MG PO TABS
2.0000 | ORAL_TABLET | Freq: Two times a day (BID) | ORAL | Status: DC
  Administered 2016-12-26 – 2017-01-04 (×8): 2 via ORAL
  Filled 2016-12-24 (×15): qty 2

## 2016-12-24 MED ORDER — FENTANYL CITRATE (PF) 100 MCG/2ML IJ SOLN
INTRAMUSCULAR | Status: AC | PRN
  Administered 2016-12-24 (×2): 50 ug via INTRAVENOUS

## 2016-12-24 NOTE — Sedation Documentation (Signed)
Patient is resting comfortably. 

## 2016-12-24 NOTE — Progress Notes (Addendum)
      [address]       [name] [id number]             [phone]     Subjective:  [name] has no new complaints.  He continues to have back pain when he lays flat.  They did not place his chest tube yesterday and he is NPO to have this done today.    Objective: Vital signs in last 24 hours: Temp:  [98.9 F (37.2 C)-100.3 F (37.9 C)] 98.9 F (37.2 C) (07/18 0508) Pulse Rate:  [103-111] 103 (07/18 0508) Cardiac Rhythm: Sinus tachycardia (07/18 0700) Resp:  [18-27] 27 (07/18 0800) BP: (117-122)/(66-76) 119/66 (07/18 0800) SpO2:  [89 %-94 %] 94 % (07/18 0800)  Intake/Output from previous day: 07/17 0701 - 07/18 0700 In: 117.2 [IV Piggyback:117.2] Out: 2236 [Urine:2205; Drains:30; Stool:1] Intake/Output this shift: Total I/O In: -  Out: 800 [Urine:800]  General appearance: alert, cooperative and no distress Heart: regular rate and rhythm Lungs: diminished breath sounds bibasilar Abdomen: soft, non-tender; bowel sounds normal; no masses,  no organomegaly Extremities: extremities normal, atraumatic, no cyanosis or edema Wound: clean and dry  Lab Results:  Recent Labs  12/23/16 0401 12/24/16 0634  WBC 19.7* 20.1*  HGB 9.8* 9.8*  HCT 30.0* 30.3*  PLT 602* 619*   BMET:  Recent Labs  12/23/16 0401 12/24/16 0634  NA 127* 126*  K 3.9 4.0  CL 91* 90*  CO2 26 29  GLUCOSE 196* 156*  BUN <5* <5*  CREATININE 0.51* 0.42*  CALCIUM 8.3* 8.5*    PT/INR:  Recent Labs  12/23/16 0755  LABPROT 15.3*  INR 1.20   ABG No results found for: PHART, HCO3, TCO2, ACIDBASEDEF, O2SAT CBG (last 3)   Recent Labs  12/24/16 0033 12/24/16 0356 12/24/16 0847  GLUCAP 230* 165* 165*    Assessment/Plan:  1. Chest tube- 30 cc output yesterday, no air leak, may be able to remove soon 2. Pulm- superior fluid pocket identified with CT scan yesterday, for IR guided CT scan chest tube placement today... As patient is currently not a surgical candidate 3.  ID- some fevers yesterday, ABX per ID 4. Dispo- patient stable, for chest tube placement today by IR, leave chest tube in place for now, care per primary   LOS: 4 days    BARRETT, ERIN 12/24/2016  2nd chest tube placed , monitor output  Follow up chest xray in am I have seen and examined Lillia MountainKameron Dragon and agree with the above assessment  and plan.  Delight OvensEdward B Jonmarc Bodkin MD [facility] [phone] Office [phone] 12/24/2016 6:29 PM

## 2016-12-24 NOTE — Progress Notes (Signed)
PROGRESS NOTE    Keith Riggs   ZOX:[id number]  DOB: 1991-04-15  DOA: 12/19/2016 PCP: System, Pcp Not In   Brief Narrative:  26 y/o with IDDM, past history of heroin abuse, cocaine abuse who get random drug tests 2 x a month. He is a transfer from [facility] ER. History obtained from Grandmother and father whom he lives with.  He was seen there for a right groin abscess on Wed which was I and D'd. He was seen again on Friday for a packing of the abscess and was found to have a left groin abscess with was I and D'd. He returned to there ER in the after noon because he thought his packing had fallen out. At that time, he complained of back pain and was sent to [facility] for and MRI. This showed thoracic epidural abscesses causing severe spinal cord compression, paraspinal myositis and abscesses, T6 osteo and b/l pleural effusions.   CT chest abd pelvis: 1. Multiple cavitary nodules in both lungs, compatible with septic emboli. 2. Moderate-sized multiloculated left pleural effusion. The multiple loculations are compatible with empyema formation. 3. Previously described epidural and left paraspinal abscesses. 4. Bilateral lung atelectasis, greater on the left. 5. Soft tissue gas in the inferior, medial left buttock region, compatible with the patient's known groin abscesses. 6. Evidence of bilateral pyelonephritis with a small right perinephric abscess or small flattened exophytic cyst. 7. Minimal mediastinal adenopathy, most likely reactive.    Subjective: Complained about constipation. No nausea no vomiting. Pain is not well controlled.  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   Osteomyelitis of thoracic spine   Acute pyelonephritis   Soft tissue abscess of inguinal region- bilaterally  - lactic acid normal, Wbc 26, fever 100.3,  tachycardia - Neurosurgery following- recommend conservative management- cont Neuro checks every 4 hrs- re-call NS if any neurological changes - culture from wound on 7/11 is showing Strep Algactiae - blood culture from [facility] with gram + cocci in clusters - he has been on Keflex at home - MRSA PCR + - pleural fluid>> MRSA - blood culture 7/14 >> MRSA -  ID consulted  - 2 D ECHO negative for vegetation- no need for TEE as he will need a prolonged course of Vanc anyway - 7/16- Vanc trough < 4,  I discussed this with ID requested Daptomycin which was started on 7/16  - 7/17- Doxy started for better lung penetration than Dapto - WBC counts - trending down    Left sided loculated effusion and septic pulmonary emboli - related to above  - strep pneumo neg - CT surgery consulted- not a VATS candidate at this time- recommended IR consult for pigtail catheter- IR consulted and drain placed on 7/14 - per CT surgery, he needs a second drain in left upper chest- IR consulted again, tolerated procedure well today.  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- sodium not improving with Saline infusion - held NS on the morning that he was admitted - 5900 cc urine yesterday - nephrology asked to assist in management- fluid restrict and follow  Hypokalemia/ hypomagnesemia - cont to replace and follow    DM2 (diabetes mellitus, type 2) - cont Lantus and SSI- dose of Lantus increased 7/15- will increase again today    IVDU (intravenous drug user) - has used IV Cocaine and Heroin in the past - states he has  not used in 2-3 months - grandmother states he get random drug tests 2 x month - UDS shows Opiates which he is receiving int he hospital   Cigarette smoker - Nicotine patch    Normocytic anemia - anemia >> Low Iron levels and normal Iron binding, retic count normal- Ferritin high likely due to infection - consistent with AOCD  Morbid Obesity  Body mass index is 33.05 kg/m.   DVT prophylaxis:  Lovenox Code Status: Full code Family Communication: No family at bedside Disposition Plan: SDU Consultants:   ID  CT surgery  Neurosurgery  IR Procedures:   CT guided left chest tube Antimicrobials:  Anti-infectives    Start     Dose/Rate Route Frequency Ordered Stop   12/23/16 1230  doxycycline (VIBRA-TABS) tablet 100 mg     100 mg Oral Every 12 hours 12/23/16 1203     12/23/16 1200  DAPTOmycin (CUBICIN) 860 mg in sodium chloride 0.9 % IVPB     8 mg/kg  107.5 kg 234.4 mL/hr over 30 Minutes Intravenous Every 24 hours 12/22/16 1858     12/22/16 1230  DAPTOmycin (CUBICIN) 860 mg in sodium chloride 0.9 % IVPB     8 mg/kg  107.5 kg 234.4 mL/hr over 30 Minutes Intravenous Every 24 hours 12/22/16 1151 12/22/16 1337   12/21/16 2200  vancomycin (VANCOCIN) IVPB 1000 mg/200 mL premix  Status:  Discontinued     1,000 mg 200 mL/hr over 60 Minutes Intravenous Every 6 hours 12/21/16 2110 12/22/16 1850   12/21/16 2000  vancomycin (VANCOCIN) IVPB 1000 mg/200 mL premix  Status:  Discontinued     1,000 mg 200 mL/hr over 60 Minutes Intravenous Every 8 hours 12/21/16 1339 12/21/16 2109   12/20/16 1300  piperacillin-tazobactam (ZOSYN) IVPB 3.375 g  Status:  Discontinued     3.375 g 12.5 mL/hr over 240 Minutes Intravenous Every 8 hours 12/20/16 1109 12/20/16 1214   12/20/16 1300  cefTRIAXone (ROCEPHIN) 2 g in dextrose 5 % 50 mL IVPB  Status:  Discontinued     2 g 100 mL/hr over 30 Minutes Intravenous Every 24 hours 12/20/16 1214 12/22/16 2001   12/20/16 0200  vancomycin (VANCOCIN) IVPB 1000 mg/200 mL premix  Status:  Discontinued     1,000 mg 200 mL/hr over 60 Minutes Intravenous Every 8 hours 12/20/16 0024 12/21/16 1339   12/20/16 0200  piperacillin-tazobactam (ZOSYN) IVPB 3.375 g  Status:  Discontinued     3.375 g 12.5 mL/hr over 240 Minutes Intravenous Every 8 hours 12/20/16 0024 12/20/16 1109       Objective: Vitals:   12/24/16 1505 12/24/16 1510 12/24/16 1525 12/24/16 1637  BP:  120/72 116/65 (!) 130/57 132/67  Pulse: (!) 104 (!) 103 (!) 102 (!) 108  Resp: 20 20 20    Temp:    99.8 F (37.7 C)  TempSrc:    Oral  SpO2: 95% 97% 96%   Weight:      Height:        Intake/Output Summary (Last 24 hours) at 12/24/16 1639 Last data filed at 12/24/16 1500  Gross per 24 hour  Intake               80 ml  Output             1556 ml  Net            -1476 ml   Filed Weights   12/19/16 2324  Weight: 107.5 kg (237 lb)    Examination: General exam:  Appears uncomfortable  HEENT: PERRLA, oral mucosa moist, no sclera icterus or thrush Respiratory system: Clear to auscultation. RR in mid 20s on my exam. - CT drain with yellow fluid-  crackles in left lower lung field Cardiovascular system: S1 & S2 heard, RRR.  No murmurs  Gastrointestinal system: Abdomen soft, non-tender, nondistended. Normal bowel sound. No organomegaly Central nervous system: Alert and oriented. No focal neurological deficits. Extremities: No cyanosis, clubbing or edema Skin: No rashes or ulcers Psychiatry:  Mood & affect appropriate.     Data Reviewed: I have personally reviewed following labs and imaging studies  CBC:  Recent Labs Lab 12/20/16 0011 12/21/16 0242 12/22/16 0211 12/23/16 0401 12/24/16 0634  WBC 26.5* 26.0* 25.6* 19.7* 20.1*  HGB 10.3* 10.7* 10.9* 9.8* 9.8*  HCT 31.6* 32.9* 33.6* 30.0* 30.3*  MCV 82.3 83.9 83.0 82.6 82.6  PLT 529* 544* 610* 602* 619*   Basic Metabolic Panel:  Recent Labs Lab 12/21/16 0242 12/21/16 0840 12/21/16 1847 12/22/16 0211 12/23/16 0401 12/24/16 0634  NA 127*  --  124* 132* 127* 126*  K 3.0*  --  3.6 3.5 3.9 4.0  CL 93*  --  90* 94* 91* 90*  CO2 27  --  26 28 26 29   GLUCOSE 220*  --  260* 193* 196* 156*  BUN <5*  --  <5* <5* <5* <5*  CREATININE 0.49*  --  0.60* 0.50* 0.51* 0.42*  CALCIUM 7.9*  --  7.9* 8.2* 8.3* 8.5*  MG  --  1.6*  --   --   --   --    GFR: Estimated Creatinine Clearance: 171.5 mL/min (A) (by C-G formula based on SCr  of 0.42 mg/dL (L)). Liver Function Tests:  Recent Labs Lab 12/20/16 0909  PROT 5.9*   No results for input(s): LIPASE, AMYLASE in the last 168 hours. No results for input(s): AMMONIA in the last 168 hours. Coagulation Profile:  Recent Labs Lab 12/23/16 0755  INR 1.20   Cardiac Enzymes:  Recent Labs Lab 12/22/16 1212  CKTOTAL 20*   BNP (last 3 results) No results for input(s): PROBNP in the last 8760 hours. HbA1C: No results for input(s): HGBA1C in the last 72 hours. CBG:  Recent Labs Lab 12/24/16 0033 12/24/16 0356 12/24/16 0847 12/24/16 1144 12/24/16 1632  GLUCAP 230* 165* 165* 199* 130*   Lipid Profile: No results for input(s): CHOL, HDL, LDLCALC, TRIG, CHOLHDL, LDLDIRECT in the last 72 hours. Thyroid Function Tests: No results for input(s): TSH, T4TOTAL, FREET4, T3FREE, THYROIDAB in the last 72 hours. Anemia Panel: No results for input(s): VITAMINB12, FOLATE, FERRITIN, TIBC, IRON, RETICCTPCT in the last 72 hours. Urine analysis: No results found for: COLORURINE, APPEARANCEUR, LABSPEC, PHURINE, GLUCOSEU, HGBUR, BILIRUBINUR, KETONESUR, PROTEINUR, UROBILINOGEN, NITRITE, LEUKOCYTESUR Sepsis Labs: @LABRCNTIP (procalcitonin:4,lacticidven:4) ) Recent Results (from the past 240 hour(s))  Blood culture (routine x 2)     Status: Abnormal   Collection Time: 12/20/16 12:01 AM  Result Value Ref Range Status   Specimen Description BLOOD RIGHT HAND  Final   Special Requests   Final    BOTTLES DRAWN AEROBIC AND ANAEROBIC Blood Culture adequate volume   Culture  Setup Time   Final    GRAM POSITIVE COCCI IN CLUSTERS ANAEROBIC BOTTLE ONLY CRITICAL RESULT CALLED TO, READ BACK BY AND VERIFIED WITH: K. COOK PHARMD, AT 0701 12/22/16 BY D.VANHOOK    Culture (A)  Final    STAPHYLOCOCCUS AUREUS SUSCEPTIBILITIES PERFORMED ON PREVIOUS CULTURE WITHIN THE LAST 5 DAYS.    Report Status  12/24/2016 FINAL  Final  Blood culture (routine x 2)     Status: Abnormal   Collection Time:  12/20/16 12:20 AM  Result Value Ref Range Status   Specimen Description BLOOD LEFT ANTECUBITAL  Final   Special Requests   Final    BOTTLES DRAWN AEROBIC AND ANAEROBIC Blood Culture adequate volume   Culture  Setup Time   Final    GRAM POSITIVE COCCI IN CLUSTERS ANAEROBIC BOTTLE ONLY CRITICAL RESULT CALLED TO, READ BACK BY AND VERIFIED WITH: Patrick North, PHARMD 2111 12/21/2016 T. TYSOR    Culture METHICILLIN RESISTANT STAPHYLOCOCCUS AUREUS (A)  Final   Report Status 12/23/2016 FINAL  Final   Organism ID, Bacteria METHICILLIN RESISTANT STAPHYLOCOCCUS AUREUS  Final      Susceptibility   Methicillin resistant staphylococcus aureus - MIC*    CIPROFLOXACIN >=8 RESISTANT Resistant     ERYTHROMYCIN <=0.25 SENSITIVE Sensitive     GENTAMICIN <=0.5 SENSITIVE Sensitive     OXACILLIN >=4 RESISTANT Resistant     TETRACYCLINE <=1 SENSITIVE Sensitive     VANCOMYCIN <=0.5 SENSITIVE Sensitive     TRIMETH/SULFA <=10 SENSITIVE Sensitive     CLINDAMYCIN <=0.25 SENSITIVE Sensitive     RIFAMPIN <=0.5 SENSITIVE Sensitive     Inducible Clindamycin NEGATIVE Sensitive     * METHICILLIN RESISTANT STAPHYLOCOCCUS AUREUS  Blood Culture ID Panel (Reflexed)     Status: Abnormal   Collection Time: 12/20/16 12:20 AM  Result Value Ref Range Status   Enterococcus species NOT DETECTED NOT DETECTED Final   Listeria monocytogenes NOT DETECTED NOT DETECTED Final   Staphylococcus species DETECTED (A) NOT DETECTED Final    Comment: CRITICAL RESULT CALLED TO, READ BACK BY AND VERIFIED WITH: Patrick North, PHARMD 2111 12/21/2016 T. TYSOR    Staphylococcus aureus DETECTED (A) NOT DETECTED Final    Comment: Methicillin (oxacillin)-resistant Staphylococcus aureus (MRSA). MRSA is predictably resistant to beta-lactam antibiotics (except ceftaroline). Preferred therapy is vancomycin unless clinically contraindicated. Patient requires contact precautions if  hospitalized. CRITICAL RESULT CALLED TO, READ BACK BY AND VERIFIED  WITH: Patrick North, PHARMD 2111 12/21/2016 T. TYSOR    Methicillin resistance DETECTED (A) NOT DETECTED Final    Comment: CRITICAL RESULT CALLED TO, READ BACK BY AND VERIFIED WITH: Patrick North, PHARMD 2111 12/21/2016 T. TYSOR    Streptococcus species NOT DETECTED NOT DETECTED Final   Streptococcus agalactiae NOT DETECTED NOT DETECTED Final   Streptococcus pneumoniae NOT DETECTED NOT DETECTED Final   Streptococcus pyogenes NOT DETECTED NOT DETECTED Final   Acinetobacter baumannii NOT DETECTED NOT DETECTED Final   Enterobacteriaceae species NOT DETECTED NOT DETECTED Final   Enterobacter cloacae complex NOT DETECTED NOT DETECTED Final   Escherichia coli NOT DETECTED NOT DETECTED Final   Klebsiella oxytoca NOT DETECTED NOT DETECTED Final   Klebsiella pneumoniae NOT DETECTED NOT DETECTED Final   Proteus species NOT DETECTED NOT DETECTED Final   Serratia marcescens NOT DETECTED NOT DETECTED Final   Haemophilus influenzae NOT DETECTED NOT DETECTED Final   Neisseria meningitidis NOT DETECTED NOT DETECTED Final   Pseudomonas aeruginosa NOT DETECTED NOT DETECTED Final   Candida albicans NOT DETECTED NOT DETECTED Final   Candida glabrata NOT DETECTED NOT DETECTED Final   Candida krusei NOT DETECTED NOT DETECTED Final   Candida parapsilosis NOT DETECTED NOT DETECTED Final   Candida tropicalis NOT DETECTED NOT DETECTED Final  Aerobic/Anaerobic Culture (surgical/deep wound)     Status: None (Preliminary result)   Collection Time: 12/20/16  4:34 PM  Result Value Ref Range Status  Specimen Description WOUND PLEURAL LEFT  Final   Special Requests Normal  Final   Gram Stain   Final    ABUNDANT WBC PRESENT, PREDOMINANTLY PMN MODERATE GRAM POSITIVE COCCI IN CLUSTERS    Culture   Final    ABUNDANT METHICILLIN RESISTANT STAPHYLOCOCCUS AUREUS NO ANAEROBES ISOLATED; CULTURE IN PROGRESS FOR 5 DAYS    Report Status PENDING  Incomplete   Organism ID, Bacteria METHICILLIN RESISTANT STAPHYLOCOCCUS  AUREUS  Final      Susceptibility   Methicillin resistant staphylococcus aureus - MIC*    CIPROFLOXACIN >=8 RESISTANT Resistant     ERYTHROMYCIN <=0.25 SENSITIVE Sensitive     GENTAMICIN <=0.5 SENSITIVE Sensitive     OXACILLIN >=4 RESISTANT Resistant     TETRACYCLINE <=1 SENSITIVE Sensitive     VANCOMYCIN 1 SENSITIVE Sensitive     TRIMETH/SULFA <=10 SENSITIVE Sensitive     CLINDAMYCIN <=0.25 SENSITIVE Sensitive     RIFAMPIN <=0.5 SENSITIVE Sensitive     Inducible Clindamycin NEGATIVE Sensitive     * ABUNDANT METHICILLIN RESISTANT STAPHYLOCOCCUS AUREUS  MRSA PCR Screening     Status: Abnormal   Collection Time: 12/21/16  7:59 AM  Result Value Ref Range Status   MRSA by PCR POSITIVE (A) NEGATIVE Final    Comment:        The GeneXpert MRSA Assay (FDA approved for NASAL specimens only), is one component of a comprehensive MRSA colonization surveillance program. It is not intended to diagnose MRSA infection nor to guide or monitor treatment for MRSA infections. RESULT CALLED TO, READ BACK BY AND VERIFIED WITH: T.MAINIERL RN AT 1025 12/21/16 BY A.DAVIS          Radiology Studies: Ct Chest W Contrast  Result Date: 12/23/2016 CLINICAL DATA:  Status post percutaneous catheter drainage of left chest empyema on 12/20/2016. Chest x-ray demonstrates probable undrained component of loculated empyema in the upper hemithorax. EXAM: CT CHEST WITH CONTRAST TECHNIQUE: Multidetector CT imaging of the chest was performed during intravenous contrast administration. CONTRAST:  < 75 mL > ISOVUE-300 IOPAMIDOL (ISOVUE-300) INJECTION 61% COMPARISON:  CT of the chest on 12/20/2016 FINDINGS: Cardiovascular: Stable and normal heart size. Thoracic aorta and central pulmonary arteries are unremarkable. Trace pericardial fluid towards the base of the heart. Mediastinum/Nodes: Several borderline to mildly enlarged upper mediastinal lymph nodes again noted which are likely reactive and appear unchanged compared  to the prior CT. Lungs/Pleura: Multiple partially cavitary nodules consistent with septic emboli scattered throughout both lungs appear grossly stable since the prior CT. Pigtail drainage catheter extends from the lateral chest wall into post a lateral empyema. This appears smaller in overall volume compared to the prior study with some fluid remaining as well as air in the loculated collection. Undrained loculated component of empyema along the superior and anterior hemithorax shows enlargement since the prior CT and now measures approximately 5.6 cm in greatest width compared to 4.6 cm at the same level 3 days ago. Undrained medial component of empyema adjacent to the mediastinum and extending to abut the pericardium and left heart border also appears enlarged. At the level of the superior aortic arch this now measures 2.6 cm in thickness compared to 1.5 cm previously. Basilar component abutting the pericardium near the left ventricle is also larger and measures approximately 5.6 cm in thickness compared to 3.9 cm previously. Small right-sided pleural effusion appears stable. Upper Abdomen: No acute abnormality. Musculoskeletal: Paraspinal inflammation and multiple small fluid collections in the left posterior paraspinous musculature appear grossly stable. IMPRESSION:  Decrease in size of the drained posterolateral left-sided empyema. Undrained loculated components along the superior and anterior hemithorax as well as the medial hemithorax abutting the mediastinum and pericardium have enlarged since the prior scan. Multiple partially cavitary septic emboli scattered throughout both lungs are grossly stable. Paraspinal inflammation with multiple small paraspinal abscesses appear grossly stable. Electronically Signed   By: Irish Lack M.D.   On: 12/23/2016 16:29   Dg Chest Port 1 View  Result Date: 12/24/2016 CLINICAL DATA:  Follow-up chest tube EXAM: PORTABLE CHEST 1 VIEW COMPARISON:  12/23/2016 FINDINGS:  Cardiac shadow remains enlarged. A left-sided thoracostomy catheter is again identified and stable. The collection surrounding the drainage catheter appears to continue to decrease when compare with the prior exam. Loculated superior component is again identified and stable. The right lung remains clear. IMPRESSION: Stable loculated effusions superiorly on the left. Stable left drainage tube with overall decrease in more inferior collection. Electronically Signed   By: Alcide Clever M.D.   On: 12/24/2016 08:29   Dg Chest Port 1 View  Result Date: 12/23/2016 CLINICAL DATA:  Left-sided chest tube . EXAM: PORTABLE CHEST 1 VIEW COMPARISON:  12/22/2016. FINDINGS: Mediastinum is stable. Left chest tube in stable position. No focal infiltrate. Loculated left-sided pleural effusion again noted without change from prior exam. No pneumothorax. Stable cardiomegaly. IMPRESSION: 1. Left-sided chest tube is in stable position. Stable loculated left pleural effusion. Chest is unchanged from prior exam. No pneumothorax. 2. Stable cardiomegaly . Electronically Signed   By: Maisie Fus  Register   On: 12/23/2016 07:24      Scheduled Meds: . Chlorhexidine Gluconate Cloth  6 each Topical Q0600  . doxycycline  100 mg Oral Q12H  . [START ON 12/25/2016] enoxaparin (LOVENOX) injection  40 mg Subcutaneous Q24H  . fentaNYL      . insulin aspart  0-15 Units Subcutaneous Q4H  . insulin glargine  42 Units Subcutaneous Daily  . lidocaine (PF)      . midazolam      . mupirocin ointment  1 application Nasal BID  . nicotine  21 mg Transdermal Daily  . oxyCODONE  5 mg Oral 6 X Daily  . polyethylene glycol  17 g Oral Daily  . senna-docusate  2 tablet Oral BID   Continuous Infusions: . DAPTOmycin (CUBICIN)  IV Stopped (12/24/16 1229)     LOS: 4 days    Time spent in minutes: 35    Makana Rostad, MD [facility] Pager: [email or web address] Password [facility] 12/24/2016, 4:39 PM

## 2016-12-24 NOTE — Progress Notes (Signed)
Admit: 12/19/2016 LOS: 4  [name] is a 26yo male with disseminated MRSA and consistently mildly low sodium since admission from which he is asymptomatic. Serum osm, urine osm, and urine Na consistent with SIADH.  Subjective:  Patient without complaints of headache, nausea, vomiting, dizziness, weakness. He has back pain.   07/17 0701 - 07/18 0700 In: 117.2 [IV Piggyback:117.2] Out: 2236 [Urine:2205; Drains:30; Stool:1]  Filed Weights   12/19/16 2324  Weight: 237 lb (107.5 kg)    Scheduled Meds: . Chlorhexidine Gluconate Cloth  6 each Topical Q0600  . doxycycline  100 mg Oral Q12H  . [START ON 12/25/2016] enoxaparin (LOVENOX) injection  40 mg Subcutaneous Q24H  . insulin aspart  0-15 Units Subcutaneous Q4H  . insulin glargine  42 Units Subcutaneous Daily  . mupirocin ointment  1 application Nasal BID  . nicotine  21 mg Transdermal Daily  . oxyCODONE  5 mg Oral 6 X Daily  . polyethylene glycol  17 g Oral Daily  . senna-docusate  2 tablet Oral BID   Continuous Infusions: . DAPTOmycin (CUBICIN)  IV Stopped (12/23/16 1237)   PRN Meds:.acetaminophen, bisacodyl, HYDROmorphone (DILAUDID) injection, ondansetron (ZOFRAN) IV  Current Labs: reviewed - Na stable at 126, K 4.0, BUN <5, Cr 0.42, Hgb stable at 9.8  Physical Exam:  Blood pressure 119/66, pulse (!) 103, temperature 98.9 F (37.2 C), temperature source Oral, resp. rate (!) 27, height 5\' 11"  (1.803 m), weight 237 lb (107.5 kg), SpO2 94 %.  Constitutional: NAD CV: RRR, no murmurs, rubs or gallops Resp: CTAB on anterior chest Abd: soft, NDNT, +BS  A 1. Asymptomatic hypotonic, euvolemic hyponatremia - SIADH - Na stable today at 126; urine output decreased to 2L yesterday with decreased intake 2. Disseminated MRSA infection - has left chest tube, scheduled for second left chest tube later today to persistent fluid loculations  P 1. Follow daily Bmets, f/u daily urine osmolality  2. Once diet restarted, continue fluid  restriction to 1-1.5L per day 3. Rest per primary and consultants   Nyra MarketGorica Adiel Mcnamara MD PGY-2 12/24/2016, 10:29 AM   Recent Labs Lab 12/22/16 0211 12/23/16 0401 12/24/16 0634  NA 132* 127* 126*  K 3.5 3.9 4.0  CL 94* 91* 90*  CO2 28 26 29   GLUCOSE 193* 196* 156*  BUN <5* <5* <5*  CREATININE 0.50* 0.51* 0.42*  CALCIUM 8.2* 8.3* 8.5*    Recent Labs Lab 12/22/16 0211 12/23/16 0401 12/24/16 0634  WBC 25.6* 19.7* 20.1*  HGB 10.9* 9.8* 9.8*  HCT 33.6* 30.0* 30.3*  MCV 83.0 82.6 82.6  PLT 610* 602* 619*

## 2016-12-24 NOTE — Sedation Documentation (Signed)
Patient is resting comfortably. Pain currently 9/10 sore left upper back

## 2016-12-24 NOTE — Procedures (Signed)
EMPYEMA  S/P ANTERIOR LEFT CHEST TUBE WITH CT GUIDANCE  NO COMP STABLE CX SENT FULL REPORT IN PACS

## 2016-12-25 ENCOUNTER — Inpatient Hospital Stay (HOSPITAL_COMMUNITY): Payer: Self-pay

## 2016-12-25 DIAGNOSIS — B9562 Methicillin resistant Staphylococcus aureus infection as the cause of diseases classified elsewhere: Secondary | ICD-10-CM

## 2016-12-25 DIAGNOSIS — R7881 Bacteremia: Secondary | ICD-10-CM

## 2016-12-25 DIAGNOSIS — J90 Pleural effusion, not elsewhere classified: Secondary | ICD-10-CM | POA: Diagnosis not present

## 2016-12-25 LAB — AEROBIC/ANAEROBIC CULTURE W GRAM STAIN (SURGICAL/DEEP WOUND)

## 2016-12-25 LAB — GLUCOSE, CAPILLARY
GLUCOSE-CAPILLARY: 321 mg/dL — AB (ref 65–99)
GLUCOSE-CAPILLARY: 216 mg/dL — AB (ref 65–99)
Glucose-Capillary: 171 mg/dL — ABNORMAL HIGH (ref 65–99)
Glucose-Capillary: 228 mg/dL — ABNORMAL HIGH (ref 65–99)
Glucose-Capillary: 310 mg/dL — ABNORMAL HIGH (ref 65–99)

## 2016-12-25 LAB — BASIC METABOLIC PANEL
ANION GAP: 7 (ref 5–15)
BUN: 5 mg/dL — AB (ref 6–20)
CALCIUM: 8.2 mg/dL — AB (ref 8.9–10.3)
CO2: 29 mmol/L (ref 22–32)
Chloride: 91 mmol/L — ABNORMAL LOW (ref 101–111)
Creatinine, Ser: 0.48 mg/dL — ABNORMAL LOW (ref 0.61–1.24)
GFR calc Af Amer: >60 mL/min (ref >60)
GFR calc non Af Amer: >60 mL/min (ref >60)
GLUCOSE: 180 mg/dL — AB (ref 65–99)
POTASSIUM: 4 mmol/L (ref 3.5–5.1)
Sodium: 127 mmol/L — ABNORMAL LOW (ref 135–145)

## 2016-12-25 LAB — AEROBIC/ANAEROBIC CULTURE (SURGICAL/DEEP WOUND): SPECIAL REQUESTS: NORMAL

## 2016-12-25 LAB — CBC
HEMATOCRIT: 29.9 % — AB (ref 39.0–52.0)
Hemoglobin: 9.6 g/dL — ABNORMAL LOW (ref 13.0–17.0)
MCH: 26.6 pg (ref 26.0–34.0)
MCHC: 32.1 g/dL (ref 30.0–36.0)
MCV: 82.8 fL (ref 78.0–100.0)
Platelets: 580 10*3/uL — ABNORMAL HIGH (ref 150–400)
RBC: 3.61 MIL/uL — ABNORMAL LOW (ref 4.22–5.81)
RDW: 14.1 % (ref 11.5–15.5)
WBC: 20.2 10*3/uL — ABNORMAL HIGH (ref 4.0–10.5)

## 2016-12-25 LAB — OSMOLALITY, URINE: Osmolality, Ur: 371 mOsm/kg (ref 300–900)

## 2016-12-25 MED ORDER — OXYCODONE-ACETAMINOPHEN 5-325 MG PO TABS
1.0000 | ORAL_TABLET | ORAL | Status: DC | PRN
  Administered 2016-12-25 – 2017-01-06 (×54): 1 via ORAL
  Filled 2016-12-25 (×55): qty 1

## 2016-12-25 MED ORDER — HYDROMORPHONE HCL 1 MG/ML IJ SOLN
0.5000 mg | INTRAMUSCULAR | Status: DC | PRN
  Administered 2016-12-25 – 2016-12-30 (×21): 0.5 mg via INTRAVENOUS
  Filled 2016-12-25 (×21): qty 0.5

## 2016-12-25 MED ORDER — INSULIN ASPART 100 UNIT/ML ~~LOC~~ SOLN
0.0000 [IU] | Freq: Three times a day (TID) | SUBCUTANEOUS | Status: DC
  Administered 2016-12-25: 3 [IU] via SUBCUTANEOUS
  Administered 2016-12-26: 5 [IU] via SUBCUTANEOUS
  Administered 2016-12-26: 2 [IU] via SUBCUTANEOUS
  Administered 2016-12-26: 11 [IU] via SUBCUTANEOUS
  Administered 2016-12-27: 5 [IU] via SUBCUTANEOUS

## 2016-12-25 MED ORDER — KETOROLAC TROMETHAMINE 30 MG/ML IJ SOLN
30.0000 mg | Freq: Three times a day (TID) | INTRAMUSCULAR | Status: DC
  Administered 2016-12-25 – 2016-12-29 (×14): 30 mg via INTRAVENOUS
  Filled 2016-12-25 (×16): qty 1

## 2016-12-25 MED ORDER — INSULIN ASPART 100 UNIT/ML ~~LOC~~ SOLN
5.0000 [IU] | Freq: Three times a day (TID) | SUBCUTANEOUS | Status: DC
  Administered 2016-12-25: 5 [IU] via SUBCUTANEOUS

## 2016-12-25 NOTE — Progress Notes (Signed)
Pharmacy Antibiotic Note  Keith Riggs is a 26 y.o. male admitted on day # 3 daptomycin. for disseminated MRSA infection:  bacteremia, empyema, and discitis with epidural abscess. Was previously on vancomycin, but therapeutic troughs were never obtained, so switched to daptomycin.  Pharmacy has been consulted for Daptomycin dosing. Various cultures have repeatedly grown MRSA; including lung abscess cultures for which doxycycline was initiated for better lung penetration. Patient is currently afebrile with stable renal function and white count (~20K). A new anterior chest tube was placed and repeat blood cultures are pending. TTE negative for vegetations.   Plan:  Continue Daptomycin ~8 mg/kg (860 mg) IV q24hrs.  Weekly CK while on Daptomycin.  Follow renal function, final culture data, clinical progress and antibiotic plans.    Height: 5\' 11"  (180.3 cm) Weight: 237 lb (107.5 kg) IBW/kg (Calculated) : 75.3  Temp (24hrs), Avg:99 F (37.2 C), Min:98.2 F (36.8 C), Max:99.8 F (37.7 C)   Recent Labs Lab 12/21/16 0242 12/21/16 1847 12/22/16 0211 12/22/16 1648 12/23/16 0401 12/24/16 0634 12/25/16 0330  WBC 26.0*  --  25.6*  --  19.7* 20.1* 20.2*  CREATININE 0.49* 0.60* 0.50*  --  0.51* 0.42* 0.48*  VANCOTROUGH  --  <4*  --  5*  --   --   --     Estimated Creatinine Clearance: 171.5 mL/min (A) (by C-G formula based on SCr of 0.48 mg/dL (L)).    Allergies  Allergen Reactions  . Bee Venom Anaphylaxis  . Voltaren [Diclofenac Sodium] Other (See Comments)    "Makes me feel not myself"  . Latex Rash    Antimicrobials this admission:  (cephalexin as outpatient prior to admission) Zosyn 7/14 >> x1 Vancomycin 7/14 CFTX 7/14>7/16 Dapto 7/16> Doxy 7/17 >  Dose adjustments this admission: 7/15 VT <4 1000mg  q8 hrs (increased to 1000mg  q6 hrs) 7/16 VT = 5 mcg/ml on 1gm IV q6h  7/16 CK: 20  Microbiology results: 7/18 BCx: Sent 7/18 Abscess:  7/15 MRSA PCR: Positive 7/14 Wound  Cx: MRSA 7/14 BCX: MRSA  Thank you for allowing pharmacy to be a part of this patient's care.  Keith Riggs, PharmD Clinical Pharmacist [phone] 12/25/2016 8:41 AM

## 2016-12-25 NOTE — Progress Notes (Signed)
[facility] Progress Note  Patient: Keith Riggs UJW:[id number]   PCP: System, Pcp Not In DOB: Jan 04, 1991   DOA: 12/19/2016   DOS: 12/25/2016   Date of Service: the patient was seen and examined on 12/25/2016  Subjective: Continues to have back pain. No nausea no vomiting. No fever no chills. Drowsy and sleeps during conversation.  Brief hospital course: 26 y/o with IDDM, past history of heroin abuse, cocaine abuse who get random drug tests 2 x a month. He is a transfer from [facility]. History obtained from Grandmother and father whom he lives with.  He was seen there for a right groin abscess on Wed which was I and D'd. He was seen again on Friday for a packing of the abscess and was found to have a left groin abscess with was I and D'd. He returned to there ER in the after noon because he thought his packing had fallen out. At that time, he complained of back pain and was sent to [facility] for and MRI. This showed thoracic epidural abscesses causing severe spinal cord compression, paraspinal myositis and abscesses, T6 osteo and b/l pleural effusions. Currently further plan is to monitor improvement in output from the chest tube..  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   Osteomyelitis of thoracic spine   Acute pyelonephritis   Soft tissue abscess of inguinal region- bilaterally  - lactic acid normal, Wbc 26, fever 100.3, tachycardia - Neurosurgery was consulted- recommend conservative management- cont Neuro checks every 4 hrs- re-call NS if any neurological changes - culture from wound on 7/11 is showing Strep Algactiae - blood culture from [facility] with gram + cocci in clusters - he has been on Keflex at home - MRSA PCR + - pleural fluid>> MRSA - blood culture 7/14 >> MRSA -  ID consulted  - 2 D ECHO negative for vegetation- no need for TEE as he will need a prolonged course of Vanc anyway -  7/16- Vanc trough < 4,  I discussed this with ID requested Daptomycin which was started on 7/16  - 7/17- Doxy started for better lung penetration than Dapto - WBC counts - trending down    Left sided loculated effusion and septic pulmonary emboli - related to above  - strep pneumo neg - CT surgery consulted- not a VATS candidate at this time- recommended IR consult for pigtail catheter- IR consulted and drain placed on 7/14 - per CT surgery, he needs a second drain in left upper chest- IR consulted again, tolerated procedure , repeat chest x-ray today shows improvement in collection volume.  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    IVDU (intravenous drug user) - has used IV Cocaine and Heroin in the past - states he has not used in 2-3 months - grandmother states he get random drug tests 2 x month - UDS shows Opiates which he is receiving int he hospital   Cigarette smoker - Nicotine patch    Normocytic anemia - anemia >> Low Iron levels and normal Iron binding,  retic count normal- Ferritin high likely due to infection - consistent with AOCD  Morbid Obesity  Body mass index is 33.05 kg/m.  Pain management. Patient was placed on scheduled OxyIR 6 times a day as well as on Dilaudid 0.5-1 mg every 2-4 hours as needed. On my assessment the patient is drowsy and sleepy and lethargic. At present I do not think the patient notes a scheduled narcotic medication. We change it to when necessary Percocet. Also changing Dilaudid from 0.5-1 mg 0.5 mg every 4 hours when necessary. Patient the patient on scheduled Toradol  as I believe he can tolerate that. If the pain is not adequately controlled would prefer to go on oral narcotic regimen rather than IV narcotic regimen.  Diet: regular diet DVT Prophylaxis: subcutaneous Heparin  Advance goals of care discussion: full code  Family Communication: no family was present at bedside, at the time of interview.   Disposition:  Discharge to be determined.  Consultants: ID, neurosurgery, CT surgery, nephrology, IR Procedures: IR guided drain placement, left chest tube 7/14, drainage catheter placement 7/18  Antibiotics: Anti-infectives    Start     Dose/Rate Route Frequency Ordered Stop   12/23/16 1230  doxycycline (VIBRA-TABS) tablet 100 mg     100 mg Oral Every 12 hours 12/23/16 1203     12/23/16 1200  DAPTOmycin (CUBICIN) 860 mg in sodium chloride 0.9 % IVPB     8 mg/kg  107.5 kg 234.4 mL/hr over 30 Minutes Intravenous Every 24 hours 12/22/16 1858     12/22/16 1230  DAPTOmycin (CUBICIN) 860 mg in sodium chloride 0.9 % IVPB     8 mg/kg  107.5 kg 234.4 mL/hr over 30 Minutes Intravenous Every 24 hours 12/22/16 1151 12/22/16 1337   12/21/16 2200  vancomycin (VANCOCIN) IVPB 1000 mg/200 mL premix  Status:  Discontinued     1,000 mg 200 mL/hr over 60 Minutes Intravenous Every 6 hours 12/21/16 2110 12/22/16 1850   12/21/16 2000  vancomycin (VANCOCIN) IVPB 1000 mg/200 mL premix  Status:  Discontinued     1,000 mg 200 mL/hr over 60 Minutes Intravenous Every 8 hours 12/21/16 1339 12/21/16 2109   12/20/16 1300  piperacillin-tazobactam (ZOSYN) IVPB 3.375 g  Status:  Discontinued     3.375 g 12.5 mL/hr over 240 Minutes Intravenous Every 8 hours 12/20/16 1109 12/20/16 1214   12/20/16 1300  cefTRIAXone (ROCEPHIN) 2 g in dextrose 5 % 50 mL IVPB  Status:  Discontinued     2 g 100 mL/hr over 30 Minutes Intravenous Every 24 hours 12/20/16 1214 12/22/16 2001   12/20/16 0200  vancomycin (VANCOCIN) IVPB 1000 mg/200 mL premix  Status:  Discontinued     1,000 mg 200 mL/hr  over 60 Minutes Intravenous Every 8 hours 12/20/16 0024 12/21/16 1339   12/20/16 0200  piperacillin-tazobactam (ZOSYN) IVPB 3.375 g  Status:  Discontinued     3.375 g 12.5 mL/hr over 240 Minutes Intravenous Every 8 hours 12/20/16 0024 12/20/16 1109       Objective: Physical Exam: Vitals:   12/25/16 0030 12/25/16 0445 12/25/16 1200 12/25/16 1404  BP: 122/65 128/69 (!) 143/77   Pulse:   82 89  Resp: (!) 26 16 20    Temp: 98.9 F (37.2 C) 98.2 F (36.8 C)  98.3 F (36.8 C)  TempSrc: Oral Oral  Oral  SpO2: (!) 88% 93% 100%   Weight:      Height:        Intake/Output Summary (Last 24 hours) at 12/25/16  1725 Last data filed at 12/25/16 1300  Gross per 24 hour  Intake              510 ml  Output              900 ml  Net             -390 ml   Filed Weights   12/19/16 2324  Weight: 107.5 kg (237 lb)   General: drowsy and Oriented to Time, Place and Person. Appear in mild distress, affect appropriate Eyes: PERRL, Conjunctiva normal ENT: Oral Mucosa clear moist. Neck: no JVD, no Abnormal Mass Or lumps Cardiovascular: S1 and S2 Present, no Murmur, Peripheral Pulses Present Respiratory: normal respiratory effort, Bilateral Air entry equal and Decreased, no use of accessory muscle, Clear to Auscultation, no Crackles, no wheezes Abdomen: Bowel Sound present, Soft and no tenderness, no hernia Skin: no redness, no Rash, no induration Extremities: no Pedal edema, no calf tenderness Neurologic: Grossly no focal neuro deficit. Bilaterally Equal motor strength  Data Reviewed: CBC:  Recent Labs Lab 12/21/16 0242 12/22/16 0211 12/23/16 0401 12/24/16 0634 12/25/16 0330  WBC 26.0* 25.6* 19.7* 20.1* 20.2*  HGB 10.7* 10.9* 9.8* 9.8* 9.6*  HCT 32.9* 33.6* 30.0* 30.3* 29.9*  MCV 83.9 83.0 82.6 82.6 82.8  PLT 544* 610* 602* 619* 580*   Basic Metabolic Panel:  Recent Labs Lab 12/21/16 0840 12/21/16 1847 12/22/16 0211 12/23/16 0401 12/24/16 0634 12/25/16 0330  NA  --  124* 132*  127* 126* 127*  K  --  3.6 3.5 3.9 4.0 4.0  CL  --  90* 94* 91* 90* 91*  CO2  --  26 28 26 29 29   GLUCOSE  --  260* 193* 196* 156* 180*  BUN  --  <5* <5* <5* <5* 5*  CREATININE  --  0.60* 0.50* 0.51* 0.42* 0.48*  CALCIUM  --  7.9* 8.2* 8.3* 8.5* 8.2*  MG 1.6*  --   --   --   --   --     Liver Function Tests:  Recent Labs Lab 12/20/16 0909  PROT 5.9*   No results for input(s): LIPASE, AMYLASE in the last 168 hours. No results for input(s): AMMONIA in the last 168 hours. Coagulation Profile:  Recent Labs Lab 12/23/16 0755  INR 1.20   Cardiac Enzymes:  Recent Labs Lab 12/22/16 1212  CKTOTAL 20*   BNP (last 3 results) No results for input(s): PROBNP in the last 8760 hours. CBG:  Recent Labs Lab 12/24/16 2307 12/25/16 0030 12/25/16 0443 12/25/16 1202 12/25/16 1634  GLUCAP 285* 228* 171* 321* 216*   Studies: Dg Chest Port 1 View  Result Date: 12/25/2016 CLINICAL DATA:  Status post chest tube placement EXAM: PORTABLE CHEST 1 VIEW COMPARISON:  12/24/2016 FINDINGS: Cardiac shadow remains enlarged. The lungs are well aerated bilaterally without focal infiltrate. Two left-sided chest tubes are now seen with significant reduction in the more superior loculated fluid collection. Only mild residual fluid remains. No new focal abnormality is noted. IMPRESSION: Significant improvement in left-sided fluid collection following drainage catheter placement. Electronically Signed   By: Alcide Clever M.D.   On: 12/25/2016 08:16    Scheduled Meds: . Chlorhexidine Gluconate Cloth  6 each Topical Q0600  . doxycycline  100 mg Oral Q12H  . enoxaparin (LOVENOX) injection  40 mg Subcutaneous Q24H  . insulin aspart  0-15 Units Subcutaneous TID WC  . insulin aspart  5 Units Subcutaneous TID WC  . insulin glargine  42 Units Subcutaneous Daily  . ketorolac  30 mg Intravenous Q8H  . mupirocin ointment  1 application Nasal BID  . nicotine  21 mg Transdermal Daily  . polyethylene glycol  17  g Oral Daily  . senna-docusate  2 tablet Oral BID   Continuous Infusions: . DAPTOmycin (CUBICIN)  IV Stopped (12/25/16 1233)   PRN Meds: acetaminophen, bisacodyl, HYDROmorphone (DILAUDID) injection, ondansetron (ZOFRAN) IV, oxyCODONE-acetaminophen  Time spent: 35 minutes  Author: Lynden Oxford, MD [facility] Pager: [phone] 12/25/2016 5:25 PM  If 7PM-7AM, please contact night-coverage at [email or web address], password [facility]

## 2016-12-25 NOTE — Progress Notes (Signed)
Referring Physician(s): Dr Leodis Binet  Supervising Physician: Jolaine Click  Patient Status:  [facility] - In-pt  Chief Complaint:  EMPYEMA S/P ANTERIOR LEFT CHEST TUBE WITH CT GUIDANCE 7/18  Subjective:  Left lateral chest tube drain placed 7/14 Left anterior chest tube placed 7/18 CXR 7/19: IMPRESSION: Significant improvement in left-sided fluid collection following drainage catheter placement Pt is up in chair Feeling some better today Drains are intact Output for both is serous fluid +MRSA 7/14 drain Pending Cx for 7/18 drain  Allergies: Bee venom; Voltaren [diclofenac sodium]; and Latex  Medications: Prior to Admission medications   Medication Sig Start Date End Date Taking? Authorizing Provider  cephALEXin (KEFLEX) 500 MG capsule Take 500 mg by mouth 4 (four) times daily. 12/15/16  Yes [provider]  HYDROcodone-acetaminophen (NORCO/VICODIN) 5-325 MG tablet Take 1 tablet by mouth every 4 (four) hours as needed. for pain 12/17/16  Yes [provider]  Insulin Glargine (TOUJEO MAX SOLOSTAR) 300 UNIT/ML SOPN Inject 40 Units into the skin daily.   Yes [provider]  metFORMIN (GLUCOPHAGE) 500 MG tablet Take 500 mg by mouth 2 (two) times daily with a meal.   Yes [provider]     Vital Signs: BP (!) 143/77   Pulse 89   Temp 98.3 F (36.8 C) (Oral)   Resp 20   Ht 5\' 11"  (1.803 m)   Wt 237 lb (107.5 kg)   SpO2 100%   BMI 33.05 kg/m   Physical Exam  Constitutional: He is oriented to person, place, and time.  Pulmonary/Chest: Effort normal. He has wheezes.  Musculoskeletal: Normal range of motion.  Neurological: He is alert and oriented to person, place, and time.  Skin: Skin is warm and dry.  Sites are clean and dry Sl tender No signs of infection at sites  Output 900 cc in left lateral chest tube drain              140 cc in left anterior chest tube drain No air leak in both   Nursing note and vitals  reviewed.   Imaging: Ct Chest W Contrast  Result Date: 12/23/2016 CLINICAL DATA:  Status post percutaneous catheter drainage of left chest empyema on 12/20/2016. Chest x-ray demonstrates probable undrained component of loculated empyema in the upper hemithorax. EXAM: CT CHEST WITH CONTRAST TECHNIQUE: Multidetector CT imaging of the chest was performed during intravenous contrast administration. CONTRAST:  < 75 mL > ISOVUE-300 IOPAMIDOL (ISOVUE-300) INJECTION 61% COMPARISON:  CT of the chest on 12/20/2016 FINDINGS: Cardiovascular: Stable and normal heart size. Thoracic aorta and central pulmonary arteries are unremarkable. Trace pericardial fluid towards the base of the heart. Mediastinum/Nodes: Several borderline to mildly enlarged upper mediastinal lymph nodes again noted which are likely reactive and appear unchanged compared to the prior CT. Lungs/Pleura: Multiple partially cavitary nodules consistent with septic emboli scattered throughout both lungs appear grossly stable since the prior CT. Pigtail drainage catheter extends from the lateral chest wall into post a lateral empyema. This appears smaller in overall volume compared to the prior study with some fluid remaining as well as air in the loculated collection. Undrained loculated component of empyema along the superior and anterior hemithorax shows enlargement since the prior CT and now measures approximately 5.6 cm in greatest width compared to 4.6 cm at the same level 3 days ago. Undrained medial component of empyema adjacent to the mediastinum and extending to abut the pericardium and left heart border also appears enlarged. At the level  of the superior aortic arch this now measures 2.6 cm in thickness compared to 1.5 cm previously. Basilar component abutting the pericardium near the left ventricle is also larger and measures approximately 5.6 cm in thickness compared to 3.9 cm previously. Small right-sided pleural effusion appears stable. Upper  Abdomen: No acute abnormality. Musculoskeletal: Paraspinal inflammation and multiple small fluid collections in the left posterior paraspinous musculature appear grossly stable. IMPRESSION: Decrease in size of the drained posterolateral left-sided empyema. Undrained loculated components along the superior and anterior hemithorax as well as the medial hemithorax abutting the mediastinum and pericardium have enlarged since the prior scan. Multiple partially cavitary septic emboli scattered throughout both lungs are grossly stable. Paraspinal inflammation with multiple small paraspinal abscesses appear grossly stable. Electronically Signed   By: Irish Lack M.D.   On: 12/23/2016 16:29   Dg Chest Port 1 View  Result Date: 12/25/2016 CLINICAL DATA:  Status post chest tube placement EXAM: PORTABLE CHEST 1 VIEW COMPARISON:  12/24/2016 FINDINGS: Cardiac shadow remains enlarged. The lungs are well aerated bilaterally without focal infiltrate. Two left-sided chest tubes are now seen with significant reduction in the more superior loculated fluid collection. Only mild residual fluid remains. No new focal abnormality is noted. IMPRESSION: Significant improvement in left-sided fluid collection following drainage catheter placement. Electronically Signed   By: Alcide Clever M.D.   On: 12/25/2016 08:16   Dg Chest Port 1 View  Result Date: 12/24/2016 CLINICAL DATA:  Follow-up chest tube EXAM: PORTABLE CHEST 1 VIEW COMPARISON:  12/23/2016 FINDINGS: Cardiac shadow remains enlarged. A left-sided thoracostomy catheter is again identified and stable. The collection surrounding the drainage catheter appears to continue to decrease when compare with the prior exam. Loculated superior component is again identified and stable. The right lung remains clear. IMPRESSION: Stable loculated effusions superiorly on the left. Stable left drainage tube with overall decrease in more inferior collection. Electronically Signed   By: Alcide Clever M.D.   On: 12/24/2016 08:29   Dg Chest Port 1 View  Result Date: 12/23/2016 CLINICAL DATA:  Left-sided chest tube . EXAM: PORTABLE CHEST 1 VIEW COMPARISON:  12/22/2016. FINDINGS: Mediastinum is stable. Left chest tube in stable position. No focal infiltrate. Loculated left-sided pleural effusion again noted without change from prior exam. No pneumothorax. Stable cardiomegaly. IMPRESSION: 1. Left-sided chest tube is in stable position. Stable loculated left pleural effusion. Chest is unchanged from prior exam. No pneumothorax. 2. Stable cardiomegaly . Electronically Signed   By: Maisie Fus  Register   On: 12/23/2016 07:24   Dg Chest Port 1 View  Result Date: 12/22/2016 CLINICAL DATA:  Chest tube. EXAM: PORTABLE CHEST 1 VIEW COMPARISON:  12/21/2016 .  CT 12/20/2016. FINDINGS: Left chest tube in stable position. Loculated left apical pleural effusion improved from prior exam. Mild bibasilar subsegmental atelectasis. Stable cardiomegaly. IMPRESSION: 1. Left chest tube in stable position. Loculated left sided pleural effusion again noted, interim improvement from prior exam. No pneumothorax. 2.  Stable cardiomegaly. Electronically Signed   By: Maisie Fus  Register   On: 12/22/2016 07:23   Ct Image Guided Drainage By Percutaneous Catheter  Result Date: 12/24/2016 INDICATION: EMPYEMA, PERSISTENT LOCULATED LEFT PLEURAL COLLECTION EXAM: CT GUIDED DRAINAGE OF LEFT ANTERIOR EMPYEMA MEDICATIONS: The patient is currently admitted to the hospital and receiving intravenous antibiotics. The antibiotics were administered within an appropriate time frame prior to the initiation of the procedure. ANESTHESIA/SEDATION: 2.0 mg IV Versed 100 mcg IV Fentanyl Moderate Sedation Time:  15 MINUTES The patient was continuously monitored during the  procedure by the interventional radiology nurse under my direct supervision. COMPLICATIONS: None immediate. TECHNIQUE: Informed written consent was obtained from the patient after a  thorough discussion of the procedural risks, benefits and alternatives. All questions were addressed. Maximal Sterile Barrier Technique was utilized including caps, mask, sterile gowns, sterile gloves, sterile drape, hand hygiene and skin antiseptic. A timeout was performed prior to the initiation of the procedure. PROCEDURE: Previous imaging reviewed. Patient positioned supine. Noncontrast localization CT performed. The residual anterior loculated pleural fluid collection was localized. Overlying skin marked for an anterior approach through the second/third intercostal space. Under sterile conditions and local anesthesia, an 18 gauge access needle was advanced percutaneously under direct CT guidance into the fluid. Syringe aspiration yielded serosanguineous fluid. Sample sent for culture. Guidewire inserted followed by tract dilatation to advance a 12 [place]French drain. Retention loop formed in the collection and confirmed with CT. Syringe aspiration yielded 30 cc fluid. Catheter secured with a Prolene suture and connected to Pleur-Evac externally. Sterile dressing applied. No immediate complication. Patient tolerated the procedure well. FINDINGS: Imaging confirms CT-guided needle placement into the anterior loculated left pleural effusion compatible with empyema for drain insertion. IMPRESSION: Successful CT-guided left anterior empyema drain insertion. Electronically Signed   By: Judie PetitM.  Shick M.D.   On: 12/24/2016 17:01    Labs:  CBC:  Recent Labs  12/22/16 0211 12/23/16 0401 12/24/16 0634 12/25/16 0330  WBC 25.6* 19.7* 20.1* 20.2*  HGB 10.9* 9.8* 9.8* 9.6*  HCT 33.6* 30.0* 30.3* 29.9*  PLT 610* 602* 619* 580*    COAGS:  Recent Labs  12/23/16 0755  INR 1.20    BMP:  Recent Labs  12/22/16 0211 12/23/16 0401 12/24/16 0634 12/25/16 0330  NA 132* 127* 126* 127*  K 3.5 3.9 4.0 4.0  CL 94* 91* 90* 91*  CO2 28 26 29 29   GLUCOSE 193* 196* 156* 180*  BUN <5* <5* <5* 5*  CALCIUM 8.2* 8.3*  8.5* 8.2*  CREATININE 0.50* 0.51* 0.42* 0.48*  GFRNONAA >60 >60 >60 >60  GFRAA >60 >60 >60 >60    LIVER FUNCTION TESTS:  Recent Labs  12/20/16 0909  PROT 5.9*    Assessment and Plan:  Empyema - left Additional left anterior drain placed 7/18 Feels better; CXR shows improvement will follow  Electronically Signed: Loza Prell A, PA-C 12/25/2016, 3:39 PM   I spent a total of 15 Minutes at the the patient's bedside AND on the patient's hospital floor or unit, greater than 50% of which was counseling/coordinating care for left empyema chest tube drains

## 2016-12-25 NOTE — Care Management Note (Signed)
Case Management Note [name] RN, BSN Unit 4E-Case Manager [phone]  Patient Details  Name: Keith Riggs MRN: [id number] Date of Birth: 08/02/1990  Subjective/Objective:   Pt admitted with sepsis secondary to MRSA with osteomyelitis of spine and abscess to epidural space and groins. Also has pl. Effusions s/p CT drains x2- not a VATS candidate at this time.- hx of IVDU              Action/Plan: PTA pt lived at home with family- will need prolonged coarse of IV abx- CSW to follow for potential need for SNF due to active IVDU. -  Expected Discharge Date:                  Expected Discharge Plan:  Skilled Nursing Facility  In-House Referral:  Clinical Social Work  Discharge planning Services  CM Consult  Post Acute Care Choice:    Choice offered to:     DME Arranged:    DME Agency:     HH Arranged:    HH Agency:     Status of Service:  In process, will continue to follow  If discussed at Long Length of Stay Meetings, dates discussed:  7/19  Discharge Disposition:   Additional Comments:  Darrold SpanWebster, Dashia Caldeira Hall, RN 12/25/2016, 10:53 AM

## 2016-12-25 NOTE — Progress Notes (Addendum)
Inpatient Diabetes Program Recommendations  AACE/ADA: New Consensus Statement on Inpatient Glycemic Control (2015)  Target Ranges:  Prepandial:   less than 140 mg/dL      Peak postprandial:   less than 180 mg/dL (1-2 hours)      Critically ill patients:  140 - 180 mg/dL   Lab Results  Component Value Date   GLUCAP 321 (H) 12/25/2016  Results for Keith Riggs, Keith Riggs (MRN [id number]) as of 12/25/2016 12:47  Ref. Range 12/24/2016 08:47 12/24/2016 11:44 12/24/2016 16:32 12/24/2016 21:33 12/24/2016 23:07 12/25/2016 00:30 12/25/2016 04:43 12/25/2016 12:02  Glucose-Capillary Latest Ref Range: 65 - 99 mg/dL 213165 (H) 086199 (H) 578130 (H) 240 (H) 285 (H) 228 (H) 171 (H) 321 (H)    Diabetes history: Type 2 diabetes Outpatient Diabetes medications: Toujeo 40 units daily, Metformin 500 mg bid Current orders for Inpatient glycemic control:  Lantus 42 units daily, Novolog moderate q 4 hours Inpatient Diabetes Program Recommendations:  Please check A1C to determine pre hospitalization glycemic control.  Also consider adding Novolog meal coverage 5 units tid with meals (hold if patient eats less than 50%)- Will text page MD.   Thanks, Beryl MeagerJenny Rabecca Birge, RN, BC-ADM Inpatient Diabetes Coordinator Pager [phone] (8a-5p)

## 2016-12-25 NOTE — Progress Notes (Signed)
[facility]   Reason for visit: Follow up on disseminated MRSA infection  Interval History: left chest tube placed yesterday.  Afebrile, no chills.   Moving all extremities and walking.  No associated n/v/d.  Day 9 antibiotics Day 4 daptomycin Day 3 doxycycline Stopped vancomycin and ceftriaxone   Physical Exam: Constitutional:  Vitals:   12/25/16 0030 12/25/16 0445  BP: 122/65 128/69  Pulse:    Resp: (!) 26 16  Temp: 98.9 F (37.2 C) 98.2 F (36.8 C)   patient appears in nad Eyes: anicteric Respiratory: Normal respiratory effort; CTA B Cardiovascular: RRR GI: soft, nt, nd  Review of Systems: Constitutional: negative for fevers, chills Gastrointestinal: negative for diarrhea Integument/breast: negative for rash  Lab Results  Component Value Date   WBC 20.2 (H) 12/25/2016   HGB 9.6 (L) 12/25/2016   HCT 29.9 (L) 12/25/2016   MCV 82.8 12/25/2016   PLT 580 (H) 12/25/2016    Lab Results  Component Value Date   CREATININE 0.48 (L) 12/25/2016   BUN 5 (L) 12/25/2016   NA 127 (L) 12/25/2016   K 4.0 12/25/2016   CL 91 (L) 12/25/2016   CO2 29 12/25/2016   No results found for: ALT, AST, GGT, ALKPHOS   Microbiology: Recent Results (from the past 240 hour(s))  Blood culture (routine x 2)     Status: Abnormal   Collection Time: 12/20/16 12:01 AM  Result Value Ref Range Status   Specimen Description BLOOD RIGHT HAND  Final   Special Requests   Final    BOTTLES DRAWN AEROBIC AND ANAEROBIC Blood Culture adequate volume   Culture  Setup Time   Final    GRAM POSITIVE COCCI IN CLUSTERS ANAEROBIC BOTTLE ONLY CRITICAL RESULT CALLED TO, READ BACK BY AND VERIFIED WITH: K. COOK PHARMD, AT 0701 12/22/16 BY D.VANHOOK    Culture (A)  Final    STAPHYLOCOCCUS AUREUS SUSCEPTIBILITIES PERFORMED ON PREVIOUS CULTURE WITHIN THE LAST 5 DAYS.    Report Status 12/24/2016 FINAL  Final  Blood culture (routine x 2)     Status: Abnormal   Collection Time:  12/20/16 12:20 AM  Result Value Ref Range Status   Specimen Description BLOOD LEFT ANTECUBITAL  Final   Special Requests   Final    BOTTLES DRAWN AEROBIC AND ANAEROBIC Blood Culture adequate volume   Culture  Setup Time   Final    GRAM POSITIVE COCCI IN CLUSTERS ANAEROBIC BOTTLE ONLY CRITICAL RESULT CALLED TO, READ BACK BY AND VERIFIED WITH: Patrick North. BATCHELDER, PHARMD 2111 12/21/2016 T. TYSOR    Culture METHICILLIN RESISTANT STAPHYLOCOCCUS AUREUS (A)  Final   Report Status 12/23/2016 FINAL  Final   Organism ID, Bacteria METHICILLIN RESISTANT STAPHYLOCOCCUS AUREUS  Final      Susceptibility   Methicillin resistant staphylococcus aureus - MIC*    CIPROFLOXACIN >=8 RESISTANT Resistant     ERYTHROMYCIN <=0.25 SENSITIVE Sensitive     GENTAMICIN <=0.5 SENSITIVE Sensitive     OXACILLIN >=4 RESISTANT Resistant     TETRACYCLINE <=1 SENSITIVE Sensitive     VANCOMYCIN <=0.5 SENSITIVE Sensitive     TRIMETH/SULFA <=10 SENSITIVE Sensitive     CLINDAMYCIN <=0.25 SENSITIVE Sensitive     RIFAMPIN <=0.5 SENSITIVE Sensitive     Inducible Clindamycin NEGATIVE Sensitive     * METHICILLIN RESISTANT STAPHYLOCOCCUS AUREUS  Blood Culture ID Panel (Reflexed)     Status: Abnormal   Collection Time: 12/20/16 12:20 AM  Result Value Ref Range Status   Enterococcus  species NOT DETECTED NOT DETECTED Final   Listeria monocytogenes NOT DETECTED NOT DETECTED Final   Staphylococcus species DETECTED (A) NOT DETECTED Final    Comment: CRITICAL RESULT CALLED TO, READ BACK BY AND VERIFIED WITH: Patrick North, PHARMD 2111 12/21/2016 T. TYSOR    Staphylococcus aureus DETECTED (A) NOT DETECTED Final    Comment: Methicillin (oxacillin)-resistant Staphylococcus aureus (MRSA). MRSA is predictably resistant to beta-lactam antibiotics (except ceftaroline). Preferred therapy is vancomycin unless clinically contraindicated. Patient requires contact precautions if  hospitalized. CRITICAL RESULT CALLED TO, READ BACK BY AND VERIFIED  WITH: Patrick North, PHARMD 2111 12/21/2016 T. TYSOR    Methicillin resistance DETECTED (A) NOT DETECTED Final    Comment: CRITICAL RESULT CALLED TO, READ BACK BY AND VERIFIED WITH: Patrick North, PHARMD 2111 12/21/2016 T. TYSOR    Streptococcus species NOT DETECTED NOT DETECTED Final   Streptococcus agalactiae NOT DETECTED NOT DETECTED Final   Streptococcus pneumoniae NOT DETECTED NOT DETECTED Final   Streptococcus pyogenes NOT DETECTED NOT DETECTED Final   Acinetobacter baumannii NOT DETECTED NOT DETECTED Final   Enterobacteriaceae species NOT DETECTED NOT DETECTED Final   Enterobacter cloacae complex NOT DETECTED NOT DETECTED Final   Escherichia coli NOT DETECTED NOT DETECTED Final   Klebsiella oxytoca NOT DETECTED NOT DETECTED Final   Klebsiella pneumoniae NOT DETECTED NOT DETECTED Final   Proteus species NOT DETECTED NOT DETECTED Final   Serratia marcescens NOT DETECTED NOT DETECTED Final   Haemophilus influenzae NOT DETECTED NOT DETECTED Final   Neisseria meningitidis NOT DETECTED NOT DETECTED Final   Pseudomonas aeruginosa NOT DETECTED NOT DETECTED Final   Candida albicans NOT DETECTED NOT DETECTED Final   Candida glabrata NOT DETECTED NOT DETECTED Final   Candida krusei NOT DETECTED NOT DETECTED Final   Candida parapsilosis NOT DETECTED NOT DETECTED Final   Candida tropicalis NOT DETECTED NOT DETECTED Final  Aerobic/Anaerobic Culture (surgical/deep wound)     Status: None   Collection Time: 12/20/16  4:34 PM  Result Value Ref Range Status   Specimen Description WOUND PLEURAL LEFT  Final   Special Requests Normal  Final   Gram Stain   Final    ABUNDANT WBC PRESENT, PREDOMINANTLY PMN MODERATE GRAM POSITIVE COCCI IN CLUSTERS    Culture   Final    ABUNDANT METHICILLIN RESISTANT STAPHYLOCOCCUS AUREUS NO ANAEROBES ISOLATED    Report Status 12/25/2016 FINAL  Final   Organism ID, Bacteria METHICILLIN RESISTANT STAPHYLOCOCCUS AUREUS  Final      Susceptibility   Methicillin  resistant staphylococcus aureus - MIC*    CIPROFLOXACIN >=8 RESISTANT Resistant     ERYTHROMYCIN <=0.25 SENSITIVE Sensitive     GENTAMICIN <=0.5 SENSITIVE Sensitive     OXACILLIN >=4 RESISTANT Resistant     TETRACYCLINE <=1 SENSITIVE Sensitive     VANCOMYCIN 1 SENSITIVE Sensitive     TRIMETH/SULFA <=10 SENSITIVE Sensitive     CLINDAMYCIN <=0.25 SENSITIVE Sensitive     RIFAMPIN <=0.5 SENSITIVE Sensitive     Inducible Clindamycin NEGATIVE Sensitive     * ABUNDANT METHICILLIN RESISTANT STAPHYLOCOCCUS AUREUS  MRSA PCR Screening     Status: Abnormal   Collection Time: 12/21/16  7:59 AM  Result Value Ref Range Status   MRSA by PCR POSITIVE (A) NEGATIVE Final    Comment:        The GeneXpert MRSA Assay (FDA approved for NASAL specimens only), is one component of a comprehensive MRSA colonization surveillance program. It is not intended to diagnose MRSA infection nor to guide or monitor treatment  for MRSA infections. RESULT CALLED TO, READ BACK BY AND VERIFIED WITH: T.MAINIERL RN AT 1025 12/21/16 BY A.DAVIS   Culture, blood (routine x 2)     Status: None (Preliminary result)   Collection Time: 12/24/16  6:34 AM  Result Value Ref Range Status   Specimen Description BLOOD LEFT HAND  Final   Special Requests IN PEDIATRIC BOTTLE Blood Culture adequate volume  Final   Culture NO GROWTH 1 DAY  Final   Report Status PENDING  Incomplete  Culture, blood (routine x 2)     Status: None (Preliminary result)   Collection Time: 12/24/16  6:34 AM  Result Value Ref Range Status   Specimen Description BLOOD RIGHT HAND  Final   Special Requests   Final    BOTTLES DRAWN AEROBIC ONLY Blood Culture adequate volume   Culture NO GROWTH 1 DAY  Final   Report Status PENDING  Incomplete  Aerobic/Anaerobic Culture (surgical/deep wound)     Status: None (Preliminary result)   Collection Time: 12/24/16  3:45 PM  Result Value Ref Range Status   Specimen Description ABSCESS LEFT PLEURAL  Final   Special  Requests Normal  Final   Gram Stain   Final    FEW WBC PRESENT,BOTH PMN AND MONONUCLEAR NO ORGANISMS SEEN    Culture NO GROWTH < 24 HOURS  Final   Report Status PENDING  Incomplete    Impression/Plan:  1. MRSA bacteremia - Repeat blood cultures ngtd.   Continue with daptomycin Vancomycin was ineffective due to low trough levels despite high doses  2. Lung nodules with empyema - on daptomycin for above and additional doxycycline for better lung penetration.  New chest tube placed by IR yesterday.   3.  Discitis with extensive epidural abscess - will need a prolonged course of daptomycin at discharge.    Will follow up intermittently

## 2016-12-25 NOTE — Progress Notes (Addendum)
      [address]       Keith Riggs,[place] [id number]             [phone]      Subjective:  Patient without new complaints.  Continues to have back discomfort.  He wonders why his fluid intake is being restricted.  Objective: Vital signs in last 24 hours: Temp:  [98.2 F (36.8 C)-99.8 F (37.7 C)] 98.2 F (36.8 C) (07/19 0445) Pulse Rate:  [102-108] 108 (07/18 1637) Cardiac Rhythm: Sinus tachycardia (07/19 0700) Resp:  [16-28] 16 (07/19 0445) BP: (106-135)/(54-72) 128/69 (07/19 0445) SpO2:  [88 %-97 %] 93 % (07/19 0445)  Intake/Output from previous day: 07/18 0701 - 07/19 0700 In: 110  Out: 2200 [Urine:2200]  General appearance: alert, cooperative and no distress Heart: regular rate and rhythm Lungs: diminished breath sounds bibasilar Abdomen: soft, non-tender; bowel sounds normal; no masses,  no organomegaly Extremities: extremities normal, atraumatic, no cyanosis or edema Wound: clean and dry  Lab Results:  Recent Labs  12/24/16 0634 12/25/16 0330  WBC 20.1* 20.2*  HGB 9.8* 9.6*  HCT 30.3* 29.9*  PLT 619* 580*   BMET:  Recent Labs  12/24/16 0634 12/25/16 0330  NA 126* 127*  K 4.0 4.0  CL 90* 91*  CO2 29 29  GLUCOSE 156* 180*  BUN <5* 5*  CREATININE 0.42* 0.48*  CALCIUM 8.5* 8.2*    PT/INR:  Recent Labs  12/23/16 0755  LABPROT 15.3*  INR 1.20   ABG No results found for: PHART, HCO3, TCO2, ACIDBASEDEF, O2SAT CBG (last 3)   Recent Labs  12/24/16 2307 12/25/16 0030 12/25/16 0443  GLUCAP 285* 228* 171*    Assessment/Plan:  1. S/P IR placement of left anterior chest tube- 150 cc serous fluid out since procedure- leave in place today 2. Posterior chest tube left- fluid remains purulent- output was not recorded yesterday ( level currently at 850) 3. ID- low grade temps yesterday, leukocytosis increased a little today up to 20.Marland Kitchen.on ABX per ID 4. Hyponatremia- fluid restricted by primary service, nephrology consult was  requested 5. Dispo- patient stable, leave chest tubes in place... CXR is free from pneumothorax, with reduction in anterior fluid collection post tube placement, continue ABX per ID, care per primary   LOS: 5 days    Keith Riggs, Keith Riggs 12/25/2016  Dg Chest Port 1 View  Result Date: 12/25/2016 CLINICAL DATA:  Status post chest tube placement EXAM: PORTABLE CHEST 1 VIEW COMPARISON:  12/24/2016 FINDINGS: Cardiac shadow remains enlarged. The lungs are well aerated bilaterally without focal infiltrate. Two left-sided chest tubes are now seen with significant reduction in the more superior loculated fluid collection. Only mild residual fluid remains. No new focal abnormality is noted. IMPRESSION: Significant improvement in left-sided fluid collection following drainage catheter placement. Electronically Signed   By: Keith CleverMark  Riggs M.D.   On: 12/25/2016 08:16   Chest xray improved this am, continue with current chest drainage  I have seen and examined Keith Riggs and agree with the above assessment  and plan.  Keith OvensEdward B Lauran Romanski MD Beeper [phone] Office [phone] 12/25/2016 8:57 AM

## 2016-12-25 NOTE — Progress Notes (Signed)
Admit: 12/19/2016 LOS: 5  [name] is a 26yo male with disseminated MRSA and consistently mildly low sodium since admission from which he is asymptomatic. Serum osm, urine osm, and urine Na consistent with SIADH.  Subjective:  Patient without complaints of headache, nausea, vomiting, dizziness, weakness. He has not restricted his fluid intake since coming back from chest tube placement yesterday.   07/18 0701 - 07/19 0700 In: 110  Out: 2200 [Urine:2200]  Filed Weights   12/19/16 2324  Weight: 237 lb (107.5 kg)    Scheduled Meds: . Chlorhexidine Gluconate Cloth  6 each Topical Q0600  . doxycycline  100 mg Oral Q12H  . enoxaparin (LOVENOX) injection  40 mg Subcutaneous Q24H  . insulin aspart  0-15 Units Subcutaneous Q4H  . insulin glargine  42 Units Subcutaneous Daily  . ketorolac  30 mg Intravenous Q8H  . mupirocin ointment  1 application Nasal BID  . nicotine  21 mg Transdermal Daily  . polyethylene glycol  17 g Oral Daily  . senna-docusate  2 tablet Oral BID   Continuous Infusions: . DAPTOmycin (CUBICIN)  IV Stopped (12/24/16 1229)   PRN Meds:.acetaminophen, bisacodyl, HYDROmorphone (DILAUDID) injection, ondansetron (ZOFRAN) IV, oxyCODONE-acetaminophen  Current Labs: reviewed - Na stable at 127, K 4.0, BUN 5, Cr 0.48, Hgb stable  Physical Exam:  Blood pressure 128/69, pulse (!) 108, temperature 98.2 F (36.8 C), temperature source Oral, resp. rate 16, height 5\' 11"  (1.803 m), weight 237 lb (107.5 kg), SpO2 93 %.  Constitutional: NAD CV: RRR, no murmurs, rubs or gallops Resp: CTAB on anterior chest Abd: soft, NDNT, +BS  A 1. Asymptomatic hypotonic, euvolemic hyponatremia - SIADH - Na stable today at 127; urine output at least 2L yesterday (no recordings of output overnight). Urine osm is downtrending - at 371 today. 2. Disseminated MRSA infection - has left lateral and anterior chest tubes for empyema  P 1. Follow daily Bmets, f/u daily urine osmolality  2. Fluid  restriction 1.5L. Discussed again with patient. 3. Rest per primary and consultants  Nyra MarketGorica Oksana Deberry MD PGY-2 12/25/2016, 10:37 AM   Recent Labs Lab 12/23/16 0401 12/24/16 0634 12/25/16 0330  NA 127* 126* 127*  K 3.9 4.0 4.0  CL 91* 90* 91*  CO2 26 29 29   GLUCOSE 196* 156* 180*  BUN <5* <5* 5*  CREATININE 0.51* 0.42* 0.48*  CALCIUM 8.3* 8.5* 8.2*    Recent Labs Lab 12/23/16 0401 12/24/16 0634 12/25/16 0330  WBC 19.7* 20.1* 20.2*  HGB 9.8* 9.8* 9.6*  HCT 30.0* 30.3* 29.9*  MCV 82.6 82.6 82.8  PLT 602* 619* 580*

## 2016-12-26 LAB — GLUCOSE, CAPILLARY
GLUCOSE-CAPILLARY: 137 mg/dL — AB (ref 65–99)
GLUCOSE-CAPILLARY: 230 mg/dL — AB (ref 65–99)
Glucose-Capillary: 246 mg/dL — ABNORMAL HIGH (ref 65–99)
Glucose-Capillary: 309 mg/dL — ABNORMAL HIGH (ref 65–99)

## 2016-12-26 LAB — BASIC METABOLIC PANEL
Anion gap: 9 (ref 5–15)
BUN: 6 mg/dL (ref 6–20)
CALCIUM: 8.4 mg/dL — AB (ref 8.9–10.3)
CO2: 27 mmol/L (ref 22–32)
Chloride: 96 mmol/L — ABNORMAL LOW (ref 101–111)
Creatinine, Ser: 0.48 mg/dL — ABNORMAL LOW (ref 0.61–1.24)
GFR calc non Af Amer: >60 mL/min (ref >60)
Glucose, Bld: 307 mg/dL — ABNORMAL HIGH (ref 65–99)
Potassium: 4.1 mmol/L (ref 3.5–5.1)
SODIUM: 132 mmol/L — AB (ref 135–145)

## 2016-12-26 LAB — OSMOLALITY, URINE: OSMOLALITY UR: 306 mosm/kg (ref 300–900)

## 2016-12-26 MED ORDER — INSULIN ASPART 100 UNIT/ML ~~LOC~~ SOLN
8.0000 [IU] | Freq: Three times a day (TID) | SUBCUTANEOUS | Status: DC
  Administered 2016-12-26 – 2016-12-29 (×8): 8 [IU] via SUBCUTANEOUS

## 2016-12-26 NOTE — Progress Notes (Signed)
Referring Physician(s): Dr Leodis Binet  Supervising Physician: Malachy Moan  Patient Status:  [facility] - In-pt  Chief Complaint:  Empyema s/p left lateral chest tube placement 7/14 and anterior chest tube placement 7/18  Subjective: Sleeping soundly.  Awakens to voice, but resistance to movement/turning due to back pain.   Allergies: Bee venom; Voltaren [diclofenac sodium]; and Latex  Medications: Prior to Admission medications   Medication Sig Start Date End Date Taking? Authorizing Provider  cephALEXin (KEFLEX) 500 MG capsule Take 500 mg by mouth 4 (four) times daily. 12/15/16  Yes [provider]  HYDROcodone-acetaminophen (NORCO/VICODIN) 5-325 MG tablet Take 1 tablet by mouth every 4 (four) hours as needed. for pain 12/17/16  Yes [provider]  Insulin Glargine (TOUJEO MAX SOLOSTAR) 300 UNIT/ML SOPN Inject 40 Units into the skin daily.   Yes [provider]  metFORMIN (GLUCOPHAGE) 500 MG tablet Take 500 mg by mouth 2 (two) times daily with a meal.   Yes [provider]     Vital Signs: BP (!) 128/59 (BP Location: Left Arm)   Pulse 85   Temp 97.8 F (36.6 C) (Oral)   Resp 15   Ht 5\' 11"  (1.803 m)   Wt 237 lb (107.5 kg)   SpO2 96%   BMI 33.05 kg/m   Physical Exam  Constitutional: He is oriented to person, place, and time.  Pulmonary/Chest: Effort normal. He has wheezes.  Lateral and anterior chest tube in place.  Sites clean/dry. Both tubes to suction currently, no air leak.   Musculoskeletal: Normal range of motion.  Neurological: He is alert and oriented to person, place, and time.  Skin: Skin is warm and dry.  Nursing note and vitals reviewed.   Imaging: Ct Chest W Contrast  Result Date: 12/23/2016 CLINICAL DATA:  Status post percutaneous catheter drainage of left chest empyema on 12/20/2016. Chest x-ray demonstrates probable undrained component of loculated empyema in the upper hemithorax. EXAM: CT CHEST WITH CONTRAST  TECHNIQUE: Multidetector CT imaging of the chest was performed during intravenous contrast administration. CONTRAST:  < 75 mL > ISOVUE-300 IOPAMIDOL (ISOVUE-300) INJECTION 61% COMPARISON:  CT of the chest on 12/20/2016 FINDINGS: Cardiovascular: Stable and normal heart size. Thoracic aorta and central pulmonary arteries are unremarkable. Trace pericardial fluid towards the base of the heart. Mediastinum/Nodes: Several borderline to mildly enlarged upper mediastinal lymph nodes again noted which are likely reactive and appear unchanged compared to the prior CT. Lungs/Pleura: Multiple partially cavitary nodules consistent with septic emboli scattered throughout both lungs appear grossly stable since the prior CT. Pigtail drainage catheter extends from the lateral chest wall into post a lateral empyema. This appears smaller in overall volume compared to the prior study with some fluid remaining as well as air in the loculated collection. Undrained loculated component of empyema along the superior and anterior hemithorax shows enlargement since the prior CT and now measures approximately 5.6 cm in greatest width compared to 4.6 cm at the same level 3 days ago. Undrained medial component of empyema adjacent to the mediastinum and extending to abut the pericardium and left heart border also appears enlarged. At the level of the superior aortic arch this now measures 2.6 cm in thickness compared to 1.5 cm previously. Basilar component abutting the pericardium near the left ventricle is also larger and measures approximately 5.6 cm in thickness compared to 3.9 cm previously. Small right-sided pleural effusion appears stable. Upper Abdomen: No acute abnormality. Musculoskeletal: Paraspinal inflammation and multiple small fluid collections in  the left posterior paraspinous musculature appear grossly stable. IMPRESSION: Decrease in size of the drained posterolateral left-sided empyema. Undrained loculated components along the  superior and anterior hemithorax as well as the medial hemithorax abutting the mediastinum and pericardium have enlarged since the prior scan. Multiple partially cavitary septic emboli scattered throughout both lungs are grossly stable. Paraspinal inflammation with multiple small paraspinal abscesses appear grossly stable. Electronically Signed   By: Irish Lack M.D.   On: 12/23/2016 16:29   Dg Chest Port 1 View  Result Date: 12/25/2016 CLINICAL DATA:  Status post chest tube placement EXAM: PORTABLE CHEST 1 VIEW COMPARISON:  12/24/2016 FINDINGS: Cardiac shadow remains enlarged. The lungs are well aerated bilaterally without focal infiltrate. Two left-sided chest tubes are now seen with significant reduction in the more superior loculated fluid collection. Only mild residual fluid remains. No new focal abnormality is noted. IMPRESSION: Significant improvement in left-sided fluid collection following drainage catheter placement. Electronically Signed   By: Alcide Clever M.D.   On: 12/25/2016 08:16   Dg Chest Port 1 View  Result Date: 12/24/2016 CLINICAL DATA:  Follow-up chest tube EXAM: PORTABLE CHEST 1 VIEW COMPARISON:  12/23/2016 FINDINGS: Cardiac shadow remains enlarged. A left-sided thoracostomy catheter is again identified and stable. The collection surrounding the drainage catheter appears to continue to decrease when compare with the prior exam. Loculated superior component is again identified and stable. The right lung remains clear. IMPRESSION: Stable loculated effusions superiorly on the left. Stable left drainage tube with overall decrease in more inferior collection. Electronically Signed   By: Alcide Clever M.D.   On: 12/24/2016 08:29   Dg Chest Port 1 View  Result Date: 12/23/2016 CLINICAL DATA:  Left-sided chest tube . EXAM: PORTABLE CHEST 1 VIEW COMPARISON:  12/22/2016. FINDINGS: Mediastinum is stable. Left chest tube in stable position. No focal infiltrate. Loculated left-sided pleural  effusion again noted without change from prior exam. No pneumothorax. Stable cardiomegaly. IMPRESSION: 1. Left-sided chest tube is in stable position. Stable loculated left pleural effusion. Chest is unchanged from prior exam. No pneumothorax. 2. Stable cardiomegaly . Electronically Signed   By: Maisie Fus  Register   On: 12/23/2016 07:24   Ct Image Guided Drainage By Percutaneous Catheter  Result Date: 12/24/2016 INDICATION: EMPYEMA, PERSISTENT LOCULATED LEFT PLEURAL COLLECTION EXAM: CT GUIDED DRAINAGE OF LEFT ANTERIOR EMPYEMA MEDICATIONS: The patient is currently admitted to the hospital and receiving intravenous antibiotics. The antibiotics were administered within an appropriate time frame prior to the initiation of the procedure. ANESTHESIA/SEDATION: 2.0 mg IV Versed 100 mcg IV Fentanyl Moderate Sedation Time:  15 MINUTES The patient was continuously monitored during the procedure by the interventional radiology nurse under my direct supervision. COMPLICATIONS: None immediate. TECHNIQUE: Informed written consent was obtained from the patient after a thorough discussion of the procedural risks, benefits and alternatives. All questions were addressed. Maximal Sterile Barrier Technique was utilized including caps, mask, sterile gowns, sterile gloves, sterile drape, hand hygiene and skin antiseptic. A timeout was performed prior to the initiation of the procedure. PROCEDURE: Previous imaging reviewed. Patient positioned supine. Noncontrast localization CT performed. The residual anterior loculated pleural fluid collection was localized. Overlying skin marked for an anterior approach through the second/third intercostal space. Under sterile conditions and local anesthesia, an 18 gauge access needle was advanced percutaneously under direct CT guidance into the fluid. Syringe aspiration yielded serosanguineous fluid. Sample sent for culture. Guidewire inserted followed by tract dilatation to advance a 12 [place] drain.  Retention loop formed in the collection and  confirmed with CT. Syringe aspiration yielded 30 cc fluid. Catheter secured with a Prolene suture and connected to Pleur-Evac externally. Sterile dressing applied. No immediate complication. Patient tolerated the procedure well. FINDINGS: Imaging confirms CT-guided needle placement into the anterior loculated left pleural effusion compatible with empyema for drain insertion. IMPRESSION: Successful CT-guided left anterior empyema drain insertion. Electronically Signed   By: Judie PetitM.  Shick M.D.   On: 12/24/2016 17:01    Labs:  CBC:  Recent Labs  12/22/16 0211 12/23/16 0401 12/24/16 0634 12/25/16 0330  WBC 25.6* 19.7* 20.1* 20.2*  HGB 10.9* 9.8* 9.8* 9.6*  HCT 33.6* 30.0* 30.3* 29.9*  PLT 610* 602* 619* 580*    COAGS:  Recent Labs  12/23/16 0755  INR 1.20    BMP:  Recent Labs  12/23/16 0401 12/24/16 0634 12/25/16 0330 12/26/16 0259  NA 127* 126* 127* 132*  K 3.9 4.0 4.0 4.1  CL 91* 90* 91* 96*  CO2 26 29 29 27   GLUCOSE 196* 156* 180* 307*  BUN <5* <5* 5* 6  CALCIUM 8.3* 8.5* 8.2* 8.4*  CREATININE 0.51* 0.42* 0.48* 0.48*  GFRNONAA >60 >60 >60 >60  GFRAA >60 >60 >60 >60    LIVER FUNCTION TESTS:  Recent Labs  12/20/16 0909  PROT 5.9*    Assessment and Plan: Empyema s/p left lateral chest tube placement 7/14, left anterior chest tube placement 7/18 CXR this AM shows significant improvement in left-sided fluid collection.  Output has decreased significantly.  Plans per primary and TCTS-- tubes to be placed to seal today and repeat CXR tomorrow.  WBC stable at 20K IR to follow.   Electronically Signed: Hoyt KochKacie Sue-Ellen Matthews, PA 12/26/2016, 10:02 AM   I spent a total of 15 Minutes at the the patient's bedside AND on the patient's hospital floor or unit, greater than 50% of which was counseling/coordinating care for left empyema chest tube drains

## 2016-12-26 NOTE — Progress Notes (Signed)
[facility] Progress Note  Patient: Keith Riggs ZOX:[id number]   PCP: System, Pcp Not In DOB: [birth year]   DOA: 12/19/2016   DOS: 12/26/2016   Date of Service: the patient was seen and examined on 12/26/2016  Subjective: Feeling better. More awake. No nausea no vomiting. Constipation has resolved.  Brief hospital course: 26 y/o with IDDM, past history of heroin abuse, cocaine abuse who get random drug tests 2 x a month. He is a transfer from [facility] ER. History obtained from Grandmother and father whom he lives with.  He was seen there for a right groin abscess on Wed which was I and D'd. He was seen again on Friday for a packing of the abscess and was found to have a left groin abscess with was I and D'd. He returned to there ER in the after noon because he thought his packing had fallen out. At that time, he complained of back pain and was sent to [facility] for and MRI. This showed thoracic epidural abscesses causing severe spinal cord compression, paraspinal myositis and abscesses, T6 osteo and b/l pleural effusions. Currently further plan is to monitor improvement in output from the chest tube..  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   Osteomyelitis of thoracic spine   Acute pyelonephritis   Soft tissue abscess of inguinal region- bilaterally  - lactic acid normal, Wbc 26, fever 100.3, tachycardia - Neurosurgery was consulted- recommend conservative management- cont Neuro checks every 4 hrs- re-call NS if any neurological changes - culture from wound on 7/11 is showing Strep Algactiae - blood culture from [facility] with gram + cocci in clusters - he has been on Keflex at home - MRSA PCR + - pleural fluid>> MRSA - blood culture 7/14 >> MRSA -  ID consulted  - 2 D ECHO negative for vegetation- no need for TEE as he will need a prolonged course of Vanc anyway - 7/16- Vanc trough < 4,  I discussed  this with ID requested Daptomycin which was started on 7/16  - 7/17- Doxy started for better lung penetration than Dapto - WBC counts - trending down    Left sided loculated effusion and septic pulmonary emboli - related to above  - strep pneumo neg - CT surgery consulted- not a VATS candidate at this time- recommended IR consult for pigtail catheter- IR consulted and drain placed on 7/14 - per CT surgery, he needs a second drain in left upper chest- IR consulted again, tolerated procedure , repeat chest x-ray shows improvement in collection volume. Plan is to maintain the tubes for today, repeat another chest x-ray tomorrow. No suction.  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  Hypokalemia/ hypomagnesemia - cont to replace and follow    DM2 (diabetes mellitus, type 2) - cont Lantus and SSI- dose of Lantus increased 7/15-  Adding scheduled before meals coverage.    IVDU (intravenous drug user) - has used IV Cocaine and Heroin in the past - states he has not used in 2-3 months - grandmother states he get random drug tests 2 x month - UDS shows Opiates which he is receiving int he hospital   Cigarette smoker - Nicotine patch    Normocytic anemia -  anemia >> Low Iron levels and normal Iron binding, retic count normal- Ferritin high likely due to infection - consistent with AOCD  Morbid Obesity  Body mass index is 33.05 kg/m.  Pain management. Patient was placed on scheduled OxyIR 6 times a day as well as on Dilaudid 0.5-1 mg every 2-4 hours as needed. On my assessment the patient is drowsy and sleepy and lethargic. At present I do not think the patient notes a scheduled narcotic medication. We change it to when necessary Percocet. Also changing Dilaudid from 0.5-1 mg 0.5 mg  every 4 hours when necessary. Placing the patient on scheduled Toradol as I believe he can tolerate that. Narcotic requirement has reduced significantly with adequate pain control.    Diet: regular diet DVT Prophylaxis: subcutaneous Heparin  Advance goals of care discussion: full code  Family Communication: no family was present at bedside, at the time of interview.   Disposition:  Discharge to be determined.  Consultants: ID, neurosurgery, CT surgery, nephrology, IR Procedures: IR guided drain placement, left chest tube 7/14, drainage catheter placement 7/18  Antibiotics: Anti-infectives    Start     Dose/Rate Route Frequency Ordered Stop   12/23/16 1230  doxycycline (VIBRA-TABS) tablet 100 mg     100 mg Oral Every 12 hours 12/23/16 1203     12/23/16 1200  DAPTOmycin (CUBICIN) 860 mg in sodium chloride 0.9 % IVPB     8 mg/kg  107.5 kg 234.4 mL/hr over 30 Minutes Intravenous Every 24 hours 12/22/16 1858     12/22/16 1230  DAPTOmycin (CUBICIN) 860 mg in sodium chloride 0.9 % IVPB     8 mg/kg  107.5 kg 234.4 mL/hr over 30 Minutes Intravenous Every 24 hours 12/22/16 1151 12/22/16 1337   12/21/16 2200  vancomycin (VANCOCIN) IVPB 1000 mg/200 mL premix  Status:  Discontinued     1,000 mg 200 mL/hr over 60 Minutes Intravenous Every 6 hours 12/21/16 2110 12/22/16 1850   12/21/16 2000  vancomycin (VANCOCIN) IVPB 1000 mg/200 mL premix  Status:  Discontinued     1,000 mg 200 mL/hr over 60 Minutes Intravenous Every 8 hours 12/21/16 1339 12/21/16 2109   12/20/16 1300  piperacillin-tazobactam (ZOSYN) IVPB 3.375 g  Status:  Discontinued     3.375 g 12.5 mL/hr over 240 Minutes Intravenous Every 8 hours 12/20/16 1109 12/20/16 1214   12/20/16 1300  cefTRIAXone (ROCEPHIN) 2 g in dextrose 5 % 50 mL IVPB  Status:  Discontinued     2 g 100 mL/hr over 30 Minutes Intravenous Every 24 hours 12/20/16 1214 12/22/16 2001   12/20/16 0200  vancomycin (VANCOCIN) IVPB 1000 mg/200 mL premix  Status:   Discontinued     1,000 mg 200 mL/hr over 60 Minutes Intravenous Every 8 hours 12/20/16 0024 12/21/16 1339   12/20/16 0200  piperacillin-tazobactam (ZOSYN) IVPB 3.375 g  Status:  Discontinued     3.375 g 12.5 mL/hr over 240 Minutes Intravenous Every 8 hours 12/20/16 0024 12/20/16 1109       Objective: Physical Exam: Vitals:   12/26/16 0019 12/26/16 0404 12/26/16 0827 12/26/16 1202  BP: 99/75 132/74 (!) 128/59 127/66  Pulse: 81 78 85 87  Resp: 13 (!) 22 15 20   Temp: 97.9 F (36.6 C) (!) 97.1 F (36.2 C) 97.8 F (36.6 C) 97.9 F (36.6 C)  TempSrc: Oral Oral Oral Oral  SpO2: 98% 98% 96% 97%  Weight:      Height:        Intake/Output Summary (Last 24  hours) at 12/26/16 1611 Last data filed at 12/26/16 1216  Gross per 24 hour  Intake            949.6 ml  Output             1295 ml  Net           -345.4 ml   Filed Weights   12/19/16 2324  Weight: 107.5 kg (237 lb)   General: drowsy and Oriented to Time, Place and Person. Appear in mild distress, affect appropriate Eyes: PERRL, Conjunctiva normal ENT: Oral Mucosa clear moist. Neck: no JVD, no Abnormal Mass Or lumps Cardiovascular: S1 and S2 Present, no Murmur, Peripheral Pulses Present Respiratory: normal respiratory effort, Bilateral Air entry equal and Decreased, no use of accessory muscle, Clear to Auscultation, no Crackles, no wheezes Abdomen: Bowel Sound present, Soft and no tenderness, no hernia Skin: no redness, no Rash, no induration Extremities: no Pedal edema, no calf tenderness Neurologic: Grossly no focal neuro deficit. Bilaterally Equal motor strength  Data Reviewed: CBC:  Recent Labs Lab 12/21/16 0242 12/22/16 0211 12/23/16 0401 12/24/16 0634 12/25/16 0330  WBC 26.0* 25.6* 19.7* 20.1* 20.2*  HGB 10.7* 10.9* 9.8* 9.8* 9.6*  HCT 32.9* 33.6* 30.0* 30.3* 29.9*  MCV 83.9 83.0 82.6 82.6 82.8  PLT 544* 610* 602* 619* 580*   Basic Metabolic Panel:  Recent Labs Lab 12/21/16 0840  12/22/16 0211  12/23/16 0401 12/24/16 0634 12/25/16 0330 12/26/16 0259  NA  --   < > 132* 127* 126* 127* 132*  K  --   < > 3.5 3.9 4.0 4.0 4.1  CL  --   < > 94* 91* 90* 91* 96*  CO2  --   < > 28 26 29 29 27   GLUCOSE  --   < > 193* 196* 156* 180* 307*  BUN  --   < > <5* <5* <5* 5* 6  CREATININE  --   < > 0.50* 0.51* 0.42* 0.48* 0.48*  CALCIUM  --   < > 8.2* 8.3* 8.5* 8.2* 8.4*  MG 1.6*  --   --   --   --   --   --   < > = values in this interval not displayed.  Liver Function Tests:  Recent Labs Lab 12/20/16 0909  PROT 5.9*   No results for input(s): LIPASE, AMYLASE in the last 168 hours. No results for input(s): AMMONIA in the last 168 hours. Coagulation Profile:  Recent Labs Lab 12/23/16 0755  INR 1.20   Cardiac Enzymes:  Recent Labs Lab 12/22/16 1212  CKTOTAL 20*   BNP (last 3 results) No results for input(s): PROBNP in the last 8760 hours. CBG:  Recent Labs Lab 12/25/16 1202 12/25/16 1634 12/25/16 2055 12/26/16 0556 12/26/16 1201  GLUCAP 321* 216* 310* 246* 309*   Studies: No results found.  Scheduled Meds: . Chlorhexidine Gluconate Cloth  6 each Topical Q0600  . doxycycline  100 mg Oral Q12H  . enoxaparin (LOVENOX) injection  40 mg Subcutaneous Q24H  . insulin aspart  0-15 Units Subcutaneous TID WC  . insulin aspart  8 Units Subcutaneous TID WC  . insulin glargine  42 Units Subcutaneous Daily  . ketorolac  30 mg Intravenous Q8H  . mupirocin ointment  1 application Nasal BID  . nicotine  21 mg Transdermal Daily  . polyethylene glycol  17 g Oral Daily  . senna-docusate  2 tablet Oral BID   Continuous Infusions: . DAPTOmycin (CUBICIN)  IV Stopped (  12/26/16 1246)   PRN Meds: acetaminophen, bisacodyl, HYDROmorphone (DILAUDID) injection, ondansetron (ZOFRAN) IV, oxyCODONE-acetaminophen  Time spent: 35 minutes  Author: Lynden Oxford, MD [facility] Pager: [phone] 12/26/2016 4:11 PM  If 7PM-7AM, please contact night-coverage at [email or web address],  password [facility]

## 2016-12-26 NOTE — Progress Notes (Signed)
Admit: 12/19/2016 LOS: 6  Keith Riggs is a 26yo male with disseminated MRSA and consistently mildly low sodium since admission from which he is asymptomatic. Serum osm, urine osm, and urine Na consistent with SIADH.  Subjective:  Patient states that he has decreased his fluid intake over last day. He denies nausea, vomiting, headaches, weakness.   07/19 0701 - 07/20 0700 In: 720 [P.O.:720] Out: 1545 [Urine:1350; Drains:195]  Filed Weights   12/19/16 2324  Weight: 237 lb (107.5 kg)    Scheduled Meds: . Chlorhexidine Gluconate Cloth  6 each Topical Q0600  . doxycycline  100 mg Oral Q12H  . enoxaparin (LOVENOX) injection  40 mg Subcutaneous Q24H  . insulin aspart  0-15 Units Subcutaneous TID WC  . insulin aspart  8 Units Subcutaneous TID WC  . insulin glargine  42 Units Subcutaneous Daily  . ketorolac  30 mg Intravenous Q8H  . mupirocin ointment  1 application Nasal BID  . nicotine  21 mg Transdermal Daily  . polyethylene glycol  17 g Oral Daily  . senna-docusate  2 tablet Oral BID   Continuous Infusions: . DAPTOmycin (CUBICIN)  IV Stopped (12/25/16 1233)   PRN Meds:.acetaminophen, bisacodyl, HYDROmorphone (DILAUDID) injection, ondansetron (ZOFRAN) IV, oxyCODONE-acetaminophen  Current Labs: reviewed - Na stable at 127, K 4.0, BUN 5, Cr 0.48, Hgb stable  Physical Exam:  Blood pressure (!) 128/59, pulse 85, temperature 97.8 F (36.6 C), temperature source Oral, resp. rate 15, height 5\' 11"  (1.803 m), weight 237 lb (107.5 kg), SpO2 96 %.  Constitutional: NAD CV: RRR, no murmurs, rubs or gallops, no LE edema Resp: CTAB on anterior chest Abd: soft, NDNT, +BS  A 1. Asymptomatic hypotonic, euvolemic hyponatremia - SIADH - Na improved to 132 this AM with fluid restriction; urine output stable. I expect SIADH to resolve with continued fluid restriction over next couple of days. 2. Disseminated MRSA infection - has left lateral and anterior chest tubes for empyema  P 1. Follow daily  Bmets 2. Continue fluid restriction 1.5L and SIADH should resolve within a few days then can resume normal intake 3. Rest per primary and consultants 4. Nephrology will sign off. Please let us know if we can be of further assistance.  Nyra MarketGorica Esiah Bazinet MD PGY-2 12/26/2016, 10:47 AM   Recent Labs Lab 12/24/16 0634 12/25/16 0330 12/26/16 0259  NA 126* 127* 132*  K 4.0 4.0 4.1  CL 90* 91* 96*  CO2 29 29 27   GLUCOSE 156* 180* 307*  BUN <5* 5* 6  CREATININE 0.42* 0.48* 0.48*  CALCIUM 8.5* 8.2* 8.4*    Recent Labs Lab 12/23/16 0401 12/24/16 0634 12/25/16 0330  WBC 19.7* 20.1* 20.2*  HGB 9.8* 9.8* 9.6*  HCT 30.0* 30.3* 29.9*  MCV 82.6 82.6 82.8  PLT 602* 619* 580*

## 2016-12-26 NOTE — Progress Notes (Addendum)
      [address]       [place] [id number]             [phone]        Subjective:  No new complaints.  Continues to have pain in back  Objective: Vital signs in last 24 hours: Temp:  [97.1 F (36.2 C)-98.5 F (36.9 C)] 97.1 F (36.2 C) (07/20 0404) Pulse Rate:  [78-102] 78 (07/20 0404) Cardiac Rhythm: Normal sinus rhythm (07/20 0708) Resp:  [13-22] 22 (07/20 0404) BP: (99-143)/(71-89) 132/74 (07/20 0404) SpO2:  [96 %-100 %] 98 % (07/20 0404)  Intake/Output from previous day: 07/19 0701 - 07/20 0700 In: 720 [P.O.:720] Out: 1545 [Urine:1350; Drains:195]  General appearance: alert, cooperative and no distress Heart: regular rate and rhythm Lungs: diminished breath sounds bibasilar Abdomen: soft, non-tender; bowel sounds normal; no masses,  no organomegaly Wound: clean and dry  Lab Results:  Recent Labs  12/24/16 0634 12/25/16 0330  WBC 20.1* 20.2*  HGB 9.8* 9.6*  HCT 30.3* 29.9*  PLT 619* 580*   BMET:  Recent Labs  12/25/16 0330 12/26/16 0259  NA 127* 132*  K 4.0 4.1  CL 91* 96*  CO2 29 27  GLUCOSE 180* 307*  BUN 5* 6  CREATININE 0.48* 0.48*  CALCIUM 8.2* 8.4*    PT/INR: No results for input(s): LABPROT, INR in the last 72 hours. ABG No results found for: PHART, HCO3, TCO2, ACIDBASEDEF, O2SAT CBG (last 3)   Recent Labs  12/25/16 1634 12/25/16 2055 12/26/16 0556  GLUCAP 216* 310* 246*    Assessment/Plan:  1. Left anterior chest tube- 0cc output in last 24 hours, fluid is serous... Okay to unhook chest tube to walk, will leave in place today 2. Posterior chest tube Left- drainage remains purulent 200 cc output since yesterday (level at 1050)- leave chest tube in place 3. ID- fevers have resolved, continue ABX per ID 4. Hyponatremia- improving, level is up to 132, recs per primary/nephrology 5. DM 6. Dispo- patient stable, continue chest tubes for now as drainage remains purulent in posterior chest tube, anterior with  minimal drainage... May be able to remove soon... Repeat CXR in AM, care per primary    LOS: 6 days    BARRETT, ERIN 12/26/2016  Check cbc Leave chest tubes for now  Follow up chest xray in am I have seen and examined Keith Riggs and agree with the above assessment  and plan.  Delight OvensEdward B Marcina Kinnison MD [facility] [phone] Office [phone] 12/26/2016 12:06 PM

## 2016-12-27 ENCOUNTER — Inpatient Hospital Stay (HOSPITAL_COMMUNITY): Payer: Self-pay

## 2016-12-27 LAB — GLUCOSE, CAPILLARY
GLUCOSE-CAPILLARY: 153 mg/dL — AB (ref 65–99)
Glucose-Capillary: 239 mg/dL — ABNORMAL HIGH (ref 65–99)
Glucose-Capillary: 219 mg/dL — ABNORMAL HIGH (ref 65–99)
Glucose-Capillary: 187 mg/dL — ABNORMAL HIGH (ref 65–99)

## 2016-12-27 LAB — BASIC METABOLIC PANEL
ANION GAP: 10 (ref 5–15)
BUN: 10 mg/dL (ref 6–20)
CALCIUM: 8.7 mg/dL — AB (ref 8.9–10.3)
CO2: 25 mmol/L (ref 22–32)
Chloride: 101 mmol/L (ref 101–111)
Creatinine, Ser: 0.44 mg/dL — ABNORMAL LOW (ref 0.61–1.24)
GFR calc Af Amer: >60 mL/min (ref >60)
GLUCOSE: 223 mg/dL — AB (ref 65–99)
POTASSIUM: 4.2 mmol/L (ref 3.5–5.1)
Sodium: 136 mmol/L (ref 135–145)

## 2016-12-27 LAB — HEMOGLOBIN A1C
HEMOGLOBIN A1C: 11.9 % — AB (ref 4.8–5.6)
MEAN PLASMA GLUCOSE: 295 mg/dL

## 2016-12-27 MED ORDER — INSULIN ASPART 100 UNIT/ML ~~LOC~~ SOLN
0.0000 [IU] | Freq: Three times a day (TID) | SUBCUTANEOUS | Status: DC
  Administered 2016-12-27: 7 [IU] via SUBCUTANEOUS
  Administered 2016-12-27 – 2016-12-28 (×2): 4 [IU] via SUBCUTANEOUS
  Administered 2016-12-28 – 2016-12-29 (×3): 3 [IU] via SUBCUTANEOUS

## 2016-12-27 MED ORDER — INSULIN ASPART 100 UNIT/ML ~~LOC~~ SOLN: 0.0000 [IU] | Freq: Every day | SUBCUTANEOUS | Status: DC

## 2016-12-27 MED ORDER — CYCLOBENZAPRINE HCL 10 MG PO TABS
5.0000 mg | ORAL_TABLET | Freq: Three times a day (TID) | ORAL | Status: DC
  Administered 2016-12-27 – 2017-01-06 (×32): 5 mg via ORAL
  Filled 2016-12-27 (×32): qty 1

## 2016-12-27 NOTE — Progress Notes (Signed)
12/27/2016 1400 Attempted again to change groin site dressings.  Pt refused, "I'm in too much pain right now" will let us know when he is ready.  Was able to change CT sites earlier. Kathryne HitchAllen, Willena Jeancharles C

## 2016-12-27 NOTE — Progress Notes (Signed)
[facility] Progress Note  Patient: Keith Riggs ZOX:[id number]   PCP: System, Pcp Not In DOB: 01/24/1991   DOA: 12/19/2016   DOS: 12/27/2016   Date of Service: the patient was seen and examined on 12/27/2016  Subjective: Continues to have the back pain. Nausea and vomiting.  Brief hospital course: 26 y/o with IDDM, past history of heroin abuse, cocaine abuse who get random drug tests 2 x a month. He is a [facility]. History obtained from Grandmother and father whom he lives with.  He was seen there for a right groin abscess on Wed which was I and D'd. He was seen again on Friday for a packing of the abscess and was found to have a left groin abscess with was I and D'd. He returned to there ER in the after noon because he thought his packing had fallen out. At that time, he complained of back pain and was sent [facility] and [facility]. This showed thoracic epidural abscesses causing severe spinal cord compression, paraspinal myositis and abscesses, T6 osteo and b/l pleural effusions. Currently further plan is to monitor improvement in output from the chest tube..  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   Osteomyelitis of thoracic spine   Acute pyelonephritis   Soft tissue abscess of inguinal region- bilaterally  - lactic acid normal, Wbc 26, fever 100.3, tachycardia - Neurosurgery was consulted- recommend conservative management- cont Neuro checks every 4 hrs- re-call NS if any neurological changes - culture from wound on 7/11 is showing Strep Algactiae - [facility] with gram + cocci in clusters - he has been on Keflex at home - MRSA PCR + - pleural fluid>> MRSA - blood culture 7/14 >> MRSA -  ID consulted  - 2 D ECHO negative for vegetation- no need for TEE as he will need a prolonged course of Vanc anyway - 7/16- Vanc trough < 4,  I discussed this with ID requested  Daptomycin which was started on 7/16  - 7/17- Doxy started for better lung penetration than Dapto - WBC counts - trending down    Left sided loculated effusion and septic pulmonary emboli - related to above  - strep pneumo neg - CT surgery consulted- not a VATS candidate at this time- recommended IR consult for pigtail catheter- IR consulted and drain placed on 7/14 - per CT surgery, he needs a second drain in left upper chest- IR consulted again, tolerated procedure , repeat chest x-ray shows improvement in collection volume. Plan is to maintain the tubes for today, repeat another chest x-ray tomorrow. No suction.  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 - Currently liberalizing fluid restriction.  Hypokalemia/ hypomagnesemia - cont to replace and follow    DM2 (diabetes mellitus, type 2) - cont Lantus and SSI- dose of Lantus increased 7/15-  Changing sliding scale to resistant at present    IVDU (intravenous drug user) - has used IV Cocaine and Heroin in the past - states he has not used in 2-3 months - grandmother states he get random drug tests 2 x month - UDS shows Opiates which he is receiving int he hospital   Cigarette smoker - Nicotine patch    Normocytic anemia - anemia >> Low Iron levels and normal Iron binding, retic count normal- Ferritin high likely  due to infection - consistent with AOCD  Morbid Obesity  Body mass index is 33.05 kg/m.  Pain management. Patient was placed on scheduled OxyIR 6 times a day as well as on Dilaudid 0.5-1 mg every 2-4 hours as needed. On my assessment the patient is drowsy and sleepy and lethargic. At present I do not think the patient notes a scheduled narcotic medication. We change it to when necessary Percocet. Also changing Dilaudid from 0.5-1 mg 0.5 mg every 4 hours when necessary. Placing the patient on scheduled Toradol as I believe he  can tolerate that. Narcotic requirement has reduced significantly with adequate pain control.   Patient continues to have some back pain. He is not using his narcotic medications as ordered recommended him to use more frequently to maintain pain tolerable.  Diet: regular diet DVT Prophylaxis: subcutaneous Heparin  Advance goals of care discussion: full code  Family Communication: family was present at bedside, at the time of interview.   Disposition:  Discharge to be determined.  Consultants: ID, neurosurgery, CT surgery, nephrology, IR Procedures: IR guided drain placement, left chest tube 7/14, drainage catheter placement 7/18  Antibiotics: Anti-infectives    Start     Dose/Rate Route Frequency Ordered Stop   12/23/16 1230  doxycycline (VIBRA-TABS) tablet 100 mg     100 mg Oral Every 12 hours 12/23/16 1203     12/23/16 1200  DAPTOmycin (CUBICIN) 860 mg in sodium chloride 0.9 % IVPB     8 mg/kg  107.5 kg 234.4 mL/hr over 30 Minutes Intravenous Every 24 hours 12/22/16 1858     12/22/16 1230  DAPTOmycin (CUBICIN) 860 mg in sodium chloride 0.9 % IVPB     8 mg/kg  107.5 kg 234.4 mL/hr over 30 Minutes Intravenous Every 24 hours 12/22/16 1151 12/22/16 1337   12/21/16 2200  vancomycin (VANCOCIN) IVPB 1000 mg/200 mL premix  Status:  Discontinued     1,000 mg 200 mL/hr over 60 Minutes Intravenous Every 6 hours 12/21/16 2110 12/22/16 1850   12/21/16 2000  vancomycin (VANCOCIN) IVPB 1000 mg/200 mL premix  Status:  Discontinued     1,000 mg 200 mL/hr over 60 Minutes Intravenous Every 8 hours 12/21/16 1339 12/21/16 2109   12/20/16 1300  piperacillin-tazobactam (ZOSYN) IVPB 3.375 g  Status:  Discontinued     3.375 g 12.5 mL/hr over 240 Minutes Intravenous Every 8 hours 12/20/16 1109 12/20/16 1214   12/20/16 1300  cefTRIAXone (ROCEPHIN) 2 g in dextrose 5 % 50 mL IVPB  Status:  Discontinued     2 g 100 mL/hr over 30 Minutes Intravenous Every 24 hours 12/20/16 1214 12/22/16 2001   12/20/16  0200  vancomycin (VANCOCIN) IVPB 1000 mg/200 mL premix  Status:  Discontinued     1,000 mg 200 mL/hr over 60 Minutes Intravenous Every 8 hours 12/20/16 0024 12/21/16 1339   12/20/16 0200  piperacillin-tazobactam (ZOSYN) IVPB 3.375 g  Status:  Discontinued     3.375 g 12.5 mL/hr over 240 Minutes Intravenous Every 8 hours 12/20/16 0024 12/20/16 1109       Objective: Physical Exam: Vitals:   12/27/16 0352 12/27/16 0825 12/27/16 1445 12/27/16 1615  BP: 124/79 134/79 (!) 141/66 132/78  Pulse: 85 91 83 90  Resp: (!) 0 20 15 (!) 22  Temp: 97.9 F (36.6 C) 98.8 F (37.1 C) 99.1 F (37.3 C) 99.1 F (37.3 C)  TempSrc: Oral Oral Oral Oral  SpO2: 96% 96% 98% 95%  Weight:      Height:  Intake/Output Summary (Last 24 hours) at 12/27/16 1948 Last data filed at 12/27/16 1800  Gross per 24 hour  Intake              960 ml  Output              950 ml  Net               10 ml   Filed Weights   12/19/16 2324  Weight: 107.5 kg (237 lb)   General: drowsy and Oriented to Time, Place and Person. Appear in mild distress, affect appropriate Eyes: PERRL, Conjunctiva normal ENT: Oral Mucosa clear moist. Neck: no JVD, no Abnormal Mass Or lumps Cardiovascular: S1 and S2 Present, no Murmur, Peripheral Pulses Present Respiratory: normal respiratory effort, Bilateral Air entry equal and Decreased, no use of accessory muscle, Clear to Auscultation, no Crackles, no wheezes Abdomen: Bowel Sound present, Soft and no tenderness, no hernia Skin: no redness, no Rash, no induration Extremities: no Pedal edema, no calf tenderness Neurologic: Grossly no focal neuro deficit. Bilaterally Equal motor strength  Data Reviewed: CBC:  Recent Labs Lab 12/21/16 0242 12/22/16 0211 12/23/16 0401 12/24/16 0634 12/25/16 0330  WBC 26.0* 25.6* 19.7* 20.1* 20.2*  HGB 10.7* 10.9* 9.8* 9.8* 9.6*  HCT 32.9* 33.6* 30.0* 30.3* 29.9*  MCV 83.9 83.0 82.6 82.6 82.8  PLT 544* 610* 602* 619* 580*   Basic Metabolic  Panel:  Recent Labs Lab 12/21/16 0840  12/23/16 0401 12/24/16 0634 12/25/16 0330 12/26/16 0259 12/27/16 0234  NA  --   < > 127* 126* 127* 132* 136  K  --   < > 3.9 4.0 4.0 4.1 4.2  CL  --   < > 91* 90* 91* 96* 101  CO2  --   < > 26 29 29 27 25   GLUCOSE  --   < > 196* 156* 180* 307* 223*  BUN  --   < > <5* <5* 5* 6 10  CREATININE  --   < > 0.51* 0.42* 0.48* 0.48* 0.44*  CALCIUM  --   < > 8.3* 8.5* 8.2* 8.4* 8.7*  MG 1.6*  --   --   --   --   --   --   < > = values in this interval not displayed.  Liver Function Tests: No results for input(s): AST, ALT, ALKPHOS, BILITOT, PROT, ALBUMIN in the last 168 hours. No results for input(s): LIPASE, AMYLASE in the last 168 hours. No results for input(s): AMMONIA in the last 168 hours. Coagulation Profile:  Recent Labs Lab 12/23/16 0755  INR 1.20   Cardiac Enzymes:  Recent Labs Lab 12/22/16 1212  CKTOTAL 20*   BNP (last 3 results) No results for input(s): PROBNP in the last 8760 hours. CBG:  Recent Labs Lab 12/26/16 1628 12/26/16 2052 12/27/16 0553 12/27/16 1133 12/27/16 1611  GLUCAP 137* 230* 239* 219* 187*   Studies: Dg Chest Port 1 View  Result Date: 12/27/2016 CLINICAL DATA:  Empyema EXAM: PORTABLE CHEST 1 VIEW COMPARISON:  Two days ago FINDINGS: Two left-sided pigtail catheters. The more superior catheter is more inferiorly positioned today. Small volume left chest with unchanged loculation at the apex. Right chest remains clear. Heart size is accentuated by loculations along the left heart border that appear diminished from prior chest x-ray. IMPRESSION: 1. Apparent decrease in pleural collections along the left heart border. 2. Stable loculated pleural thickening and fluid at the left apex. An associated pleural catheter is more inferiorly  positioned today. Electronically Signed   By: Marnee Spring M.D.   On: 12/27/2016 08:24    Scheduled Meds: . cyclobenzaprine  5 mg Oral TID  . doxycycline  100 mg Oral Q12H    . enoxaparin (LOVENOX) injection  40 mg Subcutaneous Q24H  . insulin aspart  0-20 Units Subcutaneous TID WC  . insulin aspart  0-5 Units Subcutaneous QHS  . insulin aspart  8 Units Subcutaneous TID WC  . insulin glargine  42 Units Subcutaneous Daily  . ketorolac  30 mg Intravenous Q8H  . nicotine  21 mg Transdermal Daily  . polyethylene glycol  17 g Oral Daily  . senna-docusate  2 tablet Oral BID   Continuous Infusions: . DAPTOmycin (CUBICIN)  IV Stopped (12/27/16 1507)   PRN Meds: acetaminophen, bisacodyl, HYDROmorphone (DILAUDID) injection, ondansetron (ZOFRAN) IV, oxyCODONE-acetaminophen  Time spent: 35 minutes  Author: Lynden Oxford, MD [facility] Pager: [phone] 12/27/2016 7:47 PM  If 7PM-7AM, please contact night-coverage at [email or web address], password [facility]

## 2016-12-27 NOTE — Progress Notes (Signed)
Referring Physician(s): Dr Leodis BinetP Patel  Supervising Physician: Malachy MoanMcCullough, Heath  Patient [facility]  [facility]  Chief Complaint:  Empyema s/p left lateral chest tube placement 7/14 and anterior chest tube placement 7/18  Subjective: Sleeping soundly.  Awakens to voice, but resistance to movement/turning due to back pain.   Allergies: Bee venom; Voltaren [diclofenac sodium]; and Latex  Medications: Prior to Admission medications   Medication Sig Start Date End Date Taking? Authorizing Provider  cephALEXin (KEFLEX) 500 MG capsule Take 500 mg by mouth 4 (four) times daily. 12/15/16  Yes [provider]  HYDROcodone-acetaminophen (NORCO/VICODIN) 5-325 MG tablet Take 1 tablet by mouth every 4 (four) hours as needed. for pain 12/17/16  Yes [provider]  Insulin Glargine (TOUJEO MAX SOLOSTAR) 300 UNIT/ML SOPN Inject 40 Units into the skin daily.   Yes [provider]  metFORMIN (GLUCOPHAGE) 500 MG tablet Take 500 mg by mouth 2 (two) times daily with a meal.   Yes [provider]     Vital Signs: BP 134/79 (BP Location: Right Arm)   Pulse 91   Temp 98.8 F (37.1 C) (Oral)   Resp 20   Ht 5\' 11"  (1.803 m)   Wt 237 lb (107.5 kg)   SpO2 96%   BMI 33.05 kg/m   Physical Exam  Constitutional: He is oriented to person, place, and time.  Pulmonary/Chest: Effort normal. He has no wheezes.  Left Lateral and anterior chest tube in place.  Sites clean/dry, dressings recently changed.   Musculoskeletal: Normal range of motion.  Neurological: He is alert and oriented to person, place, and time.  Skin: Skin is warm and dry.  Nursing note and vitals reviewed.   Imaging: Dg Chest Port 1 View  Result Date: 12/27/2016 CLINICAL DATA:  Empyema EXAM: PORTABLE CHEST 1 VIEW COMPARISON:  Two days ago FINDINGS: Two left-sided pigtail catheters. The more superior catheter is more inferiorly positioned today. Small volume left chest with unchanged loculation at the  apex. Right chest remains clear. Heart size is accentuated by loculations along the left heart border that appear diminished from prior chest x-ray. IMPRESSION: 1. Apparent decrease in pleural collections along the left heart border. 2. Stable loculated pleural thickening and fluid at the left apex. An associated pleural catheter is more inferiorly positioned today. Electronically Signed   By: Marnee SpringJonathon  Watts M.D.   On: 12/27/2016 08:24   Dg Chest Port 1 View  Result Date: 12/25/2016 CLINICAL DATA:  Status post chest tube placement EXAM: PORTABLE CHEST 1 VIEW COMPARISON:  12/24/2016 FINDINGS: Cardiac shadow remains enlarged. The lungs are well aerated bilaterally without focal infiltrate. Two left-sided chest tubes are now seen with significant reduction in the more superior loculated fluid collection. Only mild residual fluid remains. No new focal abnormality is noted. IMPRESSION: Significant improvement in left-sided fluid collection following drainage catheter placement. Electronically Signed   By: Alcide CleverMark  Lukens M.D.   On: 12/25/2016 08:16   Dg Chest Port 1 View  Result Date: 12/24/2016 CLINICAL DATA:  Follow-up chest tube EXAM: PORTABLE CHEST 1 VIEW COMPARISON:  12/23/2016 FINDINGS: Cardiac shadow remains enlarged. A left-sided thoracostomy catheter is again identified and stable. The collection surrounding the drainage catheter appears to continue to decrease when compare with the prior exam. Loculated superior component is again identified and stable. The right lung remains clear. IMPRESSION: Stable loculated effusions superiorly on the left. Stable left drainage tube with overall decrease in more inferior collection. Electronically Signed   By: Loraine LericheMark  Lukens M.D.   On: 12/24/2016 08:29   Ct Image Guided Drainage By Percutaneous Catheter  Result Date: 12/24/2016 INDICATION: EMPYEMA, PERSISTENT LOCULATED LEFT PLEURAL COLLECTION EXAM: CT GUIDED DRAINAGE OF LEFT ANTERIOR EMPYEMA MEDICATIONS: The patient  is currently admitted to the hospital and receiving intravenous antibiotics. The antibiotics were administered within an appropriate time frame prior to the initiation of the procedure. ANESTHESIA/SEDATION: 2.0 mg IV Versed 100 mcg IV Fentanyl Moderate Sedation Time:  15 MINUTES The patient was continuously monitored during the procedure by the interventional radiology nurse under my direct supervision. COMPLICATIONS: None immediate. TECHNIQUE: Informed written consent was obtained from the patient after a thorough discussion of the procedural risks, benefits and alternatives. All questions were addressed. Maximal Sterile Barrier Technique was utilized including caps, mask, sterile gowns, sterile gloves, sterile drape, hand hygiene and skin antiseptic. A timeout was performed prior to the initiation of the procedure. PROCEDURE: Previous imaging reviewed. Patient positioned supine. Noncontrast localization CT performed. The residual anterior loculated pleural fluid collection was localized. Overlying skin marked for an anterior approach through the second/third intercostal space. Under sterile conditions and local anesthesia, an 18 gauge access needle was advanced percutaneously under direct CT guidance into the fluid. Syringe aspiration yielded serosanguineous fluid. Sample sent for culture. Guidewire inserted followed by tract dilatation to advance a 12 [place] drain. Retention loop formed in the collection and confirmed with CT. Syringe aspiration yielded 30 cc fluid. Catheter secured with a Prolene suture and connected to Pleur-Evac externally. Sterile dressing applied. No immediate complication. Patient tolerated the procedure well. FINDINGS: Imaging confirms CT-guided needle placement into the anterior loculated left pleural effusion compatible with empyema for drain insertion. IMPRESSION: Successful CT-guided left anterior empyema drain insertion. Electronically Signed   By: Judie Petit.  Shick M.D.   On: 12/24/2016  17:01    Labs:  CBC:  Recent Labs  12/22/16 0211 12/23/16 0401 12/24/16 0634 12/25/16 0330  WBC 25.6* 19.7* 20.1* 20.2*  HGB 10.9* 9.8* 9.8* 9.6*  HCT 33.6* 30.0* 30.3* 29.9*  PLT 610* 602* 619* 580*    COAGS:  Recent Labs  12/23/16 0755  INR 1.20    BMP:  Recent Labs  12/24/16 0634 12/25/16 0330 12/26/16 0259 12/27/16 0234  NA 126* 127* 132* 136  K 4.0 4.0 4.1 4.2  CL 90* 91* 96* 101  CO2 29 29 27 25   GLUCOSE 156* 180* 307* 223*  BUN <5* 5* 6 10  CALCIUM 8.5* 8.2* 8.4* 8.7*  CREATININE 0.42* 0.48* 0.48* 0.44*  GFRNONAA >60 >60 >60 >60  GFRAA >60 >60 >60 >60    LIVER FUNCTION TESTS:  Recent Labs  12/20/16 0909  PROT 5.9*    Assessment and Plan: Empyema s/p left lateral chest tube placement 7/14, left anterior chest tube placement 7/18 CXR show improved in pleural collection.  + output overnight. Per TCTS, continue tube for now. IR to follow.   Electronically Signed: Hoyt Koch, PA 12/27/2016, 2:37 PM   I spent a total of 15 Minutes at the the patient's bedside AND on the patient's hospital floor or unit, greater than 50% of which was counseling/coordinating care for left empyema chest tube drains

## 2016-12-27 NOTE — Progress Notes (Addendum)
[address]       [name] [id number]             [phone]         Subjective: C/o pain from tubes and in back  Objective: Vital signs in last 24 hours: Temp:  [97.9 F (36.6 C)-98.7 F (37.1 C)] 97.9 F (36.6 C) (07/21 0352) Pulse Rate:  [85-103] 91 (07/21 0825) Cardiac Rhythm: Normal sinus rhythm (07/21 0716) Resp:  [0-26] 20 (07/21 0825) BP: (124-141)/(66-80) 134/79 (07/21 0825) SpO2:  [96 %-98 %] 96 % (07/21 0825)  Hemodynamic parameters for last 24 hours:    Intake/Output from previous day: 07/20 0701 - 07/21 0700 In: 1305.6 [P.O.:954; IV Piggyback:351.6] Out: 1430 [Urine:1200; Drains:230] Intake/Output this shift: No intake/output data recorded.  General appearance: alert, cooperative and clearly uncomfortable Heart: regular rate and rhythm and occ extrasystole Lungs: dim left base Abdomen: soft, non-tender Extremities: warm /well perfused  Lab Results:  Recent Labs  12/25/16 0330  WBC 20.2*  HGB 9.6*  HCT 29.9*  PLT 580*   BMET:  Recent Labs  12/26/16 0259 12/27/16 0234  NA 132* 136  K 4.1 4.2  CL 96* 101  CO2 27 25  GLUCOSE 307* 223*  BUN 6 10  CREATININE 0.48* 0.44*  CALCIUM 8.4* 8.7*    PT/INR: No results for input(s): LABPROT, INR in the last 72 hours. ABG No results found for: PHART, HCO3, TCO2, ACIDBASEDEF, O2SAT CBG (last 3)   Recent Labs  12/26/16 1628 12/26/16 2052 12/27/16 0553  GLUCAP 137* 230* 239*    Meds Scheduled Meds: . Chlorhexidine Gluconate Cloth  6 each Topical Q0600  . doxycycline  100 mg Oral Q12H  . enoxaparin (LOVENOX) injection  40 mg Subcutaneous Q24H  . insulin aspart  0-15 Units Subcutaneous TID WC  . insulin aspart  8 Units Subcutaneous TID WC  . insulin glargine  42 Units Subcutaneous Daily  . ketorolac  30 mg Intravenous Q8H  . mupirocin ointment  1 application Nasal BID  . nicotine  21 mg Transdermal Daily  . polyethylene glycol  17 g Oral Daily  . senna-docusate   2 tablet Oral BID   Continuous Infusions: . DAPTOmycin (CUBICIN)  IV Stopped (12/26/16 1246)   PRN Meds:.acetaminophen, bisacodyl, HYDROmorphone (DILAUDID) injection, ondansetron (ZOFRAN) IV, oxyCODONE-acetaminophen  Xrays Dg Chest Port 1 View  Result Date: 12/27/2016 CLINICAL DATA:  Empyema EXAM: PORTABLE CHEST 1 VIEW COMPARISON:  Two days ago FINDINGS: Two left-sided pigtail catheters. The more superior catheter is more inferiorly positioned today. Small volume left chest with unchanged loculation at the apex. Right chest remains clear. Heart size is accentuated by loculations along the left heart border that appear diminished from prior chest x-ray. IMPRESSION: 1. Apparent decrease in pleural collections along the left heart border. 2. Stable loculated pleural thickening and fluid at the left apex. An associated pleural catheter is more inferiorly positioned today. Electronically Signed   By: Marnee SpringJonathon  Watts M.D.   On: 12/27/2016 08:24     Results for orders placed or performed during the hospital encounter of 12/19/16  Blood culture (routine x 2)     Status: Abnormal   Collection Time: 12/20/16 12:01 AM  Result Value Ref Range Status   Specimen Description BLOOD RIGHT HAND  Final   Special Requests   Final    BOTTLES DRAWN AEROBIC AND ANAEROBIC Blood Culture adequate volume   Culture  Setup Time   Final  GRAM POSITIVE COCCI IN CLUSTERS ANAEROBIC BOTTLE ONLY CRITICAL RESULT CALLED TO, READ BACK BY AND VERIFIED WITH: K. COOK PHARMD, AT 0701 12/22/16 BY D.VANHOOK    Culture (A)  Final    STAPHYLOCOCCUS AUREUS SUSCEPTIBILITIES PERFORMED ON PREVIOUS CULTURE WITHIN THE LAST 5 DAYS.    Report Status 12/24/2016 FINAL  Final  Blood culture (routine x 2)     Status: Abnormal   Collection Time: 12/20/16 12:20 AM  Result Value Ref Range Status   Specimen Description BLOOD LEFT ANTECUBITAL  Final   Special Requests   Final    BOTTLES DRAWN AEROBIC AND ANAEROBIC Blood Culture adequate  volume   Culture  Setup Time   Final    GRAM POSITIVE COCCI IN CLUSTERS ANAEROBIC BOTTLE ONLY CRITICAL RESULT CALLED TO, READ BACK BY AND VERIFIED WITH: Patrick North, PHARMD 2111 12/21/2016 T. TYSOR    Culture METHICILLIN RESISTANT STAPHYLOCOCCUS AUREUS (A)  Final   Report Status 12/23/2016 FINAL  Final   Organism ID, Bacteria METHICILLIN RESISTANT STAPHYLOCOCCUS AUREUS  Final      Susceptibility   Methicillin resistant staphylococcus aureus - MIC*    CIPROFLOXACIN >=8 RESISTANT Resistant     ERYTHROMYCIN <=0.25 SENSITIVE Sensitive     GENTAMICIN <=0.5 SENSITIVE Sensitive     OXACILLIN >=4 RESISTANT Resistant     TETRACYCLINE <=1 SENSITIVE Sensitive     VANCOMYCIN <=0.5 SENSITIVE Sensitive     TRIMETH/SULFA <=10 SENSITIVE Sensitive     CLINDAMYCIN <=0.25 SENSITIVE Sensitive     RIFAMPIN <=0.5 SENSITIVE Sensitive     Inducible Clindamycin NEGATIVE Sensitive     * METHICILLIN RESISTANT STAPHYLOCOCCUS AUREUS  Blood Culture ID Panel (Reflexed)     Status: Abnormal   Collection Time: 12/20/16 12:20 AM  Result Value Ref Range Status   Enterococcus species NOT DETECTED NOT DETECTED Final   Listeria monocytogenes NOT DETECTED NOT DETECTED Final   Staphylococcus species DETECTED (A) NOT DETECTED Final    Comment: CRITICAL RESULT CALLED TO, READ BACK BY AND VERIFIED WITH: Patrick North, PHARMD 2111 12/21/2016 T. TYSOR    Staphylococcus aureus DETECTED (A) NOT DETECTED Final    Comment: Methicillin (oxacillin)-resistant Staphylococcus aureus (MRSA). MRSA is predictably resistant to beta-lactam antibiotics (except ceftaroline). Preferred therapy is vancomycin unless clinically contraindicated. Patient requires contact precautions if  hospitalized. CRITICAL RESULT CALLED TO, READ BACK BY AND VERIFIED WITH: Patrick North, PHARMD 2111 12/21/2016 T. TYSOR    Methicillin resistance DETECTED (A) NOT DETECTED Final    Comment: CRITICAL RESULT CALLED TO, READ BACK BY AND VERIFIED WITH: Patrick North, PHARMD 2111 12/21/2016 T. TYSOR    Streptococcus species NOT DETECTED NOT DETECTED Final   Streptococcus agalactiae NOT DETECTED NOT DETECTED Final   Streptococcus pneumoniae NOT DETECTED NOT DETECTED Final   Streptococcus pyogenes NOT DETECTED NOT DETECTED Final   Acinetobacter baumannii NOT DETECTED NOT DETECTED Final   Enterobacteriaceae species NOT DETECTED NOT DETECTED Final   Enterobacter cloacae complex NOT DETECTED NOT DETECTED Final   Escherichia coli NOT DETECTED NOT DETECTED Final   Klebsiella oxytoca NOT DETECTED NOT DETECTED Final   Klebsiella pneumoniae NOT DETECTED NOT DETECTED Final   Proteus species NOT DETECTED NOT DETECTED Final   Serratia marcescens NOT DETECTED NOT DETECTED Final   Haemophilus influenzae NOT DETECTED NOT DETECTED Final   Neisseria meningitidis NOT DETECTED NOT DETECTED Final   Pseudomonas aeruginosa NOT DETECTED NOT DETECTED Final   Candida albicans NOT DETECTED NOT DETECTED Final   Candida glabrata NOT DETECTED NOT DETECTED Final  Candida krusei NOT DETECTED NOT DETECTED Final   Candida parapsilosis NOT DETECTED NOT DETECTED Final   Candida tropicalis NOT DETECTED NOT DETECTED Final  Aerobic/Anaerobic Culture (surgical/deep wound)     Status: None   Collection Time: 12/20/16  4:34 PM  Result Value Ref Range Status   Specimen Description WOUND PLEURAL LEFT  Final   Special Requests Normal  Final   Gram Stain   Final    ABUNDANT WBC PRESENT, PREDOMINANTLY PMN MODERATE GRAM POSITIVE COCCI IN CLUSTERS    Culture   Final    ABUNDANT METHICILLIN RESISTANT STAPHYLOCOCCUS AUREUS NO ANAEROBES ISOLATED    Report Status 12/25/2016 FINAL  Final   Organism ID, Bacteria METHICILLIN RESISTANT STAPHYLOCOCCUS AUREUS  Final      Susceptibility   Methicillin resistant staphylococcus aureus - MIC*    CIPROFLOXACIN >=8 RESISTANT Resistant     ERYTHROMYCIN <=0.25 SENSITIVE Sensitive     GENTAMICIN <=0.5 SENSITIVE Sensitive     OXACILLIN >=4  RESISTANT Resistant     TETRACYCLINE <=1 SENSITIVE Sensitive     VANCOMYCIN 1 SENSITIVE Sensitive     TRIMETH/SULFA <=10 SENSITIVE Sensitive     CLINDAMYCIN <=0.25 SENSITIVE Sensitive     RIFAMPIN <=0.5 SENSITIVE Sensitive     Inducible Clindamycin NEGATIVE Sensitive     * ABUNDANT METHICILLIN RESISTANT STAPHYLOCOCCUS AUREUS  MRSA PCR Screening     Status: Abnormal   Collection Time: 12/21/16  7:59 AM  Result Value Ref Range Status   MRSA by PCR POSITIVE (A) NEGATIVE Final    Comment:        The GeneXpert MRSA Assay (FDA approved for NASAL specimens only), is one component of a comprehensive MRSA colonization surveillance program. It is not intended to diagnose MRSA infection nor to guide or monitor treatment for MRSA infections. RESULT CALLED TO, READ BACK BY AND VERIFIED WITH: T.MAINIERL RN AT 1025 12/21/16 BY A.DAVIS   Culture, blood (routine x 2)     Status: None (Preliminary result)   Collection Time: 12/24/16  6:34 AM  Result Value Ref Range Status   Specimen Description BLOOD LEFT HAND  Final   Special Requests IN PEDIATRIC BOTTLE Blood Culture adequate volume  Final   Culture NO GROWTH 2 DAYS  Final   Report Status PENDING  Incomplete  Culture, blood (routine x 2)     Status: None (Preliminary result)   Collection Time: 12/24/16  6:34 AM  Result Value Ref Range Status   Specimen Description BLOOD RIGHT HAND  Final   Special Requests   Final    BOTTLES DRAWN AEROBIC ONLY Blood Culture adequate volume   Culture NO GROWTH 2 DAYS  Final   Report Status PENDING  Incomplete  Aerobic/Anaerobic Culture (surgical/deep wound)     Status: None (Preliminary result)   Collection Time: 12/24/16  3:45 PM  Result Value Ref Range Status   Specimen Description ABSCESS LEFT PLEURAL  Final   Special Requests Normal  Final   Gram Stain   Final    FEW WBC PRESENT,BOTH PMN AND MONONUCLEAR NO ORGANISMS SEEN    Culture   Final    NO GROWTH 2 DAYS NO ANAEROBES ISOLATED; CULTURE IN  PROGRESS FOR 5 DAYS   Report Status PENDING  Incomplete   Assessment/Plan:  1 chest tubes with mod drainage, 425 cc yesterday,  more  serous than purulent currently, no air leak , CXR appearance slowly improving- leave in place 2 hemodyn stable 3 sugars poorly controlled, management per primary 4 no  new CBC today, ID managing abx recs, MRSA, 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  Purcell Nails, MD 12/27/2016 11:06 AM

## 2016-12-28 LAB — GLUCOSE, CAPILLARY
GLUCOSE-CAPILLARY: 123 mg/dL — AB (ref 65–99)
GLUCOSE-CAPILLARY: 167 mg/dL — AB (ref 65–99)
GLUCOSE-CAPILLARY: 146 mg/dL — AB (ref 65–99)
Glucose-Capillary: 109 mg/dL — ABNORMAL HIGH (ref 65–99)

## 2016-12-28 LAB — BASIC METABOLIC PANEL
ANION GAP: 8 (ref 5–15)
BUN: 8 mg/dL (ref 6–20)
CHLORIDE: 101 mmol/L (ref 101–111)
CO2: 27 mmol/L (ref 22–32)
Calcium: 9 mg/dL (ref 8.9–10.3)
Creatinine, Ser: 0.45 mg/dL — ABNORMAL LOW (ref 0.61–1.24)
GFR calc Af Amer: >60 mL/min (ref >60)
GFR calc non Af Amer: >60 mL/min (ref >60)
GLUCOSE: 160 mg/dL — AB (ref 65–99)
POTASSIUM: 4.4 mmol/L (ref 3.5–5.1)
Sodium: 136 mmol/L (ref 135–145)

## 2016-12-28 LAB — OSMOLALITY, URINE: Osmolality, Ur: 657 mOsm/kg (ref 300–900)

## 2016-12-28 MED ORDER — ZOLPIDEM TARTRATE 5 MG PO TABS
5.0000 mg | ORAL_TABLET | Freq: Every evening | ORAL | Status: DC | PRN
  Administered 2016-12-30 – 2017-01-02 (×2): 5 mg via ORAL
  Filled 2016-12-28 (×2): qty 1

## 2016-12-28 MED ORDER — INSULIN GLARGINE 100 UNIT/ML ~~LOC~~ SOLN
50.0000 [IU] | Freq: Every day | SUBCUTANEOUS | Status: DC
  Administered 2016-12-28 – 2017-01-06 (×10): 50 [IU] via SUBCUTANEOUS
  Filled 2016-12-28 (×10): qty 0.5

## 2016-12-28 NOTE — Progress Notes (Signed)
ANTIBIOTIC CONSULT NOTE - INITIAL  Pharmacy Consult for Daptomycin Indication: Multiple abscesses  Allergies  Allergen Reactions  . Bee Venom Anaphylaxis  . Voltaren [Diclofenac Sodium] Other (See Comments)    "Makes me feel not myself"  . Latex Rash    Patient Measurements: Height: 5\' 11"  (180.3 cm) Weight: 242 lb 3.2 oz (109.9 kg) IBW/kg (Calculated) : 75.3 Adjusted Body Weight:   Vital Signs: Temp: 99.2 F (37.3 C) (07/22 0300) Temp Source: Oral (07/22 0300) BP: 140/92 (07/22 0300) Pulse Rate: 76 (07/22 0300) Intake/Output from previous day: 07/21 0701 - 07/22 0700 In: 960 [P.O.:960] Out: 2155 [Urine:2125; Drains:30] Intake/Output from this shift: No intake/output data recorded.  Labs:  Recent Labs  12/26/16 0259 12/27/16 0234 12/28/16 0204  CREATININE 0.48* 0.44* 0.45*   Estimated Creatinine Clearance: 173.3 mL/min (A) (by C-G formula based on SCr of 0.45 mg/dL (L)). No results for input(s): VANCOTROUGH, VANCOPEAK, VANCORANDOM, GENTTROUGH, GENTPEAK, GENTRANDOM, TOBRATROUGH, TOBRAPEAK, TOBRARND, AMIKACINPEAK, AMIKACINTROU, AMIKACIN in the last 72 hours.   Microbiology:   Medical History: Past Medical History:  Diagnosis Date  . Abscess   . Diabetes mellitus without complication (HCC)     Assessment:  ID: Dapto #6 thoracic osteomyelitis, epidural abscess, pleural empyema, groin abscesses, S/p anterior and posterior left chest tube, both purulent drainage.Cx growing MRSA. Dr. Luciana Axeomer concerned about lung penetration - added doxy.  - Afebrile, WBC stable. Scr 0.45 - TEE negative for vegetation  Zosyn 7/14 >> x1 Vancomycin 7/14 CFTX 7/14>7/16 Dapto 7/16> Doxy 7/17 >  7/18 BCx>> 7/18 Abscess (L pleural)>> 7/15 MRSA PCR: Positive 7/14 Wound Cx: MRSA 7/14 BCX: MRSA  7/15 VT <4 1000mg  q8 hrs (increased to 1000mg  q6 hrs) 7/16 VT = 5 mcg/ml on 1gm IV q6h  7/16 CK: 20 (Monday's)   Goal of Therapy:  Eradication of infection   Plan:   1. Dapto  860 mg IV q24h. 2. Weekly CK-Mondays 3. F/u cultures  Tristen Luce S. Merilynn Finlandobertson, PharmD, BCPS Clinical Staff Pharmacist Pager [phone]  Misty Stanleyobertson, Tikita Mabee Stillinger 12/28/2016,11:29 AM

## 2016-12-28 NOTE — Progress Notes (Addendum)
[address]       [name] [address]             [phone]         Subjective: Primary c/o is back pain  Objective: Vital signs in last 24 hours: Temp:  [97.3 F (36.3 C)-99.2 F (37.3 C)] 99.2 F (37.3 C) (07/22 0300) Pulse Rate:  [76-90] 76 (07/22 0300) Cardiac Rhythm: Normal sinus rhythm (07/22 0712) Resp:  [15-22] 17 (07/22 0000) BP: (113-141)/(66-92) 140/92 (07/22 0300) SpO2:  [95 %-99 %] 99 % (07/22 0300) Weight:  [242 lb 3.2 oz (109.9 kg)] 242 lb 3.2 oz (109.9 kg) (07/22 0300)  Hemodynamic parameters for last 24 hours:    Intake/Output from previous day: 07/21 0701 - 07/22 0700 In: 960 [P.O.:960] Out: 2155 [Urine:2125; Drains:30] Intake/Output this shift: No intake/output data recorded.  General appearance: alert, cooperative, fatigued and no distress Heart: regular rate and rhythm Lungs: dim in left base Abdomen: benign  Lab Results: No results for input(s): WBC, HGB, HCT, PLT in the last 72 hours. BMET:  Recent Labs  12/27/16 0234 12/28/16 0204  NA 136 136  K 4.2 4.4  CL 101 101  CO2 25 27  GLUCOSE 223* 160*  BUN 10 8  CREATININE 0.44* 0.45*  CALCIUM 8.7* 9.0    PT/INR: No results for input(s): LABPROT, INR in the last 72 hours. ABG No results found for: PHART, HCO3, TCO2, ACIDBASEDEF, O2SAT CBG (last 3)   Recent Labs  12/27/16 1611 12/27/16 2228 12/28/16 0617  GLUCAP 187* 153* 123*    Meds Scheduled Meds: . cyclobenzaprine  5 mg Oral TID  . doxycycline  100 mg Oral Q12H  . enoxaparin (LOVENOX) injection  40 mg Subcutaneous Q24H  . insulin aspart  0-20 Units Subcutaneous TID WC  . insulin aspart  0-5 Units Subcutaneous QHS  . insulin aspart  8 Units Subcutaneous TID WC  . insulin glargine  50 Units Subcutaneous Daily  . ketorolac  30 mg Intravenous Q8H  . nicotine  21 mg Transdermal Daily  . polyethylene glycol  17 g Oral Daily  . senna-docusate  2 tablet Oral BID   Continuous Infusions: .  DAPTOmycin (CUBICIN)  IV Stopped (12/27/16 1507)   PRN Meds:.acetaminophen, bisacodyl, HYDROmorphone (DILAUDID) injection, ondansetron (ZOFRAN) IV, oxyCODONE-acetaminophen, zolpidem  Xrays Dg Chest Port 1 View  Result Date: 12/27/2016 CLINICAL DATA:  Empyema EXAM: PORTABLE CHEST 1 VIEW COMPARISON:  Two days ago FINDINGS: Two left-sided pigtail catheters. The more superior catheter is more inferiorly positioned today. Small volume left chest with unchanged loculation at the apex. Right chest remains clear. Heart size is accentuated by loculations along the left heart border that appear diminished from prior chest x-ray. IMPRESSION: 1. Apparent decrease in pleural collections along the left heart border. 2. Stable loculated pleural thickening and fluid at the left apex. An associated pleural catheter is more inferiorly positioned today. Electronically Signed   By: Marnee Spring M.D.   On: 12/27/2016 08:24    Assessment/Plan:  1 no drainage recorded from CT's yesterday, only 30 cc today. Only drainage appears to be from lower tube, could consider removing more apical tube soon.  He is afeb, no CBC done today- cont current abx per ID recs 2 sodium in normal range currently 3 sugar control is fair- management as per primary  LOS: 8 days    GOLD,WAYNE E 12/28/2016  I have seen and examined the patient and agree with the assessment  and plan as outlined.  No x-ray done today.  Probably can D/C apical drain tomorrow if there is no output from it and CXR stable.  Purcell Nailslarence H Owen, MD 12/28/2016 9:48 AM

## 2016-12-28 NOTE — Progress Notes (Signed)
[facility] Progress Note  Patient: Keith Riggs ZOX:[id number]   PCP: System, Pcp Not In DOB: Nov 05, 1990   DOA: 12/19/2016   DOS: 12/28/2016   Date of Service: the patient was seen and examined on 12/28/2016  Subjective: pain is well controlled, no acute complains.   Brief hospital course: 26 y/o with IDDM, past history of heroin abuse, cocaine abuse who get random drug tests 2 x a month. He is a transfer from [facility] ER. History obtained from Grandmother and father whom he lives with.  He was seen there for a right groin abscess on Wed which was I and D'd. He was seen again on Friday for a packing of the abscess and was found to have a left groin abscess with was I and D'd. He returned to there ER in the after noon because he thought his packing had fallen out. At that time, he complained of back pain and was sent to [facility] for and MRI. This showed thoracic epidural abscesses causing severe spinal cord compression, paraspinal myositis and abscesses, T6 osteo and b/l pleural effusions. Currently further plan is to monitor improvement in output from the chest tube.  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   Osteomyelitis of thoracic spine   Acute pyelonephritis   Soft tissue abscess of inguinal region- bilaterally  - lactic acid normal, Wbc 26, fever 100.3, tachycardia - Neurosurgery was consulted- recommend conservative management- cont Neuro checks every 4 hrs- re-call NS if any neurological changes - culture from wound on 7/11 is showing Strep Algactiae - blood culture from [facility] with gram + cocci in clusters - he has been on Keflex at home - MRSA PCR + - pleural fluid>> MRSA - blood culture 7/14 >> MRSA -  ID consulted  - 2 D ECHO negative for vegetation- no need for TEE as he will need a prolonged course of Vanc anyway - 7/16- Vanc trough < 4,  I discussed this with ID requested Daptomycin  which was started on 7/16  - 7/17- Doxy started for better lung penetration than Dapto - WBC counts - trending down    Left sided loculated effusion and septic pulmonary emboli - related to above  - strep pneumo neg - CT surgery consulted- not a VATS candidate at this time- recommended IR consult for pigtail catheter- IR consulted and drain placed on 7/14 - per CT surgery, he needs a second drain in left upper chest- IR consulted again, tolerated procedure , repeat chest x-ray shows improvement in collection volume. Plan is to maintain the tubes for today, repeat another chest x-ray tomorrow. No suction.  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 - Currently liberalizing fluid restriction.  Hypokalemia/ hypomagnesemia - cont to replace and follow    DM2 (diabetes mellitus, type 2) - cont Lantus and SSI- dose of Lantus increased to 50 units Changing sliding scale to resistant at present    IVDU (intravenous drug user) - has used IV Cocaine and Heroin in the past - states he has not used in 2-3 months - grandmother states he get random drug tests 2 x month - UDS shows Opiates which he is receiving int he hospital   Cigarette smoker - Nicotine patch    Normocytic anemia - anemia >> Low Iron levels and normal Iron binding, retic count normal- Ferritin high likely  due to infection - consistent with AOCD  Morbid Obesity  Body mass index is 33.05 kg/m.  Pain management. Patient was placed on scheduled OxyIR 6 times a day as well as on Dilaudid 0.5-1 mg every 2-4 hours as needed. On my assessment the patient is drowsy and sleepy and lethargic. At present I do not think the patient notes a scheduled narcotic medication. We change it to when necessary Percocet. Also changing Dilaudid from 0.5-1 mg 0.5 mg every 4 hours when necessary. Placing the patient on scheduled Toradol as I believe he can  tolerate that. Narcotic requirement has reduced significantly with adequate pain control.   Patient continues to have some back pain.flexeril added scheduled basis.   Diet: regular diet DVT Prophylaxis: subcutaneous Heparin  Advance goals of care discussion: full code  Family Communication: no family was present at bedside, at the time of interview.   Disposition:  Discharge to be determined.  Consultants: ID, neurosurgery, CT surgery, nephrology, IR Procedures: IR guided drain placement, left chest tube 7/14, drainage catheter placement 7/18  Antibiotics: Anti-infectives    Start     Dose/Rate Route Frequency Ordered Stop   12/23/16 1230  doxycycline (VIBRA-TABS) tablet 100 mg     100 mg Oral Every 12 hours 12/23/16 1203     12/23/16 1200  DAPTOmycin (CUBICIN) 860 mg in sodium chloride 0.9 % IVPB     8 mg/kg  107.5 kg 234.4 mL/hr over 30 Minutes Intravenous Every 24 hours 12/22/16 1858     12/22/16 1230  DAPTOmycin (CUBICIN) 860 mg in sodium chloride 0.9 % IVPB     8 mg/kg  107.5 kg 234.4 mL/hr over 30 Minutes Intravenous Every 24 hours 12/22/16 1151 12/22/16 1337   12/21/16 2200  vancomycin (VANCOCIN) IVPB 1000 mg/200 mL premix  Status:  Discontinued     1,000 mg 200 mL/hr over 60 Minutes Intravenous Every 6 hours 12/21/16 2110 12/22/16 1850   12/21/16 2000  vancomycin (VANCOCIN) IVPB 1000 mg/200 mL premix  Status:  Discontinued     1,000 mg 200 mL/hr over 60 Minutes Intravenous Every 8 hours 12/21/16 1339 12/21/16 2109   12/20/16 1300  piperacillin-tazobactam (ZOSYN) IVPB 3.375 g  Status:  Discontinued     3.375 g 12.5 mL/hr over 240 Minutes Intravenous Every 8 hours 12/20/16 1109 12/20/16 1214   12/20/16 1300  cefTRIAXone (ROCEPHIN) 2 g in dextrose 5 % 50 mL IVPB  Status:  Discontinued     2 g 100 mL/hr over 30 Minutes Intravenous Every 24 hours 12/20/16 1214 12/22/16 2001   12/20/16 0200  vancomycin (VANCOCIN) IVPB 1000 mg/200 mL premix  Status:  Discontinued     1,000  mg 200 mL/hr over 60 Minutes Intravenous Every 8 hours 12/20/16 0024 12/21/16 1339   12/20/16 0200  piperacillin-tazobactam (ZOSYN) IVPB 3.375 g  Status:  Discontinued     3.375 g 12.5 mL/hr over 240 Minutes Intravenous Every 8 hours 12/20/16 0024 12/20/16 1109       Objective: Physical Exam: Vitals:   12/28/16 0000 12/28/16 0300 12/28/16 1314 12/28/16 1315  BP: 113/76 (!) 140/92  (!) 141/90  Pulse:  76    Resp: 17  19   Temp: (!) 97.3 F (36.3 C) 99.2 F (37.3 C)    TempSrc: Oral Oral    SpO2: 96% 99% 99% 100%  Weight:  109.9 kg (242 lb 3.2 oz)    Height:        Intake/Output Summary (Last 24 hours) at 12/28/16 1713 Last  data filed at 12/28/16 1300  Gross per 24 hour  Intake              360 ml  Output             2055 ml  Net            -1695 ml   Filed Weights   12/19/16 2324 12/28/16 0300  Weight: 107.5 kg (237 lb) 109.9 kg (242 lb 3.2 oz)   General: alert and Oriented to Time, Place and Person. Appear in mild distress, affect appropriate Eyes: PERRL, Conjunctiva normal ENT: Oral Mucosa clear moist. Neck: no JVD, no Abnormal Mass Or lumps Cardiovascular: S1 and S2 Present, no Murmur, Peripheral Pulses Present Respiratory: normal respiratory effort, Bilateral Air entry equal and Decreased, no use of accessory muscle, basal Crackles, no wheezes Abdomen: Bowel Sound present, Soft and no tenderness, no hernia Skin: no redness, no Rash, no induration Extremities: no Pedal edema, no calf tenderness Neurologic: Grossly no focal neuro deficit. Bilaterally Equal motor strength  Data Reviewed: CBC:  Recent Labs Lab 12/22/16 0211 12/23/16 0401 12/24/16 0634 12/25/16 0330  WBC 25.6* 19.7* 20.1* 20.2*  HGB 10.9* 9.8* 9.8* 9.6*  HCT 33.6* 30.0* 30.3* 29.9*  MCV 83.0 82.6 82.6 82.8  PLT 610* 602* 619* 580*   Basic Metabolic Panel:  Recent Labs Lab 12/24/16 0634 12/25/16 0330 12/26/16 0259 12/27/16 0234 12/28/16 0204  NA 126* 127* 132* 136 136  K 4.0 4.0 4.1  4.2 4.4  CL 90* 91* 96* 101 101  CO2 29 29 27 25 27   GLUCOSE 156* 180* 307* 223* 160*  BUN <5* 5* 6 10 8   CREATININE 0.42* 0.48* 0.48* 0.44* 0.45*  CALCIUM 8.5* 8.2* 8.4* 8.7* 9.0    Liver Function Tests: No results for input(s): AST, ALT, ALKPHOS, BILITOT, PROT, ALBUMIN in the last 168 hours. No results for input(s): LIPASE, AMYLASE in the last 168 hours. No results for input(s): AMMONIA in the last 168 hours. Coagulation Profile:  Recent Labs Lab 12/23/16 0755  INR 1.20   Cardiac Enzymes:  Recent Labs Lab 12/22/16 1212  CKTOTAL 20*   BNP (last 3 results) No results for input(s): PROBNP in the last 8760 hours. CBG:  Recent Labs Lab 12/27/16 1611 12/27/16 2228 12/28/16 0617 12/28/16 1148 12/28/16 1701  GLUCAP 187* 153* 123* 167* 146*   Studies: No results found.  Scheduled Meds: . cyclobenzaprine  5 mg Oral TID  . doxycycline  100 mg Oral Q12H  . enoxaparin (LOVENOX) injection  40 mg Subcutaneous Q24H  . insulin aspart  0-20 Units Subcutaneous TID WC  . insulin aspart  0-5 Units Subcutaneous QHS  . insulin aspart  8 Units Subcutaneous TID WC  . insulin glargine  50 Units Subcutaneous Daily  . ketorolac  30 mg Intravenous Q8H  . nicotine  21 mg Transdermal Daily  . polyethylene glycol  17 g Oral Daily  . senna-docusate  2 tablet Oral BID   Continuous Infusions: . DAPTOmycin (CUBICIN)  IV Stopped (12/28/16 1529)   PRN Meds: acetaminophen, bisacodyl, HYDROmorphone (DILAUDID) injection, ondansetron (ZOFRAN) IV, oxyCODONE-acetaminophen, zolpidem  Time spent: 30 minutes  Author: Lynden Oxford, MD [facility] Pager: [phone] 12/28/2016 5:13 PM  If 7PM-7AM, please contact night-coverage at [email or web address], password [facility]

## 2016-12-29 ENCOUNTER — Inpatient Hospital Stay (HOSPITAL_COMMUNITY): Payer: Self-pay

## 2016-12-29 DIAGNOSIS — J90 Pleural effusion, not elsewhere classified: Secondary | ICD-10-CM

## 2016-12-29 LAB — COMPREHENSIVE METABOLIC PANEL
ALBUMIN: 2.5 g/dL — AB (ref 3.5–5.0)
ALK PHOS: 101 U/L (ref 38–126)
ALT: 53 U/L (ref 17–63)
ANION GAP: 9 (ref 5–15)
AST: 23 U/L (ref 15–41)
BILIRUBIN TOTAL: 0.5 mg/dL (ref 0.3–1.2)
BUN: 9 mg/dL (ref 6–20)
CALCIUM: 9.3 mg/dL (ref 8.9–10.3)
CO2: 30 mmol/L (ref 22–32)
CREATININE: 0.52 mg/dL — AB (ref 0.61–1.24)
Chloride: 96 mmol/L — ABNORMAL LOW (ref 101–111)
GFR calc Af Amer: >60 mL/min (ref >60)
GFR calc non Af Amer: >60 mL/min (ref >60)
GLUCOSE: 91 mg/dL (ref 65–99)
Potassium: 4.1 mmol/L (ref 3.5–5.1)
SODIUM: 135 mmol/L (ref 135–145)
TOTAL PROTEIN: 7.7 g/dL (ref 6.5–8.1)

## 2016-12-29 LAB — CBC WITH DIFFERENTIAL/PLATELET
BASOS ABS: 0 10*3/uL (ref 0.0–0.1)
BASOS PCT: 0 %
EOS ABS: 0.1 10*3/uL (ref 0.0–0.7)
Eosinophils Relative: 1 %
HEMATOCRIT: 36.7 % — AB (ref 39.0–52.0)
HEMOGLOBIN: 11.9 g/dL — AB (ref 13.0–17.0)
Lymphocytes Relative: 24 %
Lymphs Abs: 3.1 10*3/uL (ref 0.7–4.0)
MCH: 27.2 pg (ref 26.0–34.0)
MCHC: 32.4 g/dL (ref 30.0–36.0)
MCV: 84 fL (ref 78.0–100.0)
MONOS PCT: 6 %
Monocytes Absolute: 0.8 10*3/uL (ref 0.1–1.0)
NEUTROS ABS: 9.1 10*3/uL — AB (ref 1.7–7.7)
NEUTROS PCT: 69 %
Platelets: 789 10*3/uL — ABNORMAL HIGH (ref 150–400)
RBC: 4.37 MIL/uL (ref 4.22–5.81)
RDW: 14 % (ref 11.5–15.5)
WBC: 13.1 10*3/uL — AB (ref 4.0–10.5)

## 2016-12-29 LAB — CULTURE, BLOOD (ROUTINE X 2)
CULTURE: NO GROWTH
Culture: NO GROWTH
Special Requests: ADEQUATE
Special Requests: ADEQUATE

## 2016-12-29 LAB — AEROBIC/ANAEROBIC CULTURE (SURGICAL/DEEP WOUND)
CULTURE: NO GROWTH
SPECIAL REQUESTS: NORMAL

## 2016-12-29 LAB — CK: Total CK: 24 U/L — ABNORMAL LOW (ref 49–397)

## 2016-12-29 LAB — PROTIME-INR
INR: 1.03
Prothrombin Time: 13.5 seconds (ref 11.4–15.2)

## 2016-12-29 LAB — MAGNESIUM: MAGNESIUM: 1.8 mg/dL (ref 1.7–2.4)

## 2016-12-29 LAB — GLUCOSE, CAPILLARY
GLUCOSE-CAPILLARY: 158 mg/dL — AB (ref 65–99)
Glucose-Capillary: 140 mg/dL — ABNORMAL HIGH (ref 65–99)
Glucose-Capillary: 212 mg/dL — ABNORMAL HIGH (ref 65–99)
Glucose-Capillary: 87 mg/dL (ref 65–99)

## 2016-12-29 LAB — AEROBIC/ANAEROBIC CULTURE W GRAM STAIN (SURGICAL/DEEP WOUND)

## 2016-12-29 MED ORDER — INSULIN ASPART 100 UNIT/ML ~~LOC~~ SOLN
0.0000 [IU] | Freq: Three times a day (TID) | SUBCUTANEOUS | Status: DC
  Administered 2016-12-29: 5 [IU] via SUBCUTANEOUS
  Administered 2016-12-30 (×2): 3 [IU] via SUBCUTANEOUS
  Administered 2016-12-31 (×2): 2 [IU] via SUBCUTANEOUS
  Administered 2017-01-01 (×2): 5 [IU] via SUBCUTANEOUS
  Administered 2017-01-02: 2 [IU] via SUBCUTANEOUS
  Administered 2017-01-02: 3 [IU] via SUBCUTANEOUS
  Administered 2017-01-03: 2 [IU] via SUBCUTANEOUS
  Administered 2017-01-03: 3 [IU] via SUBCUTANEOUS
  Administered 2017-01-04: 2 [IU] via SUBCUTANEOUS
  Administered 2017-01-04: 11 [IU] via SUBCUTANEOUS
  Administered 2017-01-05: 5 [IU] via SUBCUTANEOUS
  Administered 2017-01-06: 2 [IU] via SUBCUTANEOUS

## 2016-12-29 MED ORDER — INSULIN ASPART 100 UNIT/ML ~~LOC~~ SOLN: 0.0000 [IU] | Freq: Every day | SUBCUTANEOUS | Status: DC

## 2016-12-29 NOTE — Progress Notes (Signed)
[facility] Progress Note  Patient: Keith Riggs ZOX:[id number]   PCP: System, Pcp Not In DOB: [birth year]   DOA: 12/19/2016   DOS: 12/29/2016   Date of Service: the patient was seen and examined on 12/29/2016  Subjective: Pain is better. No nausea no vomiting. No shortness of breath.  Brief hospital course: 26 y/o with IDDM, past history of heroin abuse, cocaine abuse who get random drug tests 2 x a month. He is a transfer from [facility] ER. History obtained from Grandmother and father whom he lives with.  He was seen there for a right groin abscess on Wed which was I and D'd. He was seen again on Friday for a packing of the abscess and was found to have a left groin abscess with was I and D'd. He returned to there ER in the after noon because he thought his packing had fallen out. At that time, he complained of back pain and was sent to [facility] for and MRI. This showed thoracic epidural abscesses causing severe spinal cord compression, paraspinal myositis and abscesses, T6 osteo and b/l pleural effusions. Currently further plan is to monitor improvement in output from the chest tube.  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   Osteomyelitis of thoracic spine   Acute pyelonephritis   Soft tissue abscess of inguinal region- bilaterally  - lactic acid normal, Wbc 26, fever 100.3, tachycardia - Neurosurgery was consulted- recommend conservative management- cont Neuro checks every 4 hrs- re-call NS if any neurological changes - culture from wound on 7/11 is showing Strep Algactiae - blood culture from [facility] with gram + cocci in clusters - he has been on Keflex at home - MRSA PCR + - pleural fluid>> MRSA - blood culture 7/14 >> MRSA -  ID consulted  - 2 D ECHO negative for vegetation- no need for TEE as he will need a prolonged course of Vanc anyway - 7/16- Vanc trough < 4,  I discussed this with ID  requested Daptomycin which was started on 7/16  - 7/17- Doxy started for better lung penetration than Dapto - WBC counts - trending down    Left sided loculated effusion and septic pulmonary emboli - related to above  - strep pneumo neg - CT surgery consulted- not a VATS candidate at this time- recommended IR consult for pigtail catheter- IR consulted and drain placed on 7/14 - per CT surgery, he needs a second drain in left upper chest- IR consulted again, tolerated procedure , repeat chest x-ray shows improvement in collection volume. Chest tubes have been removed. Repeat chest x-ray is reassuring. Monitor recommendation from cardiology thoracic surgery.  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 - Currently liberalizing fluid restriction.  Hypokalemia/ hypomagnesemia - cont to replace and follow    DM2 (diabetes mellitus, type 2) - cont Lantus and SSI- dose of Lantus increased to 50 units Changing sliding scale to resistant at present    IVDU (intravenous drug user) - has used IV Cocaine and Heroin in the past - states he has not used in 2-3 months - grandmother states he get random drug tests 2 x month - UDS shows Opiates which he is receiving int he hospital   Cigarette smoker - Nicotine patch    Normocytic anemia - anemia >> Low Iron levels and normal Iron binding, retic count  normal- Ferritin high likely due to infection - consistent with AOCD  Morbid Obesity  Body mass index is 33.05 kg/m.  Pain management. Patient was placed on scheduled OxyIR 6 times a day as well as on Dilaudid 0.5-1 mg every 2-4 hours as needed. On my assessment the patient is drowsy and sleepy and lethargic. At present I do not think the patient notes a scheduled narcotic medication. We change it to when necessary Percocet. Also changing Dilaudid from 0.5-1 mg 0.5 mg every 4 hours when necessary. Placing  the patient on scheduled Toradol as I believe he can tolerate that. Narcotic requirement has reduced significantly with adequate pain control.   Patient continues to have some back pain.flexeril added scheduled basis.   Diet: regular diet DVT Prophylaxis: subcutaneous Heparin  Advance goals of care discussion: full code  Family Communication: no family was present at bedside, at the time of interview.   Disposition:  Discharge to SNF as the patient will require IV antibiotics and has history of IV drug abuse.  Consultants: ID, neurosurgery, CT surgery, nephrology, IR Procedures: IR guided drain placement, left chest tube 7/14, drainage catheter placement 7/18  Antibiotics: Anti-infectives    Start     Dose/Rate Route Frequency Ordered Stop   12/23/16 1230  doxycycline (VIBRA-TABS) tablet 100 mg     100 mg Oral Every 12 hours 12/23/16 1203     12/23/16 1200  DAPTOmycin (CUBICIN) 860 mg in sodium chloride 0.9 % IVPB     8 mg/kg  107.5 kg 234.4 mL/hr over 30 Minutes Intravenous Every 24 hours 12/22/16 1858     12/22/16 1230  DAPTOmycin (CUBICIN) 860 mg in sodium chloride 0.9 % IVPB     8 mg/kg  107.5 kg 234.4 mL/hr over 30 Minutes Intravenous Every 24 hours 12/22/16 1151 12/22/16 1337   12/21/16 2200  vancomycin (VANCOCIN) IVPB 1000 mg/200 mL premix  Status:  Discontinued     1,000 mg 200 mL/hr over 60 Minutes Intravenous Every 6 hours 12/21/16 2110 12/22/16 1850   12/21/16 2000  vancomycin (VANCOCIN) IVPB 1000 mg/200 mL premix  Status:  Discontinued     1,000 mg 200 mL/hr over 60 Minutes Intravenous Every 8 hours 12/21/16 1339 12/21/16 2109   12/20/16 1300  piperacillin-tazobactam (ZOSYN) IVPB 3.375 g  Status:  Discontinued     3.375 g 12.5 mL/hr over 240 Minutes Intravenous Every 8 hours 12/20/16 1109 12/20/16 1214   12/20/16 1300  cefTRIAXone (ROCEPHIN) 2 g in dextrose 5 % 50 mL IVPB  Status:  Discontinued     2 g 100 mL/hr over 30 Minutes Intravenous Every 24 hours 12/20/16  1214 12/22/16 2001   12/20/16 0200  vancomycin (VANCOCIN) IVPB 1000 mg/200 mL premix  Status:  Discontinued     1,000 mg 200 mL/hr over 60 Minutes Intravenous Every 8 hours 12/20/16 0024 12/21/16 1339   12/20/16 0200  piperacillin-tazobactam (ZOSYN) IVPB 3.375 g  Status:  Discontinued     3.375 g 12.5 mL/hr over 240 Minutes Intravenous Every 8 hours 12/20/16 0024 12/20/16 1109       Objective: Physical Exam: Vitals:   12/28/16 2022 12/29/16 0006 12/29/16 0457 12/29/16 1624  BP: 125/74 128/78    Pulse:    89  Resp: 20 19    Temp: 97.7 F (36.5 C) 97.9 F (36.6 C) 97.7 F (36.5 C) 97.7 F (36.5 C)  TempSrc: Oral Oral Oral Oral  SpO2: 100% 98%    Weight:   107.8 kg (237 lb 11.2  oz)   Height:        Intake/Output Summary (Last 24 hours) at 12/29/16 1804 Last data filed at 12/29/16 1500  Gross per 24 hour  Intake             1020 ml  Output              915 ml  Net              105 ml   Filed Weights   12/19/16 2324 12/28/16 0300 12/29/16 0457  Weight: 107.5 kg (237 lb) 109.9 kg (242 lb 3.2 oz) 107.8 kg (237 lb 11.2 oz)   General: alert and Oriented to Time, Place and Person. Appear in mild distress, affect appropriate Eyes: PERRL, Conjunctiva normal ENT: Oral Mucosa clear moist. Neck: no JVD, no Abnormal Mass Or lumps Cardiovascular: S1 and S2 Present, no Murmur, Peripheral Pulses Present Respiratory: normal respiratory effort, Bilateral Air entry equal and Decreased, no use of accessory muscle, basal Crackles, no wheezes Abdomen: Bowel Sound present, Soft and no tenderness, no hernia Skin: no redness, no Rash, no induration Extremities: no Pedal edema, no calf tenderness Neurologic: Grossly no focal neuro deficit. Bilaterally Equal motor strength  Data Reviewed: CBC:  Recent Labs Lab 12/23/16 0401 12/24/16 0634 12/25/16 0330 12/29/16 0456  WBC 19.7* 20.1* 20.2* 13.1*  NEUTROABS  --   --   --  9.1*  HGB 9.8* 9.8* 9.6* 11.9*  HCT 30.0* 30.3* 29.9* 36.7*  MCV  82.6 82.6 82.8 84.0  PLT 602* 619* 580* 789*   Basic Metabolic Panel:  Recent Labs Lab 12/25/16 0330 12/26/16 0259 12/27/16 0234 12/28/16 0204 12/29/16 0456  NA 127* 132* 136 136 135  K 4.0 4.1 4.2 4.4 4.1  CL 91* 96* 101 101 96*  CO2 29 27 25 27 30   GLUCOSE 180* 307* 223* 160* 91  BUN 5* 6 10 8 9   CREATININE 0.48* 0.48* 0.44* 0.45* 0.52*  CALCIUM 8.2* 8.4* 8.7* 9.0 9.3  MG  --   --   --   --  1.8    Liver Function Tests:  Recent Labs Lab 12/29/16 0456  AST 23  ALT 53  ALKPHOS 101  BILITOT 0.5  PROT 7.7  ALBUMIN 2.5*   No results for input(s): LIPASE, AMYLASE in the last 168 hours. No results for input(s): AMMONIA in the last 168 hours. Coagulation Profile:  Recent Labs Lab 12/23/16 0755 12/29/16 0456  INR 1.20 1.03   Cardiac Enzymes:  Recent Labs Lab 12/29/16 0456  CKTOTAL 24*   BNP (last 3 results) No results for input(s): PROBNP in the last 8760 hours. CBG:  Recent Labs Lab 12/28/16 1701 12/28/16 2113 12/29/16 0556 12/29/16 1152 12/29/16 1620  GLUCAP 146* 109* 140* 212* 87   Studies: Dg Chest 2 View  Result Date: 12/29/2016 CLINICAL DATA:  Empyema. EXAM: CHEST  2 VIEW COMPARISON:  Radiograph of December 27, 2016.  CT scan of December 23, 2016. FINDINGS: The heart size and mediastinal contours are within normal limits. No pneumothorax is noted. Stable right apical densities are noted most consistent with pneumonia or other inflammation. Two left-sided pleural drainage catheters are again noted and unchanged. Stable small loculated pleural effusions are noted along the left lateral chest wall. The visualized skeletal structures are unremarkable. IMPRESSION: Stable position of 2 left-sided pleural drainage catheters, with stable small loculated pleural effusions noted along left lateral chest wall. Stable right apical densities are noted most consistent with pneumonia or other inflammation. Electronically  Signed   By: Lupita Raider, M.D.   On: 12/29/2016  07:34   Dg Chest Port 1 View  Result Date: 12/29/2016 CLINICAL DATA:  Post chest tube removal film EXAM: PORTABLE CHEST - 1 VIEW COMPARISON:  Earlier film of the same day a FINDINGS: 2 left pigtail catheters have been removed. No pneumothorax. Persistent left lateral pleural thickening or loculated effusion. Focal right upper lobe airspace disease persists. Heart size and mediastinal contours are within normal limits. Visualized bones unremarkable. IMPRESSION: 1. Removal of 2 left chest tubes with no pneumothorax. 2. Residual left pleural thickening or loculated effusion. 3. Focal airspace infiltrate in the right upper lobe as before. Electronically Signed   By: Corlis Leak M.D.   On: 12/29/2016 15:40    Scheduled Meds: . cyclobenzaprine  5 mg Oral TID  . doxycycline  100 mg Oral Q12H  . enoxaparin (LOVENOX) injection  40 mg Subcutaneous Q24H  . insulin aspart  0-15 Units Subcutaneous TID WC  . insulin aspart  0-5 Units Subcutaneous QHS  . insulin aspart  8 Units Subcutaneous TID WC  . insulin glargine  50 Units Subcutaneous Daily  . ketorolac  30 mg Intravenous Q8H  . nicotine  21 mg Transdermal Daily  . polyethylene glycol  17 g Oral Daily  . senna-docusate  2 tablet Oral BID   Continuous Infusions: . DAPTOmycin (CUBICIN)  IV Stopped (12/29/16 1246)   PRN Meds: acetaminophen, bisacodyl, HYDROmorphone (DILAUDID) injection, ondansetron (ZOFRAN) IV, oxyCODONE-acetaminophen, zolpidem  Time spent: 30 minutes  Author: Lynden Oxford, MD [facility] Pager: [phone] 12/29/2016 6:04 PM  If 7PM-7AM, please contact night-coverage at [email or web address], password [facility]

## 2016-12-29 NOTE — Progress Notes (Addendum)
      [address]       [place] [id number]             [phone]      Subjective:  No new complaints.  Continued back pain.  Objective: Vital signs in last 24 hours: Temp:  [97.7 F (36.5 C)-97.9 F (36.6 C)] 97.7 F (36.5 C) (07/23 0457) Cardiac Rhythm: Normal sinus rhythm (07/23 0701) Resp:  [19-20] 19 (07/23 0006) BP: (125-141)/(74-90) 128/78 (07/23 0006) SpO2:  [90 %-100 %] 98 % (07/23 0006) Weight:  [237 lb 11.2 oz (107.8 kg)] 237 lb 11.2 oz (107.8 kg) (07/23 0457)  Intake/Output from previous day: 07/22 0701 - 07/23 0700 In: 1257.2 [P.O.:1140; IV Piggyback:117.2] Out: 1265 [Urine:1250; Drains:15]  General appearance: alert, cooperative and no distress Heart: regular rate and rhythm Lungs: diminished breath sounds right base Abdomen: soft, non-tender; bowel sounds normal; no masses,  no organomegaly Wound: clean and dry  Lab Results:  Recent Labs  12/29/16 0456  WBC 13.1*  HGB 11.9*  HCT 36.7*  PLT 789*   BMET:  Recent Labs  12/28/16 0204 12/29/16 0456  NA 136 135  K 4.4 4.1  CL 101 96*  CO2 27 30  GLUCOSE 160* 91  BUN 8 9  CREATININE 0.45* 0.52*  CALCIUM 9.0 9.3    PT/INR:  Recent Labs  12/29/16 0456  LABPROT 13.5  INR 1.03   ABG No results found for: PHART, HCO3, TCO2, ACIDBASEDEF, O2SAT CBG (last 3)   Recent Labs  12/28/16 2113 12/29/16 0556 12/29/16 1152  GLUCAP 109* 140* 212*    Assessment/Plan:  1. Empyema- chest tube output remains minimal- CXR is stable in appearance- will remove apical drain today, as posterior drain continues to drain, fluid is now serous 2. ID-continue ABX per ID, will need 6 weeks of therapy, remains afebrile 3. DM 4. Hyponatremia- resolved 5. Dispo- patient stable, will d/c apical chest tube.. If output remains low can possibly remove posterior tube in next 24-48 hours, care per primary   LOS: 9 days    BARRETT, ERIN 12/29/2016 Patient seen and examined, agree with  above C/o pain in back and from where CT were pulled Post removal CXR ok with small area of residual loculated pleural fluid  Viviann SpareSteven C. Dorris FetchHendrickson, MD [facility] [phone]

## 2016-12-29 NOTE — Progress Notes (Signed)
[facility]   Reason for visit: Follow up on MRSA disseminated infection  Interval History: no fever, no chills, WBC 13.1.  No associated n/v/d.  No rashes.  Main complaint is back and pain at chest tube sites.  Day 11 antibiotics daptomycin day 8 Doxycyline day 7  Physical Exam: Constitutional:  Vitals:   12/29/16 0006 12/29/16 0457  BP: 128/78   Pulse:    Resp: 19   Temp: 97.9 F (36.6 C) 97.7 F (36.5 C)   patient appears in NAD Respiratory: Normal respiratory effort; CTA B Cardiovascular: RRR GI: soft, nt, nd  Review of Systems: Constitutional: negative for fevers and chills Respiratory: negative for cough or sputum Gastrointestinal: negative for nausea  Lab Results  Component Value Date   WBC 13.1 (H) 12/29/2016   HGB 11.9 (L) 12/29/2016   HCT 36.7 (L) 12/29/2016   MCV 84.0 12/29/2016   PLT 789 (H) 12/29/2016    Lab Results  Component Value Date   CREATININE 0.52 (L) 12/29/2016   BUN 9 12/29/2016   NA 135 12/29/2016   K 4.1 12/29/2016   CL 96 (L) 12/29/2016   CO2 30 12/29/2016    Lab Results  Component Value Date   ALT 53 12/29/2016   AST 23 12/29/2016   ALKPHOS 101 12/29/2016     Microbiology: Recent Results (from the past 240 hour(s))  Blood culture (routine x 2)     Status: Abnormal   Collection Time: 12/20/16 12:01 AM  Result Value Ref Range Status   Specimen Description BLOOD RIGHT HAND  Final   Special Requests   Final    BOTTLES DRAWN AEROBIC AND ANAEROBIC Blood Culture adequate volume   Culture  Setup Time   Final    GRAM POSITIVE COCCI IN CLUSTERS ANAEROBIC BOTTLE ONLY CRITICAL RESULT CALLED TO, READ BACK BY AND VERIFIED WITH: K. COOK PHARMD, AT 0701 12/22/16 BY D.VANHOOK    Culture (A)  Final    STAPHYLOCOCCUS AUREUS SUSCEPTIBILITIES PERFORMED ON PREVIOUS CULTURE WITHIN THE LAST 5 DAYS.    Report Status 12/24/2016 FINAL  Final  Blood culture (routine x 2)     Status: Abnormal   Collection Time:  12/20/16 12:20 AM  Result Value Ref Range Status   Specimen Description BLOOD LEFT ANTECUBITAL  Final   Special Requests   Final    BOTTLES DRAWN AEROBIC AND ANAEROBIC Blood Culture adequate volume   Culture  Setup Time   Final    GRAM POSITIVE COCCI IN CLUSTERS ANAEROBIC BOTTLE ONLY CRITICAL RESULT CALLED TO, READ BACK BY AND VERIFIED WITH: Patrick North, PHARMD 2111 12/21/2016 T. TYSOR    Culture METHICILLIN RESISTANT STAPHYLOCOCCUS AUREUS (A)  Final   Report Status 12/23/2016 FINAL  Final   Organism ID, Bacteria METHICILLIN RESISTANT STAPHYLOCOCCUS AUREUS  Final      Susceptibility   Methicillin resistant staphylococcus aureus - MIC*    CIPROFLOXACIN >=8 RESISTANT Resistant     ERYTHROMYCIN <=0.25 SENSITIVE Sensitive     GENTAMICIN <=0.5 SENSITIVE Sensitive     OXACILLIN >=4 RESISTANT Resistant     TETRACYCLINE <=1 SENSITIVE Sensitive     VANCOMYCIN <=0.5 SENSITIVE Sensitive     TRIMETH/SULFA <=10 SENSITIVE Sensitive     CLINDAMYCIN <=0.25 SENSITIVE Sensitive     RIFAMPIN <=0.5 SENSITIVE Sensitive     Inducible Clindamycin NEGATIVE Sensitive     * METHICILLIN RESISTANT STAPHYLOCOCCUS AUREUS  Blood Culture ID Panel (Reflexed)     Status: Abnormal   Collection  Time: 12/20/16 12:20 AM  Result Value Ref Range Status   Enterococcus species NOT DETECTED NOT DETECTED Final   Listeria monocytogenes NOT DETECTED NOT DETECTED Final   Staphylococcus species DETECTED (A) NOT DETECTED Final    Comment: CRITICAL RESULT CALLED TO, READ BACK BY AND VERIFIED WITH: Patrick North. BATCHELDER, PHARMD 2111 12/21/2016 T. TYSOR    Staphylococcus aureus DETECTED (A) NOT DETECTED Final    Comment: Methicillin (oxacillin)-resistant Staphylococcus aureus (MRSA). MRSA is predictably resistant to beta-lactam antibiotics (except ceftaroline). Preferred therapy is vancomycin unless clinically contraindicated. Patient requires contact precautions if  hospitalized. CRITICAL RESULT CALLED TO, READ BACK BY AND VERIFIED  WITH: Patrick North. BATCHELDER, PHARMD 2111 12/21/2016 T. TYSOR    Methicillin resistance DETECTED (A) NOT DETECTED Final    Comment: CRITICAL RESULT CALLED TO, READ BACK BY AND VERIFIED WITH: Patrick North. BATCHELDER, PHARMD 2111 12/21/2016 T. TYSOR    Streptococcus species NOT DETECTED NOT DETECTED Final   Streptococcus agalactiae NOT DETECTED NOT DETECTED Final   Streptococcus pneumoniae NOT DETECTED NOT DETECTED Final   Streptococcus pyogenes NOT DETECTED NOT DETECTED Final   Acinetobacter baumannii NOT DETECTED NOT DETECTED Final   Enterobacteriaceae species NOT DETECTED NOT DETECTED Final   Enterobacter cloacae complex NOT DETECTED NOT DETECTED Final   Escherichia coli NOT DETECTED NOT DETECTED Final   Klebsiella oxytoca NOT DETECTED NOT DETECTED Final   Klebsiella pneumoniae NOT DETECTED NOT DETECTED Final   Proteus species NOT DETECTED NOT DETECTED Final   Serratia marcescens NOT DETECTED NOT DETECTED Final   Haemophilus influenzae NOT DETECTED NOT DETECTED Final   Neisseria meningitidis NOT DETECTED NOT DETECTED Final   Pseudomonas aeruginosa NOT DETECTED NOT DETECTED Final   Candida albicans NOT DETECTED NOT DETECTED Final   Candida glabrata NOT DETECTED NOT DETECTED Final   Candida krusei NOT DETECTED NOT DETECTED Final   Candida parapsilosis NOT DETECTED NOT DETECTED Final   Candida tropicalis NOT DETECTED NOT DETECTED Final  Aerobic/Anaerobic Culture (surgical/deep wound)     Status: None   Collection Time: 12/20/16  4:34 PM  Result Value Ref Range Status   Specimen Description WOUND PLEURAL LEFT  Final   Special Requests Normal  Final   Gram Stain   Final    ABUNDANT WBC PRESENT, PREDOMINANTLY PMN MODERATE GRAM POSITIVE COCCI IN CLUSTERS    Culture   Final    ABUNDANT METHICILLIN RESISTANT STAPHYLOCOCCUS AUREUS NO ANAEROBES ISOLATED    Report Status 12/25/2016 FINAL  Final   Organism ID, Bacteria METHICILLIN RESISTANT STAPHYLOCOCCUS AUREUS  Final      Susceptibility   Methicillin  resistant staphylococcus aureus - MIC*    CIPROFLOXACIN >=8 RESISTANT Resistant     ERYTHROMYCIN <=0.25 SENSITIVE Sensitive     GENTAMICIN <=0.5 SENSITIVE Sensitive     OXACILLIN >=4 RESISTANT Resistant     TETRACYCLINE <=1 SENSITIVE Sensitive     VANCOMYCIN 1 SENSITIVE Sensitive     TRIMETH/SULFA <=10 SENSITIVE Sensitive     CLINDAMYCIN <=0.25 SENSITIVE Sensitive     RIFAMPIN <=0.5 SENSITIVE Sensitive     Inducible Clindamycin NEGATIVE Sensitive     * ABUNDANT METHICILLIN RESISTANT STAPHYLOCOCCUS AUREUS  MRSA PCR Screening     Status: Abnormal   Collection Time: 12/21/16  7:59 AM  Result Value Ref Range Status   MRSA by PCR POSITIVE (A) NEGATIVE Final    Comment:        The GeneXpert MRSA Assay (FDA approved for NASAL specimens only), is one component of a comprehensive MRSA colonization surveillance program. It  is not intended to diagnose MRSA infection nor to guide or monitor treatment for MRSA infections. RESULT CALLED TO, READ BACK BY AND VERIFIED WITH: T.MAINIERL RN AT 1025 12/21/16 BY A.DAVIS   Culture, blood (routine x 2)     Status: None (Preliminary result)   Collection Time: 12/24/16  6:34 AM  Result Value Ref Range Status   Specimen Description BLOOD LEFT HAND  Final   Special Requests IN PEDIATRIC BOTTLE Blood Culture adequate volume  Final   Culture NO GROWTH 4 DAYS  Final   Report Status PENDING  Incomplete  Culture, blood (routine x 2)     Status: None (Preliminary result)   Collection Time: 12/24/16  6:34 AM  Result Value Ref Range Status   Specimen Description BLOOD RIGHT HAND  Final   Special Requests   Final    BOTTLES DRAWN AEROBIC ONLY Blood Culture adequate volume   Culture NO GROWTH 4 DAYS  Final   Report Status PENDING  Incomplete  Aerobic/Anaerobic Culture (surgical/deep wound)     Status: None (Preliminary result)   Collection Time: 12/24/16  3:45 PM  Result Value Ref Range Status   Specimen Description ABSCESS LEFT PLEURAL  Final   Special  Requests Normal  Final   Gram Stain   Final    FEW WBC PRESENT,BOTH PMN AND MONONUCLEAR NO ORGANISMS SEEN    Culture   Final    NO GROWTH 4 DAYS NO ANAEROBES ISOLATED; CULTURE IN PROGRESS FOR 5 DAYS   Report Status PENDING  Incomplete    Impression/Plan:  1. 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  Will need at least 6 weeks IV therapy.   2. Substance abuse - denies any recent substance abuse but significant history.  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    I will follow up intermittently

## 2016-12-29 NOTE — Progress Notes (Signed)
Referring Physician(s): Dr. Tressie Stalkerlarence Owen  Supervising Physician: Ruel FavorsShick, Trevor  [facility]  Chief Complaint: Left empyema  Subjective: Patient with no new complaints.  Spoke to TCTS who has requested that both chest tubes be removed as he is having minimal serous output from both.  Allergies: Bee venom; Voltaren [diclofenac sodium]; and Latex  Medications: Prior to Admission medications   Medication Sig Start Date End Date Taking? Authorizing Provider  cephALEXin (KEFLEX) 500 MG capsule Take 500 mg by mouth 4 (four) times daily. 12/15/16  Yes [provider]  HYDROcodone-acetaminophen (NORCO/VICODIN) 5-325 MG tablet Take 1 tablet by mouth every 4 (four) hours as needed. for pain 12/17/16  Yes [provider]  Insulin Glargine (TOUJEO MAX SOLOSTAR) 300 UNIT/ML SOPN Inject 40 Units into the skin daily.   Yes [provider]  metFORMIN (GLUCOPHAGE) 500 MG tablet Take 500 mg by mouth 2 (two) times daily with a meal.   Yes [provider]    Vital Signs: BP 128/78 (BP Location: Left Arm)   Pulse 76   Temp 97.7 F (36.5 C) (Oral)   Resp 19   Ht 5\' 11"  (1.803 m)   Wt 237 lb 11.2 oz (107.8 kg)   SpO2 98%   BMI 33.15 kg/m   Physical Exam: Chest: both chest tubes were removed with no complications at time of removal.  Occlusive dressing was placed over each site.  Imaging: Dg Chest 2 View  Result Date: 12/29/2016 CLINICAL DATA:  Empyema. EXAM: CHEST  2 VIEW COMPARISON:  Radiograph of December 27, 2016.  CT scan of December 23, 2016. FINDINGS: The heart size and mediastinal contours are within normal limits. No pneumothorax is noted. Stable right apical densities are noted most consistent with pneumonia or other inflammation. Two left-sided pleural drainage catheters are again noted and unchanged. Stable small loculated pleural effusions are noted along the left lateral chest wall. The visualized skeletal structures are unremarkable.  IMPRESSION: Stable position of 2 left-sided pleural drainage catheters, with stable small loculated pleural effusions noted along left lateral chest wall. Stable right apical densities are noted most consistent with pneumonia or other inflammation. Electronically Signed   By: Lupita RaiderJames  Green Jr, M.D.   On: 12/29/2016 07:34   Dg Chest Port 1 View  Result Date: 12/27/2016 CLINICAL DATA:  Empyema EXAM: PORTABLE CHEST 1 VIEW COMPARISON:  Two days ago FINDINGS: Two left-sided pigtail catheters. The more superior catheter is more inferiorly positioned today. Small volume left chest with unchanged loculation at the apex. Right chest remains clear. Heart size is accentuated by loculations along the left heart border that appear diminished from prior chest x-ray. IMPRESSION: 1. Apparent decrease in pleural collections along the left heart border. 2. Stable loculated pleural thickening and fluid at the left apex. An associated pleural catheter is more inferiorly positioned today. Electronically Signed   By: Marnee SpringJonathon  Watts M.D.   On: 12/27/2016 08:24    Labs:  CBC:  Recent Labs  12/23/16 0401 12/24/16 0634 12/25/16 0330 12/29/16 0456  WBC 19.7* 20.1* 20.2* 13.1*  HGB 9.8* 9.8* 9.6* 11.9*  HCT 30.0* 30.3* 29.9* 36.7*  PLT 602* 619* 580* 789*    COAGS:  Recent Labs  12/23/16 0755 12/29/16 0456  INR 1.20 1.03    BMP:  Recent Labs  12/26/16 0259 12/27/16 0234 12/28/16 0204 12/29/16 0456  NA 132* 136 136 135  K 4.1 4.2 4.4 4.1  CL 96* 101 101 96*  CO2 27 25  27 30  GLUCOSE 307* 223* 160* 91  BUN 6 10 8 9   CALCIUM 8.4* 8.7* 9.0 9.3  CREATININE 0.48* 0.44* 0.45* 0.52*  GFRNONAA >60 >60 >60 >60  GFRAA >60 >60 >60 >60    LIVER FUNCTION TESTS:  Recent Labs  12/20/16 0909 12/29/16 0456  BILITOT  --  0.5  AST  --  23  ALT  --  53  ALKPHOS  --  101  PROT 5.9* 7.7  ALBUMIN  --  2.5*    Assessment and Plan: 1. Left empyema, s/p chest tube placement x2  Both chest tubes have  been removed at the request of Dr. Cornelius Moras.  No complications.  Follow up CXR pending.  No further IR needs.  Will sign off.  Electronically Signed: Letha Cape 12/29/2016, 3:13 PM   I spent a total of 15 Minutes at the the patient's bedside AND on the patient's hospital floor or unit, greater than 50% of which was counseling/coordinating care for left empyema

## 2016-12-30 ENCOUNTER — Inpatient Hospital Stay (HOSPITAL_COMMUNITY): Payer: Self-pay

## 2016-12-30 DIAGNOSIS — J90 Pleural effusion, not elsewhere classified: Secondary | ICD-10-CM | POA: Diagnosis not present

## 2016-12-30 LAB — GLUCOSE, CAPILLARY
Glucose-Capillary: 96 mg/dL (ref 65–99)
Glucose-Capillary: 181 mg/dL — ABNORMAL HIGH (ref 65–99)
Glucose-Capillary: 158 mg/dL — ABNORMAL HIGH (ref 65–99)
Glucose-Capillary: 97 mg/dL (ref 65–99)

## 2016-12-30 MED ORDER — INSULIN ASPART 100 UNIT/ML ~~LOC~~ SOLN
5.0000 [IU] | Freq: Three times a day (TID) | SUBCUTANEOUS | Status: DC
  Administered 2016-12-30 – 2017-01-05 (×17): 5 [IU] via SUBCUTANEOUS

## 2016-12-30 MED ORDER — MORPHINE SULFATE (PF) 2 MG/ML IV SOLN
2.0000 mg | INTRAVENOUS | Status: DC | PRN
  Administered 2016-12-30 – 2017-01-06 (×32): 4 mg via INTRAVENOUS
  Filled 2016-12-30 (×16): qty 2
  Filled 2016-12-30: qty 1
  Filled 2016-12-30 (×17): qty 2

## 2016-12-30 NOTE — Progress Notes (Addendum)
      [address]       [name] [id number]             [phone]           Subjective: Patient with pain at left back but states his breathing is fine.  Objective: Vital signs in last 24 hours: Temp:  [97.7 F (36.5 C)-98.4 F (36.9 C)] 97.7 F (36.5 C) (07/24 0434) Pulse Rate:  [84-94] 88 (07/24 0434) Cardiac Rhythm: Normal sinus rhythm (07/24 0700) Resp:  [14-18] 18 (07/24 0434) BP: (121-135)/(71-81) 127/76 (07/24 0434) SpO2:  [95 %-98 %] 98 % (07/24 0434)      Intake/Output from previous day: 07/23 0701 - 07/24 0700 In: 1557.2 [P.O.:1440; IV Piggyback:117.2] Out: 3095 [Urine:3095]   Physical Exam:  Cardiovascular: RRR Pulmonary: Clear to auscultation on the right and slightly diminished left base. Wounds: Clean and dry.  No erythema or signs of infection.   Lab Results: CBC: Recent Labs  12/29/16 0456  WBC 13.1*  HGB 11.9*  HCT 36.7*  PLT 789*   BMET:  Recent Labs  12/28/16 0204 12/29/16 0456  NA 136 135  K 4.4 4.1  CL 101 96*  CO2 27 30  GLUCOSE 160* 91  BUN 8 9  CREATININE 0.45* 0.52*  CALCIUM 9.0 9.3    PT/INR:  Recent Labs  12/29/16 0456  LABPROT 13.5  INR 1.03   ABG:  INR: Will add last result for INR, ABG once components are confirmed Will add last 4 CBG results once components are confirmed  Assessment/Plan:  1. CV - SR in the 80's. 2.  Pulmonary - S/p chest tube removal yesterday.CXR ordered but not taken yet. Will follow up on CXR. 3. ID-On Daptomycin and Doxycycline. He needs a total of 6 weeks of IV antibiotics. 4. DM-CBGs 87/158/96. On Insulin. Per primary  ZIMMERMAN,DONIELLE MPA-C 12/30/2016,8:04 AM Patient seen and examined, agree with above CXR pending this AM, still has a small loculated fluid collection on the left  Conway SpringsSteven C. Dorris FetchHendrickson, MD [facility] [phone]

## 2016-12-30 NOTE — Progress Notes (Signed)
[facility] Progress Note  Patient: Keith Riggs ZOX:[id number]   PCP: System, Pcp Not In DOB: 01-06-1991   DOA: 12/19/2016   DOS: 12/30/2016   Date of Service: the patient was seen and examined on 12/30/2016  Subjective: Patient's pain remains well controlled. No nausea no vomiting. Still has some back pain as well as leg pain. No diarrhea no constipation. No cough.  Brief hospital course: 26 y/o with IDDM, past history of heroin abuse, cocaine abuse who get random drug tests 2 x a month. He is a transfer from [facility] ER. History obtained from Grandmother and father whom he lives with.  He was seen there for a right groin abscess on Wed which was I and D'd. He was seen again on Friday for a packing of the abscess and was found to have a left groin abscess with was I and D'd. He returned to there ER in the after noon because he thought his packing had fallen out. At that time, he complained of back pain and was sent to [facility] for and MRI. This showed thoracic epidural abscesses causing severe spinal cord compression, paraspinal myositis and abscesses, T6 osteo and b/l pleural effusions. Neurosurgery was consulted initially recommended conservative management. ID as well as cardiology thoracic surgery were consulted as well as interventional radiology for chest tube placement. Chest tubes have been removed on 12/29/2016, follow-up chest x-ray still shows some small loculated pleural effusion. I did recommend 6 weeks of IV antibiotics. Repeat cultures so far have been negative.  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   Osteomyelitis of thoracic spine   Acute pyelonephritis   Soft tissue abscess of inguinal region- bilaterally  - lactic acid normal, Wbc 26, fever 100.3, tachycardia - Neurosurgery was consulted- recommend  conservative management- cont Neuro checks every 4 hrs- re-call NS if any neurological changes - culture from wound on 7/11 is showing Strep Algactiae - blood culture from [facility] with gram + cocci in clusters - he has been on Keflex at home - pleural fluid>> MRSA - blood culture 7/14 >> MRSA -  ID consulted  - 2 D ECHO negative for vegetation- no need for TEE as he will need a prolonged course of antibiotic anyway - 7/16- Vanc trough < 4,  discussed this with ID, requested Daptomycin which was started on 7/16  - 7/17- Doxy started for better lung penetration than Dapto - WBC counts - trending down - Patient will need 6 weeks of IV antibiotics, social worker consulted for SNF, patient currently agreeable to go to SNF to complete the antibiotic course. Awaiting recommendation for PICC line placement from ID.    Left sided loculated effusion and septic pulmonary emboli - related to above  - strep pneumo neg - CT surgery consulted- not a VATS candidate at this time- recommended IR consult for pigtail catheter- IR consulted and drain placed on 7/14 - per CT surgery, he needs a second drain in left upper chest- IR consulted again, tolerated procedure , repeat chest x-ray shows improvement in collection volume. Chest tubes have been removed. Repeat chest x-ray is showing small left pneumothorax with small loculation. Monitor recommendation from cardiology thoracic surgery.  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 - Currently liberalizing fluid restriction.  Hypokalemia/  hypomagnesemia - Stable, daily monitoring    DM2 (diabetes mellitus, type 2) - cont Lantus and SSI- dose of Lantus increased to 50 units Changing sliding scale back to moderate from resistant as well as reducing the scheduled insulin from 8 units of 5 units.    IVDU (intravenous drug user) - has used IV Cocaine and Heroin in the past -  states he has not used in 2-3 months - grandmother states he get random drug tests 2 x month - UDS shows Opiates which he is receiving int he hospital   Cigarette smoker - Nicotine patch    Normocytic anemia - anemia >> Low Iron levels and normal Iron binding, retic count normal- Ferritin high likely due to infection - consistent with AOCD  Morbid Obesity  Body mass index is 33.05 kg/m.  Pain management. Patient was placed on scheduled OxyIR 6 times a day as well as on Dilaudid 0.5-1 mg every 2-4 hours as needed. On my assessment the patient was drowsy and sleepy and lethargic. At present I do not think the patient notes a scheduled narcotic medication. We change it to when necessary Percocet. Now that the chest tube has been removed, I would change IV Dilaudid to IV morphine. Placing the patient on scheduled Toradol which I would currently hold as well. If the pain is not well-controlled I would reintroduce his medication. Patient continues to have some back pain.flexeril added scheduled basis.   Diet: regular diet DVT Prophylaxis: subcutaneous Heparin  Advance goals of care discussion: full code  Family Communication: no family was present at bedside, at the time of interview.   Disposition:  Discharge to SNF as the patient will require IV antibiotics and has history of IV drug abuse.  Consultants: ID, neurosurgery, CT surgery, nephrology, IR Procedures: IR guided drain placement, left chest tube 7/14, drainage catheter placement 7/18  Antibiotics: Anti-infectives    Start     Dose/Rate Route Frequency Ordered Stop   12/23/16 1230  doxycycline (VIBRA-TABS) tablet 100 mg     100 mg Oral Every 12 hours 12/23/16 1203     12/23/16 1200  DAPTOmycin (CUBICIN) 860 mg in sodium chloride 0.9 % IVPB     8 mg/kg  107.5 kg 234.4 mL/hr over 30 Minutes Intravenous Every 24 hours 12/22/16 1858     12/22/16 1230  DAPTOmycin (CUBICIN) 860 mg in sodium chloride 0.9 % IVPB     8  mg/kg  107.5 kg 234.4 mL/hr over 30 Minutes Intravenous Every 24 hours 12/22/16 1151 12/22/16 1337   12/21/16 2200  vancomycin (VANCOCIN) IVPB 1000 mg/200 mL premix  Status:  Discontinued     1,000 mg 200 mL/hr over 60 Minutes Intravenous Every 6 hours 12/21/16 2110 12/22/16 1850   12/21/16 2000  vancomycin (VANCOCIN) IVPB 1000 mg/200 mL premix  Status:  Discontinued     1,000 mg 200 mL/hr over 60 Minutes Intravenous Every 8 hours 12/21/16 1339 12/21/16 2109   12/20/16 1300  piperacillin-tazobactam (ZOSYN) IVPB 3.375 g  Status:  Discontinued     3.375 g 12.5 mL/hr over 240 Minutes Intravenous Every 8 hours 12/20/16 1109 12/20/16 1214   12/20/16 1300  cefTRIAXone (ROCEPHIN) 2 g in dextrose 5 % 50 mL IVPB  Status:  Discontinued     2 g 100 mL/hr over 30 Minutes Intravenous Every 24 hours 12/20/16 1214 12/22/16 2001   12/20/16 0200  vancomycin (VANCOCIN) IVPB 1000 mg/200 mL premix  Status:  Discontinued     1,000 mg 200 mL/hr  over 60 Minutes Intravenous Every 8 hours 12/20/16 0024 12/21/16 1339   12/20/16 0200  piperacillin-tazobactam (ZOSYN) IVPB 3.375 g  Status:  Discontinued     3.375 g 12.5 mL/hr over 240 Minutes Intravenous Every 8 hours 12/20/16 0024 12/20/16 1109       Objective: Physical Exam: Vitals:   12/29/16 1929 12/30/16 0003 12/30/16 0434 12/30/16 0856  BP: 121/71 135/81 127/76 128/71  Pulse: 94 84 88 96  Resp: 17 14 18    Temp: 97.9 F (36.6 C) 98.4 F (36.9 C) 97.7 F (36.5 C) 98.4 F (36.9 C)  TempSrc: Oral Oral Oral Oral  SpO2: 98% 95% 98% 100%  Weight:      Height:        Intake/Output Summary (Last 24 hours) at 12/30/16 1425 Last data filed at 12/30/16 1118  Gross per 24 hour  Intake           1197.2 ml  Output             2520 ml  Net          -1322.8 ml   Filed Weights   12/19/16 2324 12/28/16 0300 12/29/16 0457  Weight: 107.5 kg (237 lb) 109.9 kg (242 lb 3.2 oz) 107.8 kg (237 lb 11.2 oz)   General: alert and Oriented to Time, Place and Person.  Appear in mild distress, affect appropriate Eyes: PERRL, Conjunctiva normal ENT: Oral Mucosa clear moist. Neck: no JVD, no Abnormal Mass Or lumps Cardiovascular: S1 and S2 Present, no Murmur, Peripheral Pulses Present Respiratory: normal respiratory effort, Bilateral Air entry equal and Decreased, no use of accessory muscle, basal Crackles, no wheezes Abdomen: Bowel Sound present, Soft and no tenderness, no hernia Skin: no redness, no Rash, no induration Extremities: no Pedal edema, no calf tenderness Neurologic: Grossly no focal neuro deficit. Bilaterally Equal motor strength  Data Reviewed: CBC:  Recent Labs Lab 12/24/16 0634 12/25/16 0330 12/29/16 0456  WBC 20.1* 20.2* 13.1*  NEUTROABS  --   --  9.1*  HGB 9.8* 9.6* 11.9*  HCT 30.3* 29.9* 36.7*  MCV 82.6 82.8 84.0  PLT 619* 580* 789*   Basic Metabolic Panel:  Recent Labs Lab 12/25/16 0330 12/26/16 0259 12/27/16 0234 12/28/16 0204 12/29/16 0456  NA 127* 132* 136 136 135  K 4.0 4.1 4.2 4.4 4.1  CL 91* 96* 101 101 96*  CO2 29 27 25 27 30   GLUCOSE 180* 307* 223* 160* 91  BUN 5* 6 10 8 9   CREATININE 0.48* 0.48* 0.44* 0.45* 0.52*  CALCIUM 8.2* 8.4* 8.7* 9.0 9.3  MG  --   --   --   --  1.8    Liver Function Tests:  Recent Labs Lab 12/29/16 0456  AST 23  ALT 53  ALKPHOS 101  BILITOT 0.5  PROT 7.7  ALBUMIN 2.5*   No results for input(s): LIPASE, AMYLASE in the last 168 hours. No results for input(s): AMMONIA in the last 168 hours. Coagulation Profile:  Recent Labs Lab 12/29/16 0456  INR 1.03   Cardiac Enzymes:  Recent Labs Lab 12/29/16 0456  CKTOTAL 24*   BNP (last 3 results) No results for input(s): PROBNP in the last 8760 hours. CBG:  Recent Labs Lab 12/29/16 1152 12/29/16 1620 12/29/16 2053 12/30/16 0610 12/30/16 1117  GLUCAP 212* 87 158* 96 181*   Studies: Dg Chest 2 View  Result Date: 12/30/2016 CLINICAL DATA:  Chest tube removal EXAM: CHEST  2 VIEW COMPARISON:  Portable chest  x-ray of 12/29/2016  and CT chest of 12/23/2016 FINDINGS: A small amount of pleural air is noted peripherally in the left mid upper hemithorax. Adjacent pleural thickening consistent with loculated fluid remains as well. Mild left basilar linear atelectasis is present. There may be a tiny right pleural effusion. The nodule in the right upper lung field is again visualized. Heart size is stable. No acute bony abnormality is seen. IMPRESSION: 1. Suspect small left pneumothorax with a small amount of pleural air noted. 2. Loculated pleural fluid again noted with small effusions bilaterally. 3. No change in right upper lobe lung nodule. Electronically Signed   By: Dwyane DeePaul  Barry M.D.   On: 12/30/2016 08:58   Dg Chest Port 1 View  Result Date: 12/29/2016 CLINICAL DATA:  Post chest tube removal film EXAM: PORTABLE CHEST - 1 VIEW COMPARISON:  Earlier film of the same day a FINDINGS: 2 left pigtail catheters have been removed. No pneumothorax. Persistent left lateral pleural thickening or loculated effusion. Focal right upper lobe airspace disease persists. Heart size and mediastinal contours are within normal limits. Visualized bones unremarkable. IMPRESSION: 1. Removal of 2 left chest tubes with no pneumothorax. 2. Residual left pleural thickening or loculated effusion. 3. Focal airspace infiltrate in the right upper lobe as before. Electronically Signed   By: Corlis Leak  Hassell M.D.   On: 12/29/2016 15:40    Scheduled Meds: . cyclobenzaprine  5 mg Oral TID  . doxycycline  100 mg Oral Q12H  . enoxaparin (LOVENOX) injection  40 mg Subcutaneous Q24H  . insulin aspart  0-15 Units Subcutaneous TID WC  . insulin aspart  0-5 Units Subcutaneous QHS  . insulin aspart  5 Units Subcutaneous TID WC  . insulin glargine  50 Units Subcutaneous Daily  . nicotine  21 mg Transdermal Daily  . polyethylene glycol  17 g Oral Daily  . senna-docusate  2 tablet Oral BID   Continuous Infusions: . DAPTOmycin (CUBICIN)  IV Stopped (12/29/16  1246)   PRN Meds: acetaminophen, bisacodyl, morphine injection, ondansetron (ZOFRAN) IV, oxyCODONE-acetaminophen, zolpidem  Time spent: 30 minutes  Author: Lynden OxfordPranav Leaann Nevils, MD [facility] Pager: [phone] 12/30/2016 2:25 PM  If 7PM-7AM, please contact night-coverage at [email or web address], password [email or web address]

## 2016-12-31 ENCOUNTER — Inpatient Hospital Stay (HOSPITAL_COMMUNITY): Payer: Self-pay

## 2016-12-31 DIAGNOSIS — J90 Pleural effusion, not elsewhere classified: Secondary | ICD-10-CM | POA: Diagnosis not present

## 2016-12-31 DIAGNOSIS — E1165 Type 2 diabetes mellitus with hyperglycemia: Secondary | ICD-10-CM

## 2016-12-31 LAB — CBC WITH DIFFERENTIAL/PLATELET
Basophils Absolute: 0 10*3/uL (ref 0.0–0.1)
Basophils Relative: 0 %
EOS ABS: 0.1 10*3/uL (ref 0.0–0.7)
Eosinophils Relative: 1 %
HCT: 35.5 % — ABNORMAL LOW (ref 39.0–52.0)
HEMOGLOBIN: 11.6 g/dL — AB (ref 13.0–17.0)
LYMPHS ABS: 3.1 10*3/uL (ref 0.7–4.0)
Lymphocytes Relative: 24 %
MCH: 27.5 pg (ref 26.0–34.0)
MCHC: 32.7 g/dL (ref 30.0–36.0)
MCV: 84.1 fL (ref 78.0–100.0)
MONO ABS: 0.7 10*3/uL (ref 0.1–1.0)
MONOS PCT: 6 %
Neutro Abs: 8.7 10*3/uL — ABNORMAL HIGH (ref 1.7–7.7)
Neutrophils Relative %: 69 %
Platelets: 722 10*3/uL — ABNORMAL HIGH (ref 150–400)
RBC: 4.22 MIL/uL (ref 4.22–5.81)
RDW: 14.2 % (ref 11.5–15.5)
WBC: 12.6 10*3/uL — ABNORMAL HIGH (ref 4.0–10.5)

## 2016-12-31 LAB — COMPREHENSIVE METABOLIC PANEL
ALK PHOS: 89 U/L (ref 38–126)
ALT: 34 U/L (ref 17–63)
ANION GAP: 11 (ref 5–15)
AST: 19 U/L (ref 15–41)
Albumin: 2.8 g/dL — ABNORMAL LOW (ref 3.5–5.0)
BILIRUBIN TOTAL: 0.2 mg/dL — AB (ref 0.3–1.2)
BUN: 10 mg/dL (ref 6–20)
CALCIUM: 9.4 mg/dL (ref 8.9–10.3)
CO2: 26 mmol/L (ref 22–32)
Chloride: 98 mmol/L — ABNORMAL LOW (ref 101–111)
Creatinine, Ser: 0.53 mg/dL — ABNORMAL LOW (ref 0.61–1.24)
GFR calc non Af Amer: >60 mL/min (ref >60)
Glucose, Bld: 100 mg/dL — ABNORMAL HIGH (ref 65–99)
POTASSIUM: 4.6 mmol/L (ref 3.5–5.1)
SODIUM: 135 mmol/L (ref 135–145)
TOTAL PROTEIN: 7.7 g/dL (ref 6.5–8.1)

## 2016-12-31 LAB — MAGNESIUM: Magnesium: 1.8 mg/dL (ref 1.7–2.4)

## 2016-12-31 LAB — GLUCOSE, CAPILLARY
GLUCOSE-CAPILLARY: 95 mg/dL (ref 65–99)
GLUCOSE-CAPILLARY: 127 mg/dL — AB (ref 65–99)
GLUCOSE-CAPILLARY: 123 mg/dL — AB (ref 65–99)
GLUCOSE-CAPILLARY: 115 mg/dL — AB (ref 65–99)

## 2016-12-31 NOTE — Progress Notes (Signed)
[facility] PROGRESS NOTE  Keith Riggs JYN:[id number] DOB: 12/03/1990 DOA: 12/19/2016 PCP: System, Pcp Not In  Interim summary and HPI 26 y/o with IDDM, past history of heroin abuse, cocaine abuse who get random drug tests 2 x a month. He is a [facility]. History obtained from Grandmother and father whom he lives with. He was seen there for a right groin abscess on Wed which was I and D'd. He was seen again on Friday for a packing of the abscess and was found to have a left groin abscess with was I and D'd. He returned to ER in the afternoon because he thought his packing had fallen out. At that time, he complained of back pain and was sent [facility] and [facility]. This showed thoracic epidural abscesses causing severe spinal cord compression, paraspinal myositis and abscesses, T6 osteo and b/l pleural effusions. Neurosurgery was consulted initially recommended conservative management. ID as well as cardiolothoracic surgery were consulted as well as interventional radiology for chest tube placement. Chest tubes have been removed on 12/29/2016, follow-up chest x-ray still shows some small loculated pleural effusion. ID recommended 6 weeks of IV antibiotics. Repeat cultures so far have been negative. Will place PICC. Needs to go to SNF.  Assessment/Plan: 1-sepsis due to disseminated MRSA: patient with bacteremia, epidural abscess (thoracic spine), T6 osteomyelitis, inguinal abscess bilaterally: -afebrile and with WBC's trending down appropriately -will continue IV Daptomycin and doxycycline; planning antibiotic therapy until 8/28 (to complete 6 weeks of IV antibiotics; after that planning for prolonged oral suppression using doxycycline) -will follow rec's -will place PICC -repeat blood cx's has remained neg -SW on board to assist with SNF placement -given hx of IV drug use and need for PICC  2-hyponatremia -suspected to be due to SIADH; resolved with fluid  restriction -most likely associated with acute infection and septic emboli -will monitor electrolytes  3-hypokalemia/hypomagnesemia -will monitor electrolytes and replete as needed   4-type 2 diabetes -continue lantus and SSI  5-tobacco abuse -continue nicotine patch -cessation counsleing provided  6-IVDU: heroin and cocaine -patient reports being clean for 2 months now -UDS positive for opiates (but received narcotics in ED) -cessation counseling provided -given hx and high risk, PICC will be place and patient needs to go to control environment (SNF) for IV antibiotics.  7-obesity -Body mass index is 33.15 kg/m. -low calorie diet and exercise discussed  8-pain management  -will continue current pain medication regimen  -on percocet and morphine PRN. -continue also flexeril   Code Status: Full Family Communication: father at bedside  Disposition Plan: remains inpatient, continue IV antibiotics, follow CVTS and ID recommendations. Will need SNF. Will insert PICC line   Consultants:  IR  CVTS  ID  Neurosurgery   Nephrology (consulted for hyponatremia)  Procedures:  See below for x-ray reports   Paraspinal percutaneous drainage by IR  Chest tube by CVTS  Antibiotics:  Daptomycin and doxycycline   HPI/Subjective: Reports pain in his back continues intermittently. No nausea, no vomiting and no fever.  Objective: Vitals:   12/30/16 0856 12/30/16 2205  BP: 128/71 127/71  Pulse: 96   Resp:  13  Temp: 98.4 F (36.9 C) 98.9 F (37.2 C)    Intake/Output Summary (Last 24 hours) at 12/31/16 0855 Last data filed at 12/31/16 0456  Gross per 24 hour  Intake              117 ml  Output  1850 ml  Net            -1733 ml   Filed Weights   12/19/16 2324 12/28/16 0300 12/29/16 0457  Weight: 107.5 kg (237 lb) 109.9 kg (242 lb 3.2 oz) 107.8 kg (237 lb 11.2 oz)    Exam:   General: afebrile, reports pain in his back; no nausea, no vomiting, no  abd pain and no SOB.  Cardiovascular: RRR, no rubs, no gallops, no JVD  Respiratory: good air movement, no wheezing, no crackles, scattered rhonchi appreciated  Abdomen: soft, NT, ND, positive BS  Musculoskeletal: no edema, no cyanosis or clubbing    Data Reviewed: Basic Metabolic Panel:  Recent Labs Lab 12/26/16 0259 12/27/16 0234 12/28/16 0204 12/29/16 0456 12/31/16 0432  NA 132* 136 136 135 135  K 4.1 4.2 4.4 4.1 4.6  CL 96* 101 101 96* 98*  CO2 27 25 27 30 26   GLUCOSE 307* 223* 160* 91 100*  BUN 6 10 8 9 10   CREATININE 0.48* 0.44* 0.45* 0.52* 0.53*  CALCIUM 8.4* 8.7* 9.0 9.3 9.4  MG  --   --   --  1.8 1.8   Liver Function Tests:  Recent Labs Lab 12/29/16 0456 12/31/16 0432  AST 23 19  ALT 53 34  ALKPHOS 101 89  BILITOT 0.5 0.2*  PROT 7.7 7.7  ALBUMIN 2.5* 2.8*   No results for input(s): LIPASE, AMYLASE in the last 168 hours. No results for input(s): AMMONIA in the last 168 hours. CBC:  Recent Labs Lab 12/25/16 0330 12/29/16 0456 12/31/16 0432  WBC 20.2* 13.1* 12.6*  NEUTROABS  --  9.1* 8.7*  HGB 9.6* 11.9* 11.6*  HCT 29.9* 36.7* 35.5*  MCV 82.8 84.0 84.1  PLT 580* 789* 722*   Cardiac Enzymes:  Recent Labs Lab 12/29/16 0456  CKTOTAL 24*   BNP (last 3 results) No results for input(s): BNP in the last 8760 hours.  ProBNP (last 3 results) No results for input(s): PROBNP in the last 8760 hours.  CBG:  Recent Labs Lab 12/30/16 0610 12/30/16 1117 12/30/16 1636 12/30/16 2203 12/31/16 0646  GLUCAP 96 181* 158* 97 95    Recent Results (from the past 240 hour(s))  Culture, blood (routine x 2)     Status: None   Collection Time: 12/24/16  6:34 AM  Result Value Ref Range Status   Specimen Description BLOOD LEFT HAND  Final   Special Requests IN PEDIATRIC BOTTLE Blood Culture adequate volume  Final   Culture NO GROWTH 5 DAYS  Final   Report Status 12/29/2016 FINAL  Final  Culture, blood (routine x 2)     Status: None   Collection  Time: 12/24/16  6:34 AM  Result Value Ref Range Status   Specimen Description BLOOD RIGHT HAND  Final   Special Requests   Final    BOTTLES DRAWN AEROBIC ONLY Blood Culture adequate volume   Culture NO GROWTH 5 DAYS  Final   Report Status 12/29/2016 FINAL  Final  Aerobic/Anaerobic Culture (surgical/deep wound)     Status: None   Collection Time: 12/24/16  3:45 PM  Result Value Ref Range Status   Specimen Description ABSCESS LEFT PLEURAL  Final   Special Requests Normal  Final   Gram Stain   Final    FEW WBC PRESENT,BOTH PMN AND MONONUCLEAR NO ORGANISMS SEEN    Culture No growth aerobically or anaerobically.  Final   Report Status 12/29/2016 FINAL  Final     Studies: Dg Chest  2 View  Result Date: 12/30/2016 CLINICAL DATA:  Chest tube removal EXAM: CHEST  2 VIEW COMPARISON:  Portable chest x-ray of 12/29/2016 and CT chest of 12/23/2016 FINDINGS: A small amount of pleural air is noted peripherally in the left mid upper hemithorax. Adjacent pleural thickening consistent with loculated fluid remains as well. Mild left basilar linear atelectasis is present. There may be a tiny right pleural effusion. The nodule in the right upper lung field is again visualized. Heart size is stable. No acute bony abnormality is seen. IMPRESSION: 1. Suspect small left pneumothorax with a small amount of pleural air noted. 2. Loculated pleural fluid again noted with small effusions bilaterally. 3. No change in right upper lobe lung nodule. Electronically Signed   By: Dwyane DeePaul  Barry M.D.   On: 12/30/2016 08:58   Dg Chest Port 1 View  Result Date: 12/29/2016 CLINICAL DATA:  Post chest tube removal film EXAM: PORTABLE CHEST - 1 VIEW COMPARISON:  Earlier film of the same day a FINDINGS: 2 left pigtail catheters have been removed. No pneumothorax. Persistent left lateral pleural thickening or loculated effusion. Focal right upper lobe airspace disease persists. Heart size and mediastinal contours are within normal limits.  Visualized bones unremarkable. IMPRESSION: 1. Removal of 2 left chest tubes with no pneumothorax. 2. Residual left pleural thickening or loculated effusion. 3. Focal airspace infiltrate in the right upper lobe as before. Electronically Signed   By: Corlis Leak  Hassell M.D.   On: 12/29/2016 15:40    Scheduled Meds: . cyclobenzaprine  5 mg Oral TID  . doxycycline  100 mg Oral Q12H  . enoxaparin (LOVENOX) injection  40 mg Subcutaneous Q24H  . insulin aspart  0-15 Units Subcutaneous TID WC  . insulin aspart  0-5 Units Subcutaneous QHS  . insulin aspart  5 Units Subcutaneous TID WC  . insulin glargine  50 Units Subcutaneous Daily  . nicotine  21 mg Transdermal Daily  . polyethylene glycol  17 g Oral Daily  . senna-docusate  2 tablet Oral BID   Continuous Infusions: . DAPTOmycin (CUBICIN)  IV Stopped (12/30/16 1836)    Principal Problem:   Sepsis due to methicillin resistant Staphylococcus aureus (MRSA) (HCC) Active Problems:   DM2 (diabetes mellitus, type 2) (HCC)   IVDU (intravenous drug user)   DKA (diabetic ketoacidoses) (HCC)   Polysubstance abuse   Pleural effusion, bilateral   Osteomyelitis of thoracic spine (HCC)   Abscess in epidural space of thoracic spine   Soft tissue abscess of inguinal region   Cigarette smoker   Normocytic anemia   Acute pyelonephritis    Time spent: 35 minutes    Vassie LollMadera, Mirca Yale  [facility] Pager [phone]. If 7PM-7AM, please contact night-coverage at [email or web address], password [facility] 12/31/2016, 8:55 AM  LOS: 11 days

## 2016-12-31 NOTE — Progress Notes (Signed)
Clinical Social Worker is following patient for SNF placement.   Marrianne MoodAshley Kaylena Pacifico, MSW,  [employer]CSWA [phone]

## 2016-12-31 NOTE — Progress Notes (Addendum)
      [address]       [name] [id number]             [phone]           Subjective: Patient with pain in the middle of his back but states his breathing is fine.  Objective: Vital signs in last 24 hours: Temp:  [98.4 F (36.9 C)-98.9 F (37.2 C)] 98.9 F (37.2 C) (07/24 2205) Pulse Rate:  [96] 96 (07/24 0856) Cardiac Rhythm: Normal sinus rhythm (07/24 2349) Resp:  [13] 13 (07/24 2205) BP: (127-128)/(71) 127/71 (07/24 2205) SpO2:  [99 %-100 %] 99 % (07/24 2205)     Intake/Output from previous day: 07/24 0701 - 07/25 0700 In: 357 [P.O.:240; IV Piggyback:117] Out: 1850 [Urine:1850]   Physical Exam:  Cardiovascular: RRR Pulmonary: Clear to auscultation on the right and slightly diminished left base. Wounds: Clean and dry.     Lab Results: CBC:  Recent Labs  12/29/16 0456 12/31/16 0432  WBC 13.1* 12.6*  HGB 11.9* 11.6*  HCT 36.7* 35.5*  PLT 789* 722*   BMET:   Recent Labs  12/29/16 0456 12/31/16 0432  NA 135 135  K 4.1 4.6  CL 96* 98*  CO2 30 26  GLUCOSE 91 100*  BUN 9 10  CREATININE 0.52* 0.53*  CALCIUM 9.3 9.4    PT/INR:   Recent Labs  12/29/16 0456  LABPROT 13.5  INR 1.03   ABG:  INR: Will add last result for INR, ABG once components are confirmed Will add last 4 CBG results once components are confirmed  Assessment/Plan:  1. CV - SR in the 80's. 2.  Pulmonary - CXR now reviewed and appears stable (small, loculated left pleural effusion,ptx) 3. ID-On Daptomycin and Doxycycline for disseminated MRSA infection. He needs a total of 6 weeks of IV antibiotics. 4. DM-CBGs 158/97/95. On Insulin. Per primary 5. I have arranged a follow up appointment  ZIMMERMAN,DONIELLE MPA-C 12/31/2016,7:15 AM  Agree with above assessment and plan I have personally reviewed his last chest x-ray which shows a small amount of pleural effusion-pleural thickening and is stable Continue antibiotics as planned Kathlee NationsPeter Van trigt  M.D.

## 2016-12-31 NOTE — Progress Notes (Signed)
    [facility]   Reason for visit: Follow up on MRSA disseminated infection  Interval History: no fever, no chills, WBC 13.1.  No associated n/v/d.  No rashes.  Main complaint is back and pain at chest tube sites.  Day 13 antibiotics daptomycin day 10 Doxycyline day 9  Physical Exam: Constitutional:  Vitals:   12/31/16 0800 12/31/16 1200  BP: 108/69 (!) 150/79  Pulse: 97 93  Resp: 18 (!) 27  Temp: 99.2 F (37.3 C) 98.4 F (36.9 C)   patient appears in NAD Respiratory: Normal respiratory effort; CTA B Cardiovascular: RRR GI: soft, nt, nd  Review of Systems: Constitutional: negative for fevers and chills Respiratory: negative for cough or sputum Gastrointestinal: negative for nausea  Lab Results  Component Value Date   WBC 12.6 (H) 12/31/2016   HGB 11.6 (L) 12/31/2016   HCT 35.5 (L) 12/31/2016   MCV 84.1 12/31/2016   PLT 722 (H) 12/31/2016    Lab Results  Component Value Date   CREATININE 0.53 (L) 12/31/2016   BUN 10 12/31/2016   NA 135 12/31/2016   K 4.6 12/31/2016   CL 98 (L) 12/31/2016   CO2 26 12/31/2016    Lab Results  Component Value Date   ALT 34 12/31/2016   AST 19 12/31/2016   ALKPHOS 89 12/31/2016     Microbiology: Recent Results (from the past 240 hour(s))  Culture, blood (routine x 2)     Status: None   Collection Time: 12/24/16  6:34 AM  Result Value Ref Range Status   Specimen Description BLOOD LEFT HAND  Final   Special Requests IN PEDIATRIC BOTTLE Blood Culture adequate volume  Final   Culture NO GROWTH 5 DAYS  Final   Report Status 12/29/2016 FINAL  Final  Culture, blood (routine x 2)     Status: None   Collection Time: 12/24/16  6:34 AM  Result Value Ref Range Status   Specimen Description BLOOD RIGHT HAND  Final   Special Requests   Final    BOTTLES DRAWN AEROBIC ONLY Blood Culture adequate volume   Culture NO GROWTH 5 DAYS  Final   Report Status 12/29/2016 FINAL  Final  Aerobic/Anaerobic Culture  (surgical/deep wound)     Status: None   Collection Time: 12/24/16  3:45 PM  Result Value Ref Range Status   Specimen Description ABSCESS LEFT PLEURAL  Final   Special Requests Normal  Final   Gram Stain   Final    FEW WBC PRESENT,BOTH PMN AND MONONUCLEAR NO ORGANISMS SEEN    Culture No growth aerobically or anaerobically.  Final   Report Status 12/29/2016 FINAL  Final    Impression/Plan:  1. 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  Could not get therapeutic with vancomycin.   Will need at least 6 weeks IV therapy through August 28th. Ok from ID standpoint to put in picc line if going to SNF Likely will need prolonged oral therapy after the 6 weeks of daptomycin/doxycycline   2. Substance abuse - denies any recent substance abuse but significant history.  Going to a SNF.

## 2016-12-31 NOTE — Progress Notes (Signed)
PICC line will be placed tomorrow,per PICC line team

## 2017-01-01 ENCOUNTER — Inpatient Hospital Stay (HOSPITAL_COMMUNITY): Payer: Self-pay

## 2017-01-01 LAB — BASIC METABOLIC PANEL
Anion gap: 12 (ref 5–15)
BUN: 13 mg/dL (ref 6–20)
CHLORIDE: 96 mmol/L — AB (ref 101–111)
CO2: 25 mmol/L (ref 22–32)
Calcium: 9.7 mg/dL (ref 8.9–10.3)
Creatinine, Ser: 0.56 mg/dL — ABNORMAL LOW (ref 0.61–1.24)
GFR calc Af Amer: >60 mL/min (ref >60)
GFR calc non Af Amer: >60 mL/min (ref >60)
GLUCOSE: 92 mg/dL (ref 65–99)
POTASSIUM: 4.3 mmol/L (ref 3.5–5.1)
SODIUM: 133 mmol/L — AB (ref 135–145)

## 2017-01-01 LAB — GLUCOSE, CAPILLARY
GLUCOSE-CAPILLARY: 98 mg/dL (ref 65–99)
GLUCOSE-CAPILLARY: 118 mg/dL — AB (ref 65–99)
Glucose-Capillary: 216 mg/dL — ABNORMAL HIGH (ref 65–99)
Glucose-Capillary: 78 mg/dL (ref 65–99)

## 2017-01-01 LAB — CBC
HEMATOCRIT: 36.1 % — AB (ref 39.0–52.0)
HEMOGLOBIN: 11.1 g/dL — AB (ref 13.0–17.0)
MCH: 25.8 pg — AB (ref 26.0–34.0)
MCHC: 30.7 g/dL (ref 30.0–36.0)
MCV: 84 fL (ref 78.0–100.0)
Platelets: 665 10*3/uL — ABNORMAL HIGH (ref 150–400)
RBC: 4.3 MIL/uL (ref 4.22–5.81)
RDW: 14.6 % (ref 11.5–15.5)
WBC: 10.6 10*3/uL — ABNORMAL HIGH (ref 4.0–10.5)

## 2017-01-01 MED ORDER — SODIUM CHLORIDE 0.9% FLUSH
10.0000 mL | INTRAVENOUS | Status: DC | PRN
  Administered 2017-01-02 – 2017-01-06 (×4): 10 mL
  Filled 2017-01-01 (×4): qty 40

## 2017-01-01 NOTE — Progress Notes (Signed)
Peripherally Inserted Central Catheter/Midline Placement  The IV Nurse has discussed with the patient and/or persons authorized to consent for the patient, the purpose of this procedure and the potential benefits and risks involved with this procedure.  The benefits include less needle sticks, lab draws from the catheter, and the patient may be discharged home with the catheter. Risks include, but not limited to, infection, bleeding, blood clot (thrombus formation), and puncture of an artery; nerve damage and irregular heartbeat and possibility to perform a PICC exchange if needed/ordered by physician.  Alternatives to this procedure were also discussed.  B[employer] Power PICC patient education guide, fact sheet on infection prevention and patient information card has been provided to patient /or left at bedside.    PICC/Midline Placement Documentation        Keith Riggs, Keith Riggs 01/01/2017, 11:44 AM

## 2017-01-01 NOTE — Progress Notes (Signed)
      [address]       [name]             [phone]           Subjective: Patient continue to have pain in the middle of his back but states he has no breathing problems.  Objective: Vital signs in last 24 hours: Temp:  [98.3 F (36.8 C)-99.6 F (37.6 C)] 98.6 F (37 C) (07/26 0423) Pulse Rate:  [88-116] 88 (07/26 0423) Cardiac Rhythm: Normal sinus rhythm (07/25 2136) Resp:  [14-27] 18 (07/26 0423) BP: (90-150)/(66-79) 90/66 (07/26 0423) SpO2:  [92 %-98 %] 95 % (07/26 0423)     Intake/Output from previous day: 07/25 0701 - 07/26 0700 In: 1055 [P.O.:1055] Out: 600 [Urine:600]   Physical Exam:  Cardiovascular: RRR Pulmonary: Clear to auscultation bilaterally Wounds: Clean and dry.     Lab Results: CBC:  Recent Labs  12/31/16 0432 01/01/17 0347  WBC 12.6* 10.6*  HGB 11.6* 11.1*  HCT 35.5* 36.1*  PLT 722* 665*   BMET:   Recent Labs  12/31/16 0432 01/01/17 0347  NA 135 133*  K 4.6 4.3  CL 98* 96*  CO2 26 25  GLUCOSE 100* 92  BUN 10 13  CREATININE 0.53* 0.56*  CALCIUM 9.4 9.7    PT/INR:  No results for input(s): LABPROT, INR in the last 72 hours. ABG:  INR: Will add last result for INR, ABG once components are confirmed Will add last 4 CBG results once components are confirmed  Assessment/Plan:  1. CV - SR in the 80's. 2.  Pulmonary - On room air. CXR  reviewed and appears stable (small, loculated left pleural effusion,ptx). 3. ID-On Daptomycin and Doxycycline for disseminated MRSA infection. He needs a total of 6 weeks of IV antibiotics. PICC line to be placed today. 4. DM-CBGs 158/97/95. On Insulin. Per primary 5. Will see PRN-follow up appointment has been arranged  Serafin Decatur MPA-C 01/01/2017,7:14 AM

## 2017-01-01 NOTE — Progress Notes (Signed)
[facility] PROGRESS NOTE  Keith Riggs GNF:[id number] DOB: 12/27/1990 DOA: 12/19/2016 PCP: System, Pcp Not In  Interim summary and HPI 26 y/o with IDDM, past history of heroin abuse, cocaine abuse who get random drug tests 2 x a month. [facility]. History obtained from Grandmother and father whom he lives with. He was seen there for a right groin abscess on Wed which was I and D'd. He was seen again on Friday for a packing of the abscess and was found to have a left groin abscess with was I and D'd. He returned to ER in the afternoon because he thought his packing had fallen out. At that time, he complained of back pain and was [facility] and [facility]. This showed thoracic epidural abscesses causing severe spinal cord compression, paraspinal myositis and abscesses, T6 osteo and b/l pleural effusions. Neurosurgery was consulted initially recommended conservative management. ID as well as cardiolothoracic surgery were consulted as well as interventional radiology for chest tube placement. Chest tubes have been removed on 12/29/2016, follow-up chest x-ray still shows some small loculated pleural effusion. ID recommended 6 weeks of IV antibiotics. Repeat cultures so far have been negative. Will place PICC. Needs to go to SNF.  Assessment/Plan: 1-sepsis due to disseminated MRSA: patient with bacteremia, epidural abscess (thoracic spine), T6 osteomyelitis, inguinal abscess bilaterally: -afebrile and with WBC's continue trending down -will continue IV Daptomycin and doxycycline; planning antibiotic therapy until 8/28 (to complete 6 weeks of IV antibiotics; after that planning for prolonged oral suppression using doxycycline orally) -will follow rec's any further recommendations from ID and CVTS -PICC line in place now -repeated blood cx's has remained neg, patient non toxic -given hx of IV drug use and need for PICC, will need SNF. 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; resolved with fluid restriction -most likely associated with acute infection and lung septic emboli -will monitor electrolytes intermittently -Na 133-135 currently  3-hypokalemia/hypomagnesemia -will monitor electrolytes and continue repletion as needed   4-type 2 diabetes -continue lantus and SSI -CBG's stable overall -advise to follow low carb diet   5-tobacco abuse -continue nicotine patch -cessation counsleing provided  6-IVDU: heroin and cocaine -patient reports being clean for 2 months now -UDS positive for opiates (but received narcotics in ED) -cessation counseling provided -given hx and high risk, PICC will be place and patient needs to go to control environment (SNF) for IV antibiotics.  7-obesity -Body mass index is 33.15 kg/m. -low calorie diet and exercise discussed  8-pain management  -will continue current pain medication regimen  -on percocet and morphine PRN. -continue also flexeril   Code Status: Full Family Communication: father at bedside  Disposition Plan: remains inpatient, continue IV antibiotics, follow CVTS and ID recommendations. Will need SNF. PICC in place and patient hemodynamically stable  Consultants:  IR  CVTS  ID  Neurosurgery   Nephrology (consulted for hyponatremia)  Procedures:  See below for x-ray reports   Paraspinal percutaneous drainage by IR  Chest tube by CVTS  Antibiotics:  Daptomycin and doxycycline   HPI/Subjective: Still with pain in his back, but stable/controlled with analgesics. No nausea no vomiting and good O2 sat on RA. No fever.  Objective: Vitals:   01/01/17 1215 01/01/17 1230  BP: (!) 155/71 (!) 148/82  Pulse: 79 87  Resp: (!) 21 19  Temp:      Intake/Output Summary (Last  24 hours) at 01/01/17 1356 Last data filed at 01/01/17 0816  Gross per 24 hour  Intake              580 ml  Output                 0 ml  Net              580 ml   Filed Weights   12/19/16 2324 12/28/16 0300 12/29/16 0457  Weight: 107.5 kg (237 lb) 109.9 kg (242 lb 3.2 oz) 107.8 kg (237 lb 11.2 oz)    Exam:   General: afebrile, no nausea, no vomiting, no SOB and with good O2 sat on RA> still with intermittent back/CP.  Cardiovascular: RRR, no rubs, no gallops, no JVD  Respiratory: good air movement, no wheezing, no crackles, normal resp effort  Abdomen: soft, NT, ND, positive BS  Musculoskeletal: no edema, no cyanosis and no clubbing.     Data Reviewed: Basic Metabolic Panel:  Recent Labs Lab 12/27/16 0234 12/28/16 0204 12/29/16 0456 12/31/16 0432 01/01/17 0347  NA 136 136 135 135 133*  K 4.2 4.4 4.1 4.6 4.3  CL 101 101 96* 98* 96*  CO2 25 27 30 26 25   GLUCOSE 223* 160* 91 100* 92  BUN 10 8 9 10 13   CREATININE 0.44* 0.45* 0.52* 0.53* 0.56*  CALCIUM 8.7* 9.0 9.3 9.4 9.7  MG  --   --  1.8 1.8  --    Liver Function Tests:  Recent Labs Lab 12/29/16 0456 12/31/16 0432  AST 23 19  ALT 53 34  ALKPHOS 101 89  BILITOT 0.5 0.2*  PROT 7.7 7.7  ALBUMIN 2.5* 2.8*   CBC:  Recent Labs Lab 12/29/16 0456 12/31/16 0432 01/01/17 0347  WBC 13.1* 12.6* 10.6*  NEUTROABS 9.1* 8.7*  --   HGB 11.9* 11.6* 11.1*  HCT 36.7* 35.5* 36.1*  MCV 84.0 84.1 84.0  PLT 789* 722* 665*   Cardiac Enzymes:  Recent Labs Lab 12/29/16 0456  CKTOTAL 24*   CBG:  Recent Labs Lab 12/31/16 1138 12/31/16 1630 12/31/16 2102 01/01/17 0626 01/01/17 1154  GLUCAP 127* 123* 115* 98 216*    Recent Results (from the past 240 hour(s))  Culture, blood (routine x 2)     Status: None   Collection Time: 12/24/16  6:34 AM  Result Value Ref Range Status   Specimen Description BLOOD LEFT HAND  Final   Special Requests IN PEDIATRIC BOTTLE Blood Culture adequate volume  Final   Culture NO GROWTH 5 DAYS  Final   Report Status 12/29/2016 FINAL  Final  Culture, blood (routine x 2)     Status: None   Collection Time:  12/24/16  6:34 AM  Result Value Ref Range Status   Specimen Description BLOOD RIGHT HAND  Final   Special Requests   Final    BOTTLES DRAWN AEROBIC ONLY Blood Culture adequate volume   Culture NO GROWTH 5 DAYS  Final   Report Status 12/29/2016 FINAL  Final  Aerobic/Anaerobic Culture (surgical/deep wound)     Status: None   Collection Time: 12/24/16  3:45 PM  Result Value Ref Range Status   Specimen Description ABSCESS LEFT PLEURAL  Final   Special Requests Normal  Final   Gram Stain   Final    FEW WBC PRESENT,BOTH PMN AND MONONUCLEAR NO ORGANISMS SEEN    Culture No growth aerobically or anaerobically.  Final   Report Status 12/29/2016 FINAL  Final  Studies: Dg Chest 2 View  Result Date: 01/01/2017 CLINICAL DATA:  Shortness of breath, pleural effusion. EXAM: CHEST  2 VIEW COMPARISON:  Radiographs of December 31, 2016. FINDINGS: The heart size and mediastinal contours are within normal limits. Stable right upper lobe airspace opacity is noted concerning for possible infiltrate. Stable loculated pleural effusion is seen along left chest wall. Stable air component is noted within this effusion. Left basilar atelectasis or infiltrate cannot be excluded. The visualized skeletal structures are unremarkable. IMPRESSION: Stable loculated left pleural effusion. Stable gas component of this effusion is noted laterally. Stable right upper lobe and left lower lobe opacities are noted. Electronically Signed   By: Lupita RaiderJames  Green Jr, M.D.   On: 01/01/2017 07:56   Dg Chest 2 View  Result Date: 12/31/2016 CLINICAL DATA:  Pleural effusion, shortness of Breath EXAM: CHEST  2 VIEW COMPARISON:  12/30/2016 FINDINGS: Small amount of pleural air again noted on the left. Small left pleural effusion, with loculated effusion projecting over the left lateral upper lobe. Nodular airspace disease throughout the left lung and in the right upper lobe. IMPRESSION: Stable loculated left effusion and small loculated left  pneumothorax. Stable nodular airspace disease bilaterally. Electronically Signed   By: Charlett NoseKevin  Dover M.D.   On: 12/31/2016 10:24    Scheduled Meds: . cyclobenzaprine  5 mg Oral TID  . doxycycline  100 mg Oral Q12H  . enoxaparin (LOVENOX) injection  40 mg Subcutaneous Q24H  . insulin aspart  0-15 Units Subcutaneous TID WC  . insulin aspart  0-5 Units Subcutaneous QHS  . insulin aspart  5 Units Subcutaneous TID WC  . insulin glargine  50 Units Subcutaneous Daily  . nicotine  21 mg Transdermal Daily  . polyethylene glycol  17 g Oral Daily  . senna-docusate  2 tablet Oral BID   Continuous Infusions: . 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 If 7PM-7AM, please contact night-coverage at [email or web address], password [email or web address] 01/01/2017, 1:56 PM  LOS: 12 days

## 2017-01-02 LAB — GLUCOSE, CAPILLARY
GLUCOSE-CAPILLARY: 72 mg/dL (ref 65–99)
GLUCOSE-CAPILLARY: 133 mg/dL — AB (ref 65–99)
Glucose-Capillary: 173 mg/dL — ABNORMAL HIGH (ref 65–99)
Glucose-Capillary: 98 mg/dL (ref 65–99)

## 2017-01-02 NOTE — Progress Notes (Signed)
PHARMACY CONSULT NOTE FOR: daptomycin  OUTPATIENT  PARENTERAL ANTIBIOTIC THERAPY (OPAT)  Indication: Disseminated MRSA infection  Regimen: Daptomycin 860mg  IV q24h End date: 02/03/17  IV antibiotic discharge orders are pended. To discharging provider:  please sign these orders via discharge navigator,  Select New Orders & click on the button choice - Manage This Unsigned Work.     Thank you for allowing pharmacy to be a part of this patient's care.  Benny LennertMeyer, Arius Harnois David 01/02/2017, 11:42 AM

## 2017-01-02 NOTE — Progress Notes (Signed)
    [facility]   Reason for visit: Follow up on MRSA disseminated infection  Interval History: no fever, no chills, WBC 10.6 yesterday.  No associated n/v/d.   Day 15 antibiotics daptomycin day 12 Doxycyline day 11  Physical Exam: Constitutional:  Vitals:   01/02/17 0004 01/02/17 0418  BP: 109/70 (!) 112/54  Pulse: 99 99  Resp: 17 14  Temp: 98.6 F (37 C) 98.6 F (37 C)   patient appears in NAD Respiratory: Normal respiratory effort; CTA B Cardiovascular: RRR GI: soft, nt, nd  Review of Systems: Constitutional: negative for fevers and chills Respiratory: negative for cough or sputum Gastrointestinal: negative for nausea  Lab Results  Component Value Date   WBC 10.6 (H) 01/01/2017   HGB 11.1 (L) 01/01/2017   HCT 36.1 (L) 01/01/2017   MCV 84.0 01/01/2017   PLT 665 (H) 01/01/2017    Lab Results  Component Value Date   CREATININE 0.56 (L) 01/01/2017   BUN 13 01/01/2017   NA 133 (L) 01/01/2017   K 4.3 01/01/2017   CL 96 (L) 01/01/2017   CO2 25 01/01/2017    Lab Results  Component Value Date   ALT 34 12/31/2016   AST 19 12/31/2016   ALKPHOS 89 12/31/2016     Microbiology: Recent Results (from the past 240 hour(s))  Culture, blood (routine x 2)     Status: None   Collection Time: 12/24/16  6:34 AM  Result Value Ref Range Status   Specimen Description BLOOD LEFT HAND  Final   Special Requests IN PEDIATRIC BOTTLE Blood Culture adequate volume  Final   Culture NO GROWTH 5 DAYS  Final   Report Status 12/29/2016 FINAL  Final  Culture, blood (routine x 2)     Status: None   Collection Time: 12/24/16  6:34 AM  Result Value Ref Range Status   Specimen Description BLOOD RIGHT HAND  Final   Special Requests   Final    BOTTLES DRAWN AEROBIC ONLY Blood Culture adequate volume   Culture NO GROWTH 5 DAYS  Final   Report Status 12/29/2016 FINAL  Final  Aerobic/Anaerobic Culture (surgical/deep wound)     Status: None   Collection Time:  12/24/16  3:45 PM  Result Value Ref Range Status   Specimen Description ABSCESS LEFT PLEURAL  Final   Special Requests Normal  Final   Gram Stain   Final    FEW WBC PRESENT,BOTH PMN AND MONONUCLEAR NO ORGANISMS SEEN    Culture No growth aerobically or anaerobically.  Final   Report Status 12/29/2016 FINAL  Final    Impression/Plan:  1. 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  Could not get therapeutic with vancomycin.   Will need at least 6 weeks IV therapy through August 28th. Ok from ID standpoint for discharge Likely will need prolonged oral therapy after the 6 weeks of daptomycin/doxycycline   2. Substance abuse - denies any recent substance abuse but significant history.  Going to a SNF.    I will arrange follow up in our clinic in about 4 weeks I will sign off

## 2017-01-02 NOTE — Progress Notes (Signed)
PROGRESS NOTE    Keith Riggs  UEA:[id number] DOB: 09/05/1990 DOA: 12/19/2016 PCP: System, Pcp Not In   Brief Narrative: [age] year old male insulin-dependent diabetes, history of polysubstance abuse including heroine, cocaine [facility]. In the ER patient had right groin abscess I and D's, complaining of back pain. Patient was [facility] to [facility] for [facility]. 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 Patient was evaluated by neurosurgery who recommended conservative management. Infectious disease as well as cardiothoracic surgeon were consulted. The chest tube was placed. The chest tubes was removed on July 23, follow-up chest x-ray still shows some small loculated pleural effusion. Infectious disease recommended 6 weeks of IV antibiotics. Patient has PICC line placed. Now awaiting skilled nursing facility for prolonged IV antibiotics.  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 -Social worker evaluation on going. -Patient now has PICC line placed -Given history of IB drug abuse, patient is waiting to go to a skilled facility for the antibiotic administration. -Social worker and care management team is following for discharge planning.   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  #Hypokalemia/hypomagnesemia continue to monitor the lites. Increase oral intakes  #Insulin-dependent diabetes: Continue current insulin regimen. Monitor blood sugar  level.  #Pain management: Patient is currently on IV morphine, oxycodone as needed and bowel regimen. Continue muscle relaxant with Flexeril.  #IV drug abuse: Education provided to the patient.  #Obesity: Low calorie diet recommended  DVT prophylaxis: Lovenox subcutaneous Code Status: Full code Family Communication: No family at bedside Disposition Plan: Likely discharge to skilled facility when bed is available    Consultants:   Infectious disease  Neurosurgery  Nephrology  Cardiothoracic surgery  Intravascular radiology  Procedures:  See below for x-ray reports   Paraspinal percutaneous drainage by IR  Chest tube by CVTS Antimicrobials: Daptomycin and doxycycline Subjective: Patient is still complaining of back pain. Before my arrival to bedside, patient was sleeping comfortably. Denied nausea vomiting or dizziness. No chest pain  Objective: Vitals:   01/01/17 2000 01/02/17 0004 01/02/17 0418 01/02/17 1148  BP: 108/73 109/70 (!) 112/54 104/64  Pulse: (!) 111 99 99   Resp: 19 17 14    Temp: 98.7 F (37.1 C) 98.6 F (37 C) 98.6 F (37 C) (!) 96.6 F (35.9 C)  TempSrc: Oral Oral Oral Axillary  SpO2: 100% 95% 92%   Weight:      Height:        Intake/Output Summary (Last 24 hours) at 01/02/17 1407 Last data filed at 01/02/17 0325  Gross per 24 hour  Intake           1104.4 ml  Output                0 ml  Net           1104.4 ml   Filed Weights   12/19/16 2324 12/28/16 0300 12/29/16 0457  Weight: 107.5 kg (237 lb) 109.9 kg (242 lb 3.2 oz) 107.8 kg (237 lb 11.2 oz)    Examination:  General exam: Appears calm and comfortable  Respiratory system: Clear to auscultation. Respiratory effort normal. No wheezing or crackle Cardiovascular system: S1 & S2 heard, RRR.  No pedal edema. Gastrointestinal system: Abdomen is nondistended, soft and nontender. Normal bowel sounds heard. Central nervous system: Alert and oriented. No focal neurological  deficits. Extremities: Symmetric 5 x 5 power. Skin: No rashes, lesions or ulcers Psychiatry: Judgement and insight appear normal. Mood & affect appropriate.     Data Reviewed: I have personally reviewed following labs and imaging studies  CBC:  Recent Labs Lab 12/29/16 0456 12/31/16 0432 01/01/17 0347  WBC 13.1* 12.6* 10.6*  NEUTROABS 9.1* 8.7*  --   HGB 11.9* 11.6* 11.1*  HCT 36.7* 35.5* 36.1*  MCV 84.0 84.1 84.0  PLT 789* 722* 665*   Basic Metabolic Panel:  Recent Labs Lab 12/27/16 0234 12/28/16 0204 12/29/16 0456 12/31/16 0432 01/01/17 0347  NA 136 136 135 135 133*  K 4.2 4.4 4.1 4.6 4.3  CL 101 101 96* 98* 96*  CO2 25 27 30 26 25   GLUCOSE 223* 160* 91 100* 92  BUN 10 8 9 10 13   CREATININE 0.44* 0.45* 0.52* 0.53* 0.56*  CALCIUM 8.7* 9.0 9.3 9.4 9.7  MG  --   --  1.8 1.8  --    GFR: Estimated Creatinine Clearance: 171.7 mL/min (A) (by C-G formula based on SCr of 0.56 mg/dL (L)). Liver Function Tests:  Recent Labs Lab 12/29/16 0456 12/31/16 0432  AST 23 19  ALT 53 34  ALKPHOS 101 89  BILITOT 0.5 0.2*  PROT 7.7 7.7  ALBUMIN 2.5* 2.8*   No results for input(s): LIPASE, AMYLASE in the last 168 hours. No results for input(s): AMMONIA in the last 168 hours. Coagulation Profile:  Recent Labs Lab 12/29/16 0456  INR 1.03   Cardiac Enzymes:  Recent Labs Lab 12/29/16 0456  CKTOTAL 24*   BNP (last 3 results) No results for input(s): PROBNP in the last 8760 hours. HbA1C: No results for input(s): HGBA1C in the last 72 hours. CBG:  Recent Labs Lab 01/01/17 1154 01/01/17 1635 01/01/17 2104 01/02/17 0619 01/02/17 1145  GLUCAP 216* 78 118* 72 133*   Lipid Profile: No results for input(s): CHOL, HDL, LDLCALC, TRIG, CHOLHDL, LDLDIRECT in the last 72 hours. Thyroid Function Tests: No results for input(s): TSH, T4TOTAL, FREET4, T3FREE, THYROIDAB in the last 72 hours. Anemia Panel: No results for input(s): VITAMINB12, FOLATE, FERRITIN, TIBC,  IRON, RETICCTPCT in the last 72 hours. Sepsis Labs: No results for input(s): PROCALCITON, LATICACIDVEN in the last 168 hours.  Recent Results (from the past 240 hour(s))  Culture, blood (routine x 2)     Status: None   Collection Time: 12/24/16  6:34 AM  Result Value Ref Range Status   Specimen Description BLOOD LEFT HAND  Final   Special Requests IN PEDIATRIC BOTTLE Blood Culture adequate volume  Final   Culture NO GROWTH 5 DAYS  Final   Report Status 12/29/2016 FINAL  Final  Culture, blood (routine x 2)     Status: None   Collection Time: 12/24/16  6:34 AM  Result Value Ref Range Status   Specimen Description BLOOD RIGHT HAND  Final   Special Requests   Final    BOTTLES DRAWN AEROBIC ONLY Blood Culture adequate volume   Culture NO GROWTH 5 DAYS  Final   Report Status 12/29/2016 FINAL  Final  Aerobic/Anaerobic Culture (surgical/deep wound)     Status: None   Collection Time: 12/24/16  3:45 PM  Result Value Ref Range Status  Specimen Description ABSCESS LEFT PLEURAL  Final   Special Requests Normal  Final   Gram Stain   Final    FEW WBC PRESENT,BOTH PMN AND MONONUCLEAR NO ORGANISMS SEEN    Culture No growth aerobically or anaerobically.  Final   Report Status 12/29/2016 FINAL  Final         Radiology Studies: Dg Chest 2 View  Result Date: 01/01/2017 CLINICAL DATA:  Shortness of breath, pleural effusion. EXAM: CHEST  2 VIEW COMPARISON:  Radiographs of December 31, 2016. FINDINGS: The heart size and mediastinal contours are within normal limits. Stable right upper lobe airspace opacity is noted concerning for possible infiltrate. Stable loculated pleural effusion is seen along left chest wall. Stable air component is noted within this effusion. Left basilar atelectasis or infiltrate cannot be excluded. The visualized skeletal structures are unremarkable. IMPRESSION: Stable loculated left pleural effusion. Stable gas component of this effusion is noted laterally. Stable right upper  lobe and left lower lobe opacities are noted. Electronically Signed   By: Lupita Raider, M.D.   On: 01/01/2017 07:56        Scheduled Meds: . cyclobenzaprine  5 mg Oral TID  . doxycycline  100 mg Oral Q12H  . enoxaparin (LOVENOX) injection  40 mg Subcutaneous Q24H  . insulin aspart  0-15 Units Subcutaneous TID WC  . insulin aspart  0-5 Units Subcutaneous QHS  . insulin aspart  5 Units Subcutaneous TID WC  . insulin glargine  50 Units Subcutaneous Daily  . nicotine  21 mg Transdermal Daily  . polyethylene glycol  17 g Oral Daily  . senna-docusate  2 tablet Oral BID   Continuous Infusions: . DAPTOmycin (CUBICIN)  IV 860 mg (01/01/17 1800)     LOS: 13 days    Marene Gilliam Jaynie Collins, MD [facility] Pager [phone]  If 7PM-7AM, please contact night-coverage [email or web address] Password [email or web address] 01/02/2017, 2:07 PM

## 2017-01-03 DIAGNOSIS — R52 Pain, unspecified: Secondary | ICD-10-CM

## 2017-01-03 LAB — GLUCOSE, CAPILLARY
GLUCOSE-CAPILLARY: 93 mg/dL (ref 65–99)
GLUCOSE-CAPILLARY: 151 mg/dL — AB (ref 65–99)
GLUCOSE-CAPILLARY: 200 mg/dL — AB (ref 65–99)
GLUCOSE-CAPILLARY: 142 mg/dL — AB (ref 65–99)

## 2017-01-03 NOTE — Progress Notes (Signed)
Clinical Social Worker is following patient for disposition needs. At this time CSW is searching for a SNF placement for patient long term antibiotic needs.  Marrianne MoodAshley Nami Strawder, MSW,  [employer] [phone]

## 2017-01-03 NOTE — Progress Notes (Signed)
PROGRESS NOTE    Keith Riggs  ZOX:[id number] DOB: 02-20-1991 DOA: 12/19/2016 PCP: System, Pcp Not In   Brief Narrative: 26 year old male insulin-dependent diabetes, history of polysubstance abuse including heroine, cocaine transferred from [facility] ER. In the ER patient had right groin abscess I and D's, complaining of back pain. Patient was sent to [facility] for MRI. 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 Patient was evaluated by neurosurgery who recommended conservative management. Infectious disease as well as cardiothoracic surgeon were consulted. The chest tube was placed. The chest tubes was removed on July 23, follow-up chest x-ray still shows some small loculated pleural effusion. Infectious disease recommended 6 weeks of IV antibiotics. Patient has PICC line placed. Now awaiting skilled nursing facility for prolonged IV antibiotics.  Continue current pain management. Has PICC line and getting IV antibiotics. Social worker evaluation ongoing for safe discharge plan to rehabilitation for prolonged antibiotics.  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 -Social worker evaluation on going. -Patient now has PICC line placed -Given history of IV drug abuse, patient is waiting to go to a skilled facility for the antibiotic administration. -Social worker and care management team is following for discharge planning.   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  #Hypokalemia/hypomagnesemia continue to monitor the lites. Increase oral intakes  #Insulin-dependent diabetes: Continue current insulin regimen. Monitor blood sugar level.  #Pain management: Patient is currently on IV morphine, oxycodone as needed and bowel regimen. Continue muscle relaxant with Flexeril. Education provided to the patient to try to wean narcotics.  #IV drug abuse: Education provided to the patient.  #Obesity: Low calorie diet recommended  DVT prophylaxis: Lovenox subcutaneous Code Status: Full code Family Communication: No family at bedside Disposition Plan: Likely discharge to skilled facility when bed is available    Consultants:   Infectious disease  Neurosurgery  Nephrology  Cardiothoracic surgery  Intravascular radiology  Procedures:  See below for x-ray reports   Paraspinal percutaneous drainage by IR  Chest tube by CVTS Antimicrobials: Daptomycin and doxycycline Subjective: Patient is still complaining of back pain. No new event. No chest pain, shortness of breath, nausea vomiting or headache. Objective: Vitals:   01/02/17 1925 01/02/17 2347 01/03/17 0500 01/03/17 0847  BP: 105/65 105/66    Pulse: (!) 109 (!) 106 92 (!) 107  Resp: 18 17 15    Temp: 98.6 F (37 C) 98.2 F (36.8 C)  97.8 F (36.6 C)  TempSrc: Oral Oral  Oral  SpO2: 98% 98% 95%   Weight:      Height:        Intake/Output Summary (Last 24 hours) at 01/03/17 1042 Last data filed at 01/03/17 0847  Gross per 24 hour  Intake              120 ml  Output              900 ml  Net             -780 ml   Filed Weights   12/19/16 2324 12/28/16 0300 12/29/16 0457  Weight: 107.5 kg (237 lb) 109.9 kg (242 lb 3.2 oz) 107.8 kg (237 lb 11.2 oz)    Examination:  General exam: Not in distress Respiratory system: Clear bilateral, respiratory effort normal Cardiovascular system: Regular rate rhythm S1-S2 normal. No pedal edema. Gastrointestinal system: Abdomen is  nondistended, soft and nontender. Normal bowel sounds heard. Central nervous system: Alert and oriented. No focal neurological deficits. Extremities: Symmetric 5 x 5 power. Skin: No rashes, lesions or ulcers Psychiatry: Judgement and insight appear normal. Mood & affect appropriate.     Data Reviewed: I have personally reviewed following labs and imaging studies  CBC:  Recent Labs Lab 12/29/16 0456 12/31/16 0432 01/01/17 0347  WBC 13.1* 12.6* 10.6*  NEUTROABS 9.1* 8.7*  --   HGB 11.9* 11.6* 11.1*  HCT 36.7* 35.5* 36.1*  MCV 84.0 84.1 84.0  PLT 789* 722* 665*   Basic Metabolic Panel:  Recent Labs Lab 12/28/16 0204 12/29/16 0456 12/31/16 0432 01/01/17 0347  NA 136 135 135 133*  K 4.4 4.1 4.6 4.3  CL 101 96* 98* 96*  CO2 27 30 26 25   GLUCOSE 160* 91 100* 92  BUN 8 9 10 13   CREATININE 0.45* 0.52* 0.53* 0.56*  CALCIUM 9.0 9.3 9.4 9.7  MG  --  1.8 1.8  --    GFR: Estimated Creatinine Clearance: 171.7 mL/min (A) (by C-G formula based on SCr of 0.56 mg/dL (L)). Liver Function Tests:  Recent Labs Lab 12/29/16 0456 12/31/16 0432  AST 23 19  ALT 53 34  ALKPHOS 101 89  BILITOT 0.5 0.2*  PROT 7.7 7.7  ALBUMIN 2.5* 2.8*   No results for input(s): LIPASE, AMYLASE in the last 168 hours. No results for input(s): AMMONIA in the last 168 hours. Coagulation Profile:  Recent Labs Lab 12/29/16 0456  INR 1.03   Cardiac Enzymes:  Recent Labs Lab 12/29/16 0456  CKTOTAL 24*   BNP (last 3 results) No results for input(s): PROBNP in the last 8760 hours. HbA1C: No results for input(s): HGBA1C in the last 72 hours. CBG:  Recent Labs Lab 01/02/17 0619 01/02/17 1145 01/02/17 1618 01/02/17 2056 01/03/17 0609  GLUCAP 72 133* 173* 98 93   Lipid Profile: No results for input(s): CHOL, HDL, LDLCALC, TRIG, CHOLHDL, LDLDIRECT in the last 72 hours. Thyroid Function Tests: No results for input(s): TSH, T4TOTAL, FREET4, T3FREE, THYROIDAB in the last 72 hours. Anemia  Panel: No results for input(s): VITAMINB12, FOLATE, FERRITIN, TIBC, IRON, RETICCTPCT in the last 72 hours. Sepsis Labs: No results for input(s): PROCALCITON, LATICACIDVEN in the last 168 hours.  Recent Results (from the past 240 hour(s))  Aerobic/Anaerobic Culture (surgical/deep wound)     Status: None   Collection Time: 12/24/16  3:45 PM  Result Value Ref Range Status   Specimen Description ABSCESS LEFT PLEURAL  Final   Special Requests Normal  Final   Gram Stain   Final    FEW WBC PRESENT,BOTH PMN AND MONONUCLEAR NO ORGANISMS SEEN    Culture No growth aerobically or anaerobically.  Final   Report Status 12/29/2016 FINAL  Final         Radiology Studies: No results found.      Scheduled Meds: . cyclobenzaprine  5 mg Oral TID  . doxycycline  100 mg Oral Q12H  . enoxaparin (LOVENOX) injection  40 mg Subcutaneous Q24H  . insulin aspart  0-15 Units Subcutaneous TID WC  . insulin aspart  0-5 Units Subcutaneous QHS  . insulin aspart  5 Units Subcutaneous TID  WC  . insulin glargine  50 Units Subcutaneous Daily  . nicotine  21 mg Transdermal Daily  . polyethylene glycol  17 g Oral Daily  . senna-docusate  2 tablet Oral BID   Continuous Infusions: . DAPTOmycin (CUBICIN)  IV Stopped (01/02/17 1814)     LOS: 14 days    Charles Andringa Jaynie CollinsPrasad Sheria Rosello, MD [facility] Pager [phone]  If 7PM-7AM, please contact night-coverage [email or web address] Password [email or web address] 01/03/2017, 10:42 AM

## 2017-01-04 LAB — GLUCOSE, CAPILLARY
GLUCOSE-CAPILLARY: 82 mg/dL (ref 65–99)
GLUCOSE-CAPILLARY: 126 mg/dL — AB (ref 65–99)
GLUCOSE-CAPILLARY: 338 mg/dL — AB (ref 65–99)
GLUCOSE-CAPILLARY: 105 mg/dL — AB (ref 65–99)

## 2017-01-04 NOTE — Progress Notes (Signed)
Patient has been ambulating hallway with no problem. Continues to complain of mid back pain, continuing IV daptomycin and oral doxycyline. Blood sugar showing 338 during suppertime. Patient eats a lot of saltine crackers during shift; monitoring fluid intake. Shift uneventful.

## 2017-01-04 NOTE — Progress Notes (Signed)
PROGRESS NOTE    Keith Riggs  ZOX:[id number] DOB: 05/29/1991 DOA: 12/19/2016 PCP: System, Pcp Not In   Brief Narrative: 26 year old male insulin-dependent diabetes, history of polysubstance abuse including heroine, cocaine [facility]. In the ER patient had right groin abscess I and D's, complaining of back pain. Patient [facility]. 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 Patient was evaluated by neurosurgery who recommended conservative management. Infectious disease as well as cardiothoracic surgeon were consulted. The chest tube was placed. The chest tubes was removed on July 23, follow-up chest x-ray still shows some small loculated pleural effusion. Infectious disease recommended 6 weeks of IV antibiotics. Patient has PICC line placed. Now awaiting skilled nursing facility for prolonged IV antibiotics.  Patient is clinically stable. Continue current medication for pain management. Continue IV daptomycin and oral doxycycline as per infectious disease. Patient is now waiting for skilled nursing facility for the management of long-term antibiotics because of history of IV drug use. No change in medical management today.  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 -Social worker evaluation on going. -Patient now has PICC line placed -Given history of IV drug abuse, patient is waiting to go to a skilled facility for the antibiotic administration. -Social worker and care  management team is following for discharge planning.   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  #Hypokalemia/hypomagnesemia continue to monitor the lites. He has good oral intake.  #Insulin-dependent diabetes: Continue current insulin regimen. Monitor blood sugar level.  #Pain management: Patient is currently on IV morphine, oxycodone as needed and bowel regimen. Continue muscle relaxant with Flexeril. Education provided to the patient to try to wean narcotics.  #IV drug abuse: Education provided to the patient.  #Obesity: Low calorie diet recommended  DVT prophylaxis: Lovenox subcutaneous Code Status: Full code Family Communication: No family at bedside Disposition Plan: Likely discharge to skilled facility when bed is available    Consultants:   Infectious disease  Neurosurgery  Nephrology  Cardiothoracic surgery  Intravascular radiology  Procedures:  See below for x-ray reports   Paraspinal percutaneous drainage by IR  Chest tube by CVTS Antimicrobials: Daptomycin and doxycycline Subjective: Patient is still combining of back pain and pressure with pain medication. Denied chest pain or shortness of breath nausea vomiting. Objective: Vitals:   01/03/17 1405 01/03/17 1958 01/04/17 0425 01/04/17 0816  BP: (!) 121/59 123/82 114/67   Pulse: (!) 112 (!) 108 83 (!) 102  Resp: 18 18 16    Temp: 97.7 F (36.5 C) 99.6 F (37.6 C) 98.1 F (36.7 C) 98.2 F (36.8 C)  TempSrc: Oral Oral Oral Oral  SpO2: 99% 99% 98%   Weight:   106.8 kg (235 lb 6.4 oz)   Height:        Intake/Output Summary (Last 24 hours) at 01/04/17 1324 Last data filed at 01/04/17 1040  Gross per 24 hour  Intake              250 ml  Output  600 ml  Net             -350 ml   Filed Weights   12/28/16 0300 12/29/16 0457 01/04/17 0425  Weight: 109.9 kg (242 lb 3.2 oz) 107.8 kg (237 lb 11.2 oz) 106.8 kg (235 lb 6.4 oz)     Examination:  General exam: Sitting on bed, not in distress Respiratory system: Clear bilaterally, respiratory effort normal Cardiovascular system: Regular rate and rhythm, S1 is normal. No pedal edema. Gastrointestinal system: Abdomen is nondistended, soft and nontender. Normal bowel sounds heard. Central nervous system: Alert and oriented. No focal neurological deficits. Extremities: Symmetric 5 x 5 power. Skin: No rashes, lesions or ulcers Psychiatry: Judgement and insight appear normal. Mood & affect appropriate.     Data Reviewed: I have personally reviewed following labs and imaging studies  CBC:  Recent Labs Lab 12/29/16 0456 12/31/16 0432 01/01/17 0347  WBC 13.1* 12.6* 10.6*  NEUTROABS 9.1* 8.7*  --   HGB 11.9* 11.6* 11.1*  HCT 36.7* 35.5* 36.1*  MCV 84.0 84.1 84.0  PLT 789* 722* 665*   Basic Metabolic Panel:  Recent Labs Lab 12/29/16 0456 12/31/16 0432 01/01/17 0347  NA 135 135 133*  K 4.1 4.6 4.3  CL 96* 98* 96*  CO2 30 26 25   GLUCOSE 91 100* 92  BUN 9 10 13   CREATININE 0.52* 0.53* 0.56*  CALCIUM 9.3 9.4 9.7  MG 1.8 1.8  --    GFR: Estimated Creatinine Clearance: 170.9 mL/min (A) (by C-G formula based on SCr of 0.56 mg/dL (L)). Liver Function Tests:  Recent Labs Lab 12/29/16 0456 12/31/16 0432  AST 23 19  ALT 53 34  ALKPHOS 101 89  BILITOT 0.5 0.2*  PROT 7.7 7.7  ALBUMIN 2.5* 2.8*   No results for input(s): LIPASE, AMYLASE in the last 168 hours. No results for input(s): AMMONIA in the last 168 hours. Coagulation Profile:  Recent Labs Lab 12/29/16 0456  INR 1.03   Cardiac Enzymes:  Recent Labs Lab 12/29/16 0456  CKTOTAL 24*   BNP (last 3 results) No results for input(s): PROBNP in the last 8760 hours. HbA1C: No results for input(s): HGBA1C in the last 72 hours. CBG:  Recent Labs Lab 01/03/17 1223 01/03/17 1705 01/03/17 2107 01/04/17 0647 01/04/17 1221  GLUCAP 151* 200* 142* 82 126*   Lipid Profile: No results  for input(s): CHOL, HDL, LDLCALC, TRIG, CHOLHDL, LDLDIRECT in the last 72 hours. Thyroid Function Tests: No results for input(s): TSH, T4TOTAL, FREET4, T3FREE, THYROIDAB in the last 72 hours. Anemia Panel: No results for input(s): VITAMINB12, FOLATE, FERRITIN, TIBC, IRON, RETICCTPCT in the last 72 hours. Sepsis Labs: No results for input(s): PROCALCITON, LATICACIDVEN in the last 168 hours.  No results found for this or any previous visit (from the past 240 hour(s)).       Radiology Studies: No results found.      Scheduled Meds: . cyclobenzaprine  5 mg Oral TID  . doxycycline  100 mg Oral Q12H  . enoxaparin (LOVENOX) injection  40 mg Subcutaneous Q24H  . insulin aspart  0-15 Units Subcutaneous TID WC  . insulin aspart  0-5 Units Subcutaneous QHS  . insulin aspart  5 Units Subcutaneous TID WC  . insulin glargine  50 Units Subcutaneous Daily  . nicotine  21 mg Transdermal Daily  . polyethylene glycol  17 g Oral Daily  . senna-docusate  2 tablet Oral BID   Continuous Infusions: . DAPTOmycin (CUBICIN)  IV Stopped (01/03/17 1833)  LOS: 15 days    Reyli Schroth Jaynie CollinsPrasad Cote Mayabb, MD [facility] Pager [phone]  If 7PM-7AM, please contact night-coverage [email or web address] Password [email or web address] 01/04/2017, 1:24 PM

## 2017-01-05 LAB — GLUCOSE, CAPILLARY
GLUCOSE-CAPILLARY: 78 mg/dL (ref 65–99)
GLUCOSE-CAPILLARY: 118 mg/dL — AB (ref 65–99)
GLUCOSE-CAPILLARY: 93 mg/dL (ref 65–99)
Glucose-Capillary: 206 mg/dL — ABNORMAL HIGH (ref 65–99)

## 2017-01-05 LAB — CK: CK TOTAL: 31 U/L — AB (ref 49–397)

## 2017-01-05 NOTE — Progress Notes (Signed)
MD notified that patient no longer meets "Stepdown" criteria.

## 2017-01-05 NOTE — Progress Notes (Signed)
PROGRESS NOTE    Keith Riggs  ZOX:[id number] DOB: 07/20/1990 DOA: 12/19/2016 PCP: System, Pcp Not In   Brief Narrative: 26 year old male insulin-dependent diabetes, history of polysubstance abuse including heroine, cocaine [facility]. In the ER patient had right groin abscess I and D's, complaining of back pain. Patient was sent [facility]. 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 Patient was evaluated by neurosurgery who recommended conservative management. Infectious disease as well as cardiothoracic surgeon were consulted. The chest tube was placed. The chest tubes was removed on July 23, follow-up chest x-ray still shows some small loculated pleural effusion. Infectious disease recommended 6 weeks of IV antibiotics. Patient has PICC line placed. Now awaiting skilled nursing facility for prolonged IV antibiotics.  Patient is clinically stable. Continue current medication for pain management. Continue IV daptomycin and oral doxycycline as per infectious disease. Patient is now waiting for skilled nursing facility for the management of long-term antibiotics because of history of IV drug use. No change in medical management today.  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 -Social worker evaluation on going. -Patient now has PICC line placed -Given history of IV drug abuse, patient is waiting to go to a skilled facility for the antibiotic administration. -Social worker and care  management team is following for discharge planning.   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  #Hypokalemia/hypomagnesemia continue to monitor the lites. He has good oral intake.  #Insulin-dependent diabetes: a1c11.9,  Continue current insulin regimen. Monitor blood sugar level.  #Pain management: Patient is currently on IV morphine, oxycodone as needed and bowel regimen. Continue muscle relaxant with Flexeril. Education provided to the patient to try to wean narcotics.  #IV drug abuse: Education provided to the patient.  #Obesity: Low calorie diet recommended Body mass index is 32.73 kg/m.   DVT prophylaxis: Lovenox subcutaneous Code Status: Full code Family Communication: No family at bedside Disposition Plan: Likely discharge to skilled facility when bed is available    Consultants:   Infectious disease  Neurosurgery  Nephrology  Cardiothoracic surgery  Intravascular radiology  Procedures:  See below for x-ray reports   Paraspinal percutaneous drainage by IR  Chest tube by CVTS  picc line placement  Antimicrobials: Daptomycin and doxycycline  Subjective: Patient is still combining of back pain and pressure with pain medication. Denied chest pain or shortness of breath nausea vomiting. He has sinus tachycardia Objective: Vitals:   01/05/17 0424 01/05/17 0823 01/05/17 1226 01/05/17 1635  BP: 111/65 (!) 115/50  134/77  Pulse: 93 (!) 104  (!) 114  Resp: 16 18  (!) 35  Temp: 98.1 F (36.7 C) 97.6 F (36.4 C) 98.6 F (37 C) 99 F (37.2 C)  TempSrc: Oral Oral Oral Oral  SpO2: 95% 95%  97%  Weight: 106.5 kg (234 lb 11.2 oz)     Height:        Intake/Output Summary (Last 24 hours) at 01/05/17 1719 Last data filed at 01/05/17 1227  Gross per 24 hour  Intake  720 ml  Output             1700 ml  Net             -980 ml   Filed Weights   12/29/16 0457 01/04/17 0425 01/05/17 0424    Weight: 107.8 kg (237 lb 11.2 oz) 106.8 kg (235 lb 6.4 oz) 106.5 kg (234 lb 11.2 oz)    Examination:  General exam: Sitting on bed, not in distress Respiratory system: Clear bilaterally, respiratory effort normal Cardiovascular system: Regular rate and rhythm, S1 is normal. No pedal edema. Gastrointestinal system: Abdomen is nondistended, soft and nontender. Normal bowel sounds heard. Central nervous system: Alert and oriented. No focal neurological deficits. Extremities: Symmetric 5 x 5 power. Skin: No rashes, lesions or ulcers Psychiatry: Judgement and insight appear normal. Mood & affect appropriate.     Data Reviewed: I have personally reviewed following labs and imaging studies  CBC:  Recent Labs Lab 12/31/16 0432 01/01/17 0347  WBC 12.6* 10.6*  NEUTROABS 8.7*  --   HGB 11.6* 11.1*  HCT 35.5* 36.1*  MCV 84.1 84.0  PLT 722* 665*   Basic Metabolic Panel:  Recent Labs Lab 12/31/16 0432 01/01/17 0347  NA 135 133*  K 4.6 4.3  CL 98* 96*  CO2 26 25  GLUCOSE 100* 92  BUN 10 13  CREATININE 0.53* 0.56*  CALCIUM 9.4 9.7  MG 1.8  --    GFR: Estimated Creatinine Clearance: 170.7 mL/min (A) (by C-G formula based on SCr of 0.56 mg/dL (L)). Liver Function Tests:  Recent Labs Lab 12/31/16 0432  AST 19  ALT 34  ALKPHOS 89  BILITOT 0.2*  PROT 7.7  ALBUMIN 2.8*   No results for input(s): LIPASE, AMYLASE in the last 168 hours. No results for input(s): AMMONIA in the last 168 hours. Coagulation Profile: No results for input(s): INR, PROTIME in the last 168 hours. Cardiac Enzymes:  Recent Labs Lab 01/05/17 0432  CKTOTAL 31*   BNP (last 3 results) No results for input(s): PROBNP in the last 8760 hours. HbA1C: No results for input(s): HGBA1C in the last 72 hours. CBG:  Recent Labs Lab 01/04/17 1650 01/04/17 2221 01/05/17 0611 01/05/17 1131 01/05/17 1650  GLUCAP 338* 105* 78 118* 206*   Lipid Profile: No results for input(s): CHOL, HDL, LDLCALC,  TRIG, CHOLHDL, LDLDIRECT in the last 72 hours. Thyroid Function Tests: No results for input(s): TSH, T4TOTAL, FREET4, T3FREE, THYROIDAB in the last 72 hours. Anemia Panel: No results for input(s): VITAMINB12, FOLATE, FERRITIN, TIBC, IRON, RETICCTPCT in the last 72 hours. Sepsis Labs: No results for input(s): PROCALCITON, LATICACIDVEN in the last 168 hours.  No results found for this or any previous visit (from the past 240 hour(s)).       Radiology Studies: No results found.      Scheduled Meds: . cyclobenzaprine  5 mg Oral TID  . doxycycline  100 mg Oral Q12H  . enoxaparin (LOVENOX) injection  40 mg Subcutaneous Q24H  . insulin aspart  0-15 Units Subcutaneous TID WC  . insulin aspart  0-5 Units Subcutaneous QHS  . insulin aspart  5 Units Subcutaneous TID WC  . insulin glargine  50 Units Subcutaneous Daily  . nicotine  21 mg Transdermal Daily  . polyethylene glycol  17 g Oral Daily  . senna-docusate  2 tablet Oral BID   Continuous Infusions: . DAPTOmycin (CUBICIN)  IV 860 mg (01/05/17 1712)     LOS: 16 days    Jaisa Defino,  MD PhD [facility] Pager [phone]  If 7PM-7AM, please contact night-coverage [email or web address] Password [email or web address] 01/05/2017, 5:19 PM

## 2017-01-06 ENCOUNTER — Inpatient Hospital Stay
Admission: RE | Admit: 2017-01-06 | Discharge: 2017-01-28 | Disposition: A | Discharge status: Nursing Home (Includes ICF) | Payer: Medicaid - Out of State | Source: Ambulatory Visit | Attending: Internal Medicine | Admitting: Internal Medicine

## 2017-01-06 LAB — TSH: TSH: 3.941 u[IU]/mL (ref 0.350–4.500)

## 2017-01-06 LAB — BASIC METABOLIC PANEL
ANION GAP: 8 (ref 5–15)
BUN: 10 mg/dL (ref 6–20)
CO2: 28 mmol/L (ref 22–32)
Calcium: 9 mg/dL (ref 8.9–10.3)
Chloride: 99 mmol/L — ABNORMAL LOW (ref 101–111)
Creatinine, Ser: 0.48 mg/dL — ABNORMAL LOW (ref 0.61–1.24)
GFR calc Af Amer: >60 mL/min (ref >60)
Glucose, Bld: 143 mg/dL — ABNORMAL HIGH (ref 65–99)
POTASSIUM: 3.7 mmol/L (ref 3.5–5.1)
SODIUM: 135 mmol/L (ref 135–145)

## 2017-01-06 LAB — CBC
HCT: 32.3 % — ABNORMAL LOW (ref 39.0–52.0)
Hemoglobin: 10.3 g/dL — ABNORMAL LOW (ref 13.0–17.0)
MCH: 26.2 pg (ref 26.0–34.0)
MCHC: 31.9 g/dL (ref 30.0–36.0)
MCV: 82.2 fL (ref 78.0–100.0)
PLATELETS: 474 10*3/uL — AB (ref 150–400)
RBC: 3.93 MIL/uL — AB (ref 4.22–5.81)
RDW: 14 % (ref 11.5–15.5)
WBC: 8.5 10*3/uL (ref 4.0–10.5)

## 2017-01-06 LAB — MAGNESIUM: MAGNESIUM: 1.7 mg/dL (ref 1.7–2.4)

## 2017-01-06 LAB — GLUCOSE, CAPILLARY
GLUCOSE-CAPILLARY: 147 mg/dL — AB (ref 65–99)
GLUCOSE-CAPILLARY: 120 mg/dL — AB (ref 65–99)
Glucose-Capillary: 109 mg/dL — ABNORMAL HIGH (ref 65–99)

## 2017-01-06 MED ORDER — INSULIN GLARGINE 300 UNIT/ML ~~LOC~~ SOPN: 50.0000 [IU] | PEN_INJECTOR | Freq: Every day | SUBCUTANEOUS | 0 refills | Status: DC

## 2017-01-06 MED ORDER — HYDROCODONE-ACETAMINOPHEN 5-325 MG PO TABS: 1.0000 | ORAL_TABLET | ORAL | 0 refills | Status: DC | PRN

## 2017-01-06 MED ORDER — POLYETHYLENE GLYCOL 3350 17 G PO PACK
17.0000 g | PACK | Freq: Every day | ORAL | 0 refills | Status: DC
Start: 2017-01-07 — End: 2020-10-14

## 2017-01-06 MED ORDER — DAPTOMYCIN IV (FOR PTA / DISCHARGE USE ONLY): 860.0000 mg | INTRAVENOUS | 0 refills | Status: DC

## 2017-01-06 MED ORDER — SENNOSIDES-DOCUSATE SODIUM 8.6-50 MG PO TABS
2.0000 | ORAL_TABLET | Freq: Every day | ORAL | 0 refills | Status: DC
Start: 2017-01-06 — End: 2020-10-14

## 2017-01-06 MED ORDER — INSULIN ASPART 100 UNIT/ML ~~LOC~~ SOLN: SUBCUTANEOUS | 0 refills | Status: DC

## 2017-01-06 MED ORDER — CYCLOBENZAPRINE HCL 5 MG PO TABS: 5.0000 mg | ORAL_TABLET | Freq: Three times a day (TID) | ORAL | 0 refills | Status: DC | PRN

## 2017-01-06 MED ORDER — HEPARIN SOD (PORK) LOCK FLUSH 100 UNIT/ML IV SOLN
250.0000 [IU] | INTRAVENOUS | Status: AC | PRN
  Administered 2017-01-06: 250 [IU]

## 2017-01-06 MED ORDER — DOXYCYCLINE HYCLATE 100 MG PO TABS
100.0000 mg | ORAL_TABLET | Freq: Two times a day (BID) | ORAL | 0 refills | Status: DC
Start: 2017-01-06 — End: 2017-02-02

## 2017-01-06 NOTE — Progress Notes (Signed)
Dereck LigasSandra Burton, RN, reviewed all discharge instructions with pt with understanding verbalized. Pt denies any questions or concerns prior to discharge. Nursing report called to receiving facility by Dyke BrackettSandra B., RN. Patient discharged to [facility] via stretcher by [facility] transportation in stable condition.

## 2017-01-06 NOTE — [place] FL2 (Signed)
[place] MEDICAID FL2 LEVEL OF CARE SCREENING TOOL     IDENTIFICATION  Patient Name: Keith Riggs Birthdate: 06/21/1990 Sex: male Admission Date (Current Location): 12/19/2016  [facility] [place]   Facility and Address:  [facility] [place], [address], [place], [place] [address]      Provider Number: [id number]  Attending Physician Name and Address:  Albertine GratesXu, Maor Meckel, MD  Relative Name and Phone Number:  [name], [phone]    Current Level of Care: Hospital Recommended Level of Care: Skilled Nursing Facility (For IV anitbiotic management) Prior Approval Number:    Date Approved/Denied:   PASRR Number: [id number]  Discharge Plan: SNF    Current Diagnoses: Patient Active Problem List   Diagnosis Date Noted  . Pain management   . Sepsis due to methicillin resistant Staphylococcus aureus (MRSA) (HCC) 12/22/2016  . DM2 (diabetes mellitus, type 2) (HCC) 12/20/2016  . IVDU (intravenous drug user) 12/20/2016  . DKA (diabetic ketoacidoses) (HCC) 12/20/2016  . Polysubstance abuse 12/20/2016  . Pleural effusion, bilateral 12/20/2016  . 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 Soft tissue abscess of inguinal region 12/20/2016  . Cigarette smoker 12/20/2016  . Normocytic anemia 12/20/2016  . Acute pyelonephritis 12/20/2016    Orientation RESPIRATION BLADDER Height & Weight     Self, Time, Situation, Place  O2 (Nasal Cannula;2L) Continent Weight: 235 lb 1.6 oz (106.6 kg) Height:  5\' 11"  (180.3 cm)  BEHAVIORAL SYMPTOMS/MOOD NEUROLOGICAL BOWEL NUTRITION STATUS      Continent  (Please see d/c summary)  AMBULATORY STATUS COMMUNICATION OF NEEDS Skin   Independent Verbally                         Personal Care Assistance Level of Assistance  Bathing, Feeding, Dressing Bathing Assistance: Independent Feeding assistance: Independent Dressing Assistance:  Independent     Functional Limitations Info  Sight, Hearing, Speech Sight Info: Adequate Hearing Info: Adequate Speech Info: Adequate    SPECIAL CARE FACTORS FREQUENCY   (IV antibiotic management)                    Contractures Contractures Info: Not present    Additional Factors Info  Code Status, Allergies, Isolation Precautions Code Status Info: Full Code Allergies Info: Bee Venom, Voltaren Diclofenac Sodium, Latex     Isolation Precautions Info: Contact; MRSA     Current Medications (01/06/2017):  This is the current hospital active medication list Current Facility-Administered Medications  Medication Dose Route Frequency Provider Last Rate Last Dose  . acetaminophen (TYLENOL) tablet 650 mg  650 mg Oral Q6H PRN Hillary BowGardner, Jared M, DO   650 mg at 12/26/16 2336  . bisacodyl (DULCOLAX) suppository 10 mg  10 mg Rectal Daily PRN Calvert Cantorizwan, Saima, MD   10 mg at 12/23/16 1557  . cyclobenzaprine (FLEXERIL) tablet 5 mg  5 mg Oral TID Rolly SalterPatel, Pranav M, MD   5 mg at 01/06/17 1610  . DAPTOmycin (CUBICIN) 860 mg in sodium chloride 0.9 % IVPB  8 mg/kg Intravenous Q24H Scarlett Prestogan, Theresa D, [facility]   Stopped at 01/05/17 1742  . doxycycline (VIBRA-TABS) tablet 100 mg  100 mg Oral Q12H Gardiner Barefootomer, Robert W, MD   100 mg at 01/06/17 1025  . enoxaparin (LOVENOX) injection 40 mg  40 mg Subcutaneous Q24H Ralene Muskraturpin, Pamela, PA-C   40 mg at 01/05/17 1120  . insulin aspart (novoLOG) injection  0-15 Units  0-15 Units Subcutaneous TID WC Rolly SalterPatel, Pranav M, MD   2 Units at 01/06/17 [phone]  . insulin aspart (novoLOG) injection 0-5 Units  0-5 Units Subcutaneous QHS Rolly SalterPatel, Pranav M, MD      . insulin aspart (novoLOG) injection 5 Units  5 Units Subcutaneous TID WC Rolly SalterPatel, Pranav M, MD   5 Units at 01/05/17 1711  . insulin glargine (LANTUS) injection 50 Units  50 Units Subcutaneous Daily Rolly SalterPatel, Pranav M, MD   50 Units at 01/06/17 1027  . morphine 2 MG/ML injection 2-4 mg  2-4 mg Intravenous Q4H PRN Rolly SalterPatel, Pranav M, MD   4 mg at  01/06/17 0503  . nicotine (NICODERM CQ - dosed in mg/24 hours) patch 21 mg  21 mg Transdermal Daily Schorr, Roma KayserKatherine P, NP   21 mg at 01/04/17 1014  . ondansetron (ZOFRAN) injection 4 mg  4 mg Intravenous Q6H PRN Hillary BowGardner, Jared M, DO      . oxyCODONE-acetaminophen (PERCOCET/ROXICET) 5-325 MG per tablet 1 tablet  1 tablet Oral Q3H PRN Rolly SalterPatel, Pranav M, MD   1 tablet at 01/06/17 1610  . polyethylene glycol (MIRALAX / GLYCOLAX) packet 17 g  17 g Oral Daily Rolly SalterPatel, Pranav M, MD   17 g at 12/28/16 1121  . senna-docusate (Senokot-S) tablet 2 tablet  2 tablet Oral BID Rolly SalterPatel, Pranav M, MD   2 tablet at 01/04/17 1014  . sodium chloride flush (NS) 0.9 % injection 10-40 mL  10-40 mL Intracatheter PRN Vassie LollMadera, Carlos, MD   10 mL at 01/06/17 1114  . zolpidem (AMBIEN) tablet 5 mg  5 mg Oral QHS PRN Rolly SalterPatel, Pranav M, MD   5 mg at 01/02/17 2355     Discharge Medications: Please see discharge summary for a list of discharge medications.  Relevant Imaging Results:  Relevant Lab Results:   Additional Information SSN: [id number]  Althea CharonAshley C Woods, LCSW

## 2017-01-06 NOTE — Discharge Summary (Addendum)
Discharge Summary  Keith Riggs ZSW:[id number] DOB: 08-26-1990  PCP: System, Pcp Not In  Admit date: 12/19/2016 Discharge date: 01/06/2017  Time spent: >32mns, more than 50% time spent on coordination of care  Recommendations for Outpatient Follow-up:  1. F/u with SNF MD for hospital discharge follow up, repeat cbc/bmp at follow up  2. F/u with infectious disease 3. F/u with thoracic surgery Dr GServando Snareon 8/9  Discharge Diagnoses:  Active Hospital Problems   Diagnosis Date Noted  . Sepsis due to methicillin resistant Staphylococcus aureus (MRSA) ([facility] [place] 12/22/2016  . Pain management   . DM2 (diabetes mellitus, type 2) ([facility] 12/20/2016  . IVDU (intravenous drug user) 12/20/2016  . DKA (diabetic ketoacidoses) ([facility] 12/20/2016  . Polysubstance abuse 12/20/2016  . Pleural effusion, bilateral 12/20/2016  . Osteomyelitis of thoracic spine ([facility] 12/20/2016  . 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 Soft tissue abscess of inguinal region 12/20/2016  . Cigarette smoker 12/20/2016  . Normocytic anemia 12/20/2016  . Acute pyelonephritis 12/20/2016    Resolved Hospital Problems   Diagnosis Date Noted Date Resolved  . Multiple lung nodules 12/20/2016 12/22/2016  . Sepsis ([facility] 12/20/2016 12/22/2016  . Abscess of back 12/20/2016 12/22/2016    Discharge Condition: stable  Diet recommendation: heart healthy/carb modified  Filed Weights   01/04/17 0425 01/05/17 0424 01/06/17 0450  Weight: 106.8 kg (235 lb 6.4 oz) 106.5 kg (234 lb 11.2 oz) 106.6 kg (235 lb 1.6 oz)    History of present illness:  PCP: No primary care provider on file.  [place] I have personally briefly reviewed [place]'s old medical [place] Chief Complaint: Back pain  HPI: [name] a 26y.o. male with medical history significant of IDDM, IVDU, cocaine most recently has done heroin in past.  Patient recently had 2 groin abscesses drained  in hospital (one 2 days ago and one yesterday).  He also has been having severe back pain across mid back for the past 2 weeks.  Worsening.  Also L sided chest pain with cough.  Loss of appetite x2-3 days.  States last IVDU was 2 months ago.   [facility], initial HR 139, Tm 39.x, CXR shows LLL PNA, possibly LUL PNA too, has firm tender knot / abscess on back, [facility]  BGL in the 200s, but patient has >80 keytones in urine and anion gap of 20.  Bicarb is 20, and Lactate is only 1.x.  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 -Patient now has PICC line placed -d/c to SNF Given  history of IV drug abuse. -follow up with infectious disease  #Hyponatremia due to SIADH: Patient may developed SIADH in the setting of pain -resolved.  #Hypokalemia/hypomagnesemia , replaced. He has good oral intake.  #Insulin-dependent diabetes: a1c11.9,  Continue current insulin regimen. Resume metformin, Monitor blood sugar level.  #Pain management: he received IV morphine,  oxycodone as needed and bowel regimen. Continue muscle relaxant with Flexeril. Education provided to the patient to try to wean narcotics.  #IV drug abuse: Education provided to the patient.  #Obesity: Low calorie diet recommended Body mass index is 32.73 kg/m.   DVT prophylaxis while in the hospital: Lovenox subcutaneous Code Status: Full code Family Communication: No family at bedside Disposition Plan: discharge to SNF for iv abx due to h/o IVDU    Consultants:   Infectious disease  Neurosurgery  Nephrology  Cardiothoracic surgery  Intravascular radiology  Procedures:  See below for x-ray reports   Paraspinal percutaneous drainage by IR  Chest tube by CVTS  picc line placement  Antimicrobials: Daptomycin and doxycycline     Discharge Exam: BP 122/68 (BP Location: Left Arm)   Pulse 97   Temp 98.1 F (36.7 C) (Oral)   Resp (!) 23   Ht _0  (1.803 m)   Wt 106.6 kg (235 lb 1.6 oz)   SpO2 95%   BMI 32.79 kg/m   General: NAD, sitting in bed , working on coloring book, picc line in place. Cardiovascular: RRR Respiratory: CTABL Skin: + tattoo   Discharge Instructions You were cared for by a hospitalist during your hospital stay. If you have any questions about your discharge medications or the care you received while you were in the hospital after you are discharged, you can call the unit and asked to speak with the hospitalist on call if the hospitalist that took care of you is not available. Once you are discharged, your primary care physician will handle any further medical issues. Please note that NO REFILLS for any discharge medications will be authorized once you are discharged, as it is imperative that you return to your primary care physician (or establish a relationship with a primary care physician if you do not have one) for your aftercare needs so that they can reassess your need for medications and monitor your lab values.  Discharge  Instructions    Diet Carb Modified    Complete by:  As directed    Home infusion instructions [facility] May follow [place] Dosing Protocol; May administer Cathflo as needed to maintain patency of vascular access device.; Flushing of vascular access device: per [facility] Protocol: 0.9% NaCl pre/post medica...    Complete by:  As directed    Instructions:  May follow [place] Dosing Protocol   Instructions:  May administer Cathflo as needed to maintain patency of vascular access device.   Instructions:  Flushing of vascular access device: per [facility] Protocol: 0.9% NaCl pre/post medication administration and prn patency; Heparin 100 u/ml, 66m for implanted ports and Heparin 10u/ml, 533mfor all other central venous catheters.   Instructions:  May follow [facility] Anaphylaxis Protocol for First Dose Administration in the home: 0.9% NaCl at 25-50 ml/hr to maintain IV access for protocol meds. Epinephrine 0.3 ml IV/IM PRN and Benadryl 25-50 IV/IM PRN s/s of anaphylaxis.   Instructions:  [facility] Coordinator (RN) to assist per patient IV care needs in the home PRN.   Increase activity slowly    Complete by:  As directed  Allergies as of 01/06/2017      Reactions   Bee Venom Anaphylaxis   Voltaren [diclofenac Sodium] Other (See Comments)   "Makes me feel not myself"   Latex Rash      Medication List    STOP taking these medications   cephALEXin 500 MG capsule Commonly known as:  KEFLEX     TAKE these medications   cyclobenzaprine 5 MG tablet Commonly known as:  FLEXERIL Take 1 tablet (5 mg total) by mouth 3 (three) times daily as needed for muscle spasms.   daptomycin IVPB Commonly known as:  CUBICIN Inject 860 mg into the vein daily. Indication:  Disseminated MRSA infection  Last Day of Therapy:  02/03/17 Labs - Once weekly:  CBC/D, BMP, and CPK Labs - Every other week:  ESR and CRP   doxycycline 100 MG tablet Commonly known as:  VIBRA-TABS Take 1 tablet (100  mg total) by mouth every 12 (twelve) hours.   HYDROcodone-acetaminophen 5-325 MG tablet Commonly known as:  NORCO/VICODIN Take 1 tablet by mouth every 4 (four) hours as needed. for pain   insulin aspart 100 UNIT/ML injection Commonly known as:  novoLOG Before each meal 3 times a day, 140-199 - 2 units, 200-250 - 4 units, 251-299 - 6 units,  300-349 - 8 units,  350 or above 10 units. Insulin PEN if approved, provide syringes and needles if needed.   Insulin Glargine 300 UNIT/ML Sopn Commonly known as:  TOUJEO MAX SOLOSTAR Inject 50 Units into the skin daily. What changed:  how much to take   metFORMIN 500 MG tablet Commonly known as:  GLUCOPHAGE Take 500 mg by mouth 2 (two) times daily with a meal.   polyethylene glycol packet Commonly known as:  MIRALAX / GLYCOLAX Take 17 g by mouth daily.   senna-docusate 8.6-50 MG tablet Commonly known as:  Senokot-S Take 2 tablets by mouth at bedtime.            Home Infusion Instuctions        Start     Ordered   01/06/17 0000  Home infusion instructions [facility] May follow [place] Dosing Protocol; May administer Cathflo as needed to maintain patency of vascular access device.; Flushing of vascular access device: per [facility] Protocol: 0.9% NaCl pre/post medica...    Question Answer Comment  Instructions May follow [place] Dosing Protocol   Instructions May administer Cathflo as needed to maintain patency of vascular access device.   Instructions Flushing of vascular access device: per [facility] Protocol: 0.9% NaCl pre/post medication administration and prn patency; Heparin 100 u/ml, 85m for implanted ports and Heparin 10u/ml, 535mfor all other central venous catheters.   Instructions May follow [facility] Anaphylaxis Protocol for First Dose Administration in the home: 0.9% NaCl at 25-50 ml/hr to maintain IV access for protocol meds. Epinephrine 0.3 ml IV/IM PRN and Benadryl 25-50 IV/IM PRN s/s of anaphylaxis.   Instructions [facility] Infusion Coordinator (RN) to assist per patient IV care needs in the home PRN.      01/06/17 1102     Allergies  Allergen Reactions  . Bee Venom Anaphylaxis  . Voltaren [Diclofenac Sodium] Other (See Comments)    "Makes me feel not myself"  . Latex Rash   Follow-up Information    GeGrace IsaacMD Follow up on 01/15/2017.   Specialty:  Cardiothoracic Surgery Why:  PA/LAT CXR [facility] be taken [facility] Dr.  Gerhardt[facility] office) on 01/15/2017 at 10:30 am;Appointment time is at 11:00 am Contact information: [address] [place] [address] [phone]        [facility]              Follow up.   Why:  for mrsa bateremia Contact information: [address] [place] [address]           The results of significant diagnostics from this hospitalization (including imaging, microbiology, ancillary and laboratory) are listed below for reference.    Significant Diagnostic Studies: Dg Chest 2 View  Result Date: 01/01/2017 CLINICAL DATA:  Shortness of breath, pleural effusion. EXAM: CHEST  2 VIEW COMPARISON:  Radiographs of December 31, 2016. FINDINGS: The heart size and mediastinal contours are within normal limits. Stable right upper lobe airspace opacity is noted concerning for possible infiltrate. Stable loculated pleural effusion is seen along left chest wall. Stable air component is noted within this effusion. Left basilar atelectasis or infiltrate cannot be excluded. The visualized skeletal structures are unremarkable. IMPRESSION: Stable loculated left pleural effusion. Stable gas component of this effusion is noted laterally. Stable right upper lobe and left lower lobe opacities are noted. Electronically Signed   By: Marijo Conception, M.D.   On: 01/01/2017 07:56   Dg Chest 2 View  Result Date: 12/31/2016 CLINICAL DATA:  Pleural effusion, shortness of Breath  EXAM: CHEST  2 VIEW COMPARISON:  12/30/2016 FINDINGS: Small amount of pleural air again noted on the left. Small left pleural effusion, with loculated effusion projecting over the left lateral upper lobe. Nodular airspace disease throughout the left lung and in the right upper lobe. IMPRESSION: Stable loculated left effusion and small loculated left pneumothorax. Stable nodular airspace disease bilaterally. Electronically Signed   By: Rolm Baptise M.D.   On: 12/31/2016 10:24   Dg Chest 2 View  Result Date: 12/30/2016 CLINICAL DATA:  Chest tube removal EXAM: CHEST  2 VIEW COMPARISON:  Portable chest x-ray of 12/29/2016 and CT chest of 12/23/2016 FINDINGS: A small amount of pleural air is noted peripherally in the left mid upper hemithorax. Adjacent pleural thickening consistent with loculated fluid remains as well. Mild left basilar linear atelectasis is present. There may be a tiny right pleural effusion. The nodule in the right upper lung field is again visualized. Heart size is stable. No acute bony abnormality is seen. IMPRESSION: 1. Suspect small left pneumothorax with a small amount of pleural air noted. 2. Loculated pleural fluid again noted with small effusions bilaterally. 3. No change in right upper lobe lung nodule. Electronically Signed   By: Ivar Drape M.D.   On: 12/30/2016 08:58   Dg Chest 2 View  Result Date: 12/29/2016 CLINICAL DATA:  Empyema. EXAM: CHEST  2 VIEW COMPARISON:  Radiograph of December 27, 2016.  CT scan of December 23, 2016. FINDINGS: The heart size and mediastinal contours are within normal limits. No pneumothorax is noted. Stable right apical densities are noted most consistent with pneumonia or other inflammation. Two left-sided pleural drainage catheters are again noted and unchanged. Stable small loculated pleural effusions are noted along the left lateral chest wall. The visualized skeletal structures are unremarkable. IMPRESSION: Stable position of 2 left-sided pleural drainage  catheters, with stable small loculated pleural effusions noted along left lateral chest wall. Stable right apical densities are noted most consistent with pneumonia or other inflammation. Electronically Signed   By: Marijo Conception, M.D.  On: 12/29/2016 07:34   Ct Chest W Contrast  Result Date: 12/23/2016 CLINICAL DATA:  Status post percutaneous catheter drainage of left chest empyema on 12/20/2016. Chest x-ray demonstrates probable undrained component of loculated empyema in the upper hemithorax. EXAM: CT CHEST WITH CONTRAST TECHNIQUE: Multidetector CT imaging of the chest was performed during intravenous contrast administration. CONTRAST:  < 75 mL > ISOVUE-300 IOPAMIDOL (ISOVUE-300) INJECTION 61% COMPARISON:  CT of the chest on 12/20/2016 FINDINGS: Cardiovascular: Stable and normal heart size. Thoracic aorta and central pulmonary arteries are unremarkable. Trace pericardial fluid towards the base of the heart. Mediastinum/Nodes: Several borderline to mildly enlarged upper mediastinal lymph nodes again noted which are likely reactive and appear unchanged compared to the prior CT. Lungs/Pleura: Multiple partially cavitary nodules consistent with septic emboli scattered throughout both lungs appear grossly stable since the prior CT. Pigtail drainage catheter extends from the lateral chest wall into post a lateral empyema. This appears smaller in overall volume compared to the prior study with some fluid remaining as well as air in the loculated collection. Undrained loculated component of empyema along the superior and anterior hemithorax shows enlargement since the prior CT and now measures approximately 5.6 cm in greatest width compared to 4.6 cm at the same level 3 days ago. Undrained medial component of empyema adjacent to the mediastinum and extending to abut the pericardium and left heart border also appears enlarged. At the level of the superior aortic arch this now measures 2.6 cm in thickness compared  to 1.5 cm previously. Basilar component abutting the pericardium near the left ventricle is also larger and measures approximately 5.6 cm in thickness compared to 3.9 cm previously. Small right-sided pleural effusion appears stable. Upper Abdomen: No acute abnormality. Musculoskeletal: Paraspinal inflammation and multiple small fluid collections in the left posterior paraspinous musculature appear grossly stable. IMPRESSION: Decrease in size of the drained posterolateral left-sided empyema. Undrained loculated components along the superior and anterior hemithorax as well as the medial hemithorax abutting the mediastinum and pericardium have enlarged since the prior scan. Multiple partially cavitary septic emboli scattered throughout both lungs are grossly stable. Paraspinal inflammation with multiple small paraspinal abscesses appear grossly stable. Electronically Signed   By: Aletta Edouard M.D.   On: 12/23/2016 16:29   Ct Chest W Contrast  Result Date: 12/20/2016 CLINICAL DATA:  Worsening left back pain over the past 2 weeks with nausea and vomiting. Thoracic spine MR earlier today demonstrating multifocal epidural abscess with paraspinal myositis and paraspinal muscle abscesses and bilateral pleural empyema with probable pneumonia and possible lung abscesses. Chest CT was recommended. 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 EXAM: CT CHEST, ABDOMEN, AND PELVIS WITH CONTRAST TECHNIQUE: Multidetector CT imaging of the chest, abdomen and pelvis was performed following the standard protocol during bolus administration of intravenous contrast. CONTRAST:  169m ISOVUE-300 IOPAMIDOL (ISOVUE-300) INJECTION 61% COMPARISON:  Thoracic spine MR obtained earlier today. FINDINGS: CT CHEST FINDINGS Cardiovascular: No significant vascular findings. Normal heart size. No pericardial effusion. Mediastinum/Nodes: Prominent AP window lymph node with a short axis diameter of 11 mm on image number 27 of series  3. Mildly prominent precarinal node with a short axis diameter of 8 mm on image number 46 of series 3. Lungs/Pleura: Large number of bilateral lung nodules, most numerous on the right. Some of these demonstrate central cavitation. The largest on the left is at the medial left lung apex, measuring 1.7 cm in maximum diameter on image number 39 of series 4. The largest on  the right is in the posterior aspect of the right upper lobe, with a maximum diameter of 1.6 cm on image number 53 of series 4. Small right pleural effusion and mild associated right lower lobe atelectasis. Moderate-sized multiloculated left pleural effusion and moderate compressive and dependent atelectasis. Musculoskeletal: Previously described left paraspinal muscle abscess. This is largest on image number 32 of series 3, measuring 3.9 x 2.3 cm on that image. There is a more discrete abscess with rim enhancement on the left on image number 36 of series 3, measuring 2.0 x 1.4 cm. The previously demonstrated multifocal epidural abscess is better visualized on the MR images. No bone destruction seen. Mild thoracic and lower cervical spine degenerative changes. CT ABDOMEN PELVIS FINDINGS Hepatobiliary: No focal liver abnormality is seen. No gallstones, gallbladder wall thickening, or biliary dilatation. Pancreas: Unremarkable. No pancreatic ductal dilatation or surrounding inflammatory changes. Spleen: Normal in size without focal abnormality. Adrenals/Urinary Tract: Both kidneys are mildly enlarged with poorly defined areas low density. There is a very small medial right perinephric fluid collection measuring 1.9 x 1.1 cm on image number 76 series 3. Minimal bilateral perinephric soft tissue stranding. Unremarkable ureters, urinary bladder and adrenal glands. No hydronephrosis. Stomach/Bowel: Stomach is within normal limits. Appendix appears normal. No evidence of bowel wall thickening, distention, or inflammatory changes. Vascular/Lymphatic: No  significant vascular findings are present. No enlarged abdominal or pelvic lymph nodes. Reproductive: Prostate is unremarkable. Other: Very small bilateral inguinal hernias containing fat. Musculoskeletal: The left paraspinal muscle abscesses extend to the level of the upper abdomen. The largest portion of the abscess at that level measures 4.3 x 2.7 cm on image number 58 of series 3. No bone destruction is seen. An L1 vertebral body hemangioma is incidentally noted. There is soft tissue gas in the inferior, medial left buttock region subcutaneous fat on the most inferior images with no fluid collection seen on those images. IMPRESSION: 1. Multiple cavitary nodules in both lungs, compatible with septic emboli. 2. Moderate-sized multiloculated left pleural effusion. The multiple loculations are compatible with empyema formation. 3. Previously described epidural and left paraspinal abscesses. 4. Bilateral lung atelectasis, greater on the left. 5. Soft tissue gas in the inferior, medial left buttock region, compatible with the patient's known groin abscesses. 6. Evidence of bilateral pyelonephritis with a small right perinephric abscess or small flattened exophytic cyst. 7. Minimal mediastinal adenopathy, most likely reactive. Electronically Signed   By: Claudie Revering M.D.   On: 12/20/2016 09:11   Mr Thoracic Spine W Wo Contrast  Result Date: 12/20/2016 CLINICAL DATA:  Severe mid back pain for 2 weeks. History of diabetes , intravenous drug use and recent groin abscess. EXAM: MRI THORACIC WITHOUT AND WITH CONTRAST TECHNIQUE: Multiplanar and multiecho pulse sequences of the thoracic spine were obtained without and with intravenous contrast. CONTRAST:  3m MULTIHANCE GADOBENATE DIMEGLUMINE 529 MG/ML IV SOLN COMPARISON:  Thoracic spine radiograph December 15, 2016 FINDINGS: Generally poor signal to noise ratio due to habitus and motion. The axial sequences of the lower thoracic spine are nondiagnostic. ALIGNMENT:  Straightened thoracic kyphosis. No malalignment. VERTEBRAE/DISCS: Vertebral bodies are intact. Intervertebral discs morphology and signal are normal. Faintly increased STIR signal and enhancement T6 vertebral body. No abnormal disc enhancement. CORD: 1.7 x 1.3 x 7.8 cm (transverse by AP by CC) ventral epidural abscess from T4-5 through T8-9 resulting in severe cord compression T5-6 through T7 nondiagnostic examination for spinal cord edema. No syrinx. LEFT ventral epidural abscess from T2 to T5-6, 7.1 cm in  cranial caudad dimension resulting in mild cord compression. PREVERTEBRAL AND PARASPINAL SOFT TISSUES: Partially imaged lobulated, potentially loculated LEFT pleural effusion. Patchy LEFT lung consolidation. T2 bright signal within the bilateral paraspinal muscles compatible with mild bursitis. Complex 4.5 x 2.5 x 13.8 cm LEFT paraspinal muscle abscess from T5 through T12. 0.8 x 0.8 x 8.3 cm RIGHT paraspinal muscle abscess at the level of the epidural collection. Prevertebral subpleural abscesses bilaterally measuring to at least 1.4 x 2.4 cm on the LEFT. IMPRESSION: 1. Limited examination, nondiagnostic evaluation through the lower thoracic spine. 2. 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 No syrinx. 3. 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 4. Paraspinal myositis with paraspinal muscle abscesses, LEFT greater than RIGHT. 5. Bilateral pleural empyema and, probable pneumonia. Lung abscess is possible. Recommend CT chest with contrast. Critical Value/emergent results were called by telephone at the time of interpretation on 12/20/2016 at 5:21 am to Dr. Jola Schmidt, ED, who verbally acknowledged these results. Electronically Signed   By: Elon Alas M.D.   On: 12/20/2016 05:25   Ct Abdomen Pelvis W Contrast  Result Date: 12/20/2016 CLINICAL DATA:  Worsening left back pain over the past 2 weeks with  nausea and vomiting. Thoracic spine MR earlier today demonstrating multifocal epidural abscess with paraspinal myositis and paraspinal muscle abscesses and bilateral pleural empyema with probable pneumonia and possible lung abscesses. Chest CT was recommended. 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 EXAM: CT CHEST, ABDOMEN, AND PELVIS WITH CONTRAST TECHNIQUE: Multidetector CT imaging of the chest, abdomen and pelvis was performed following the standard protocol during bolus administration of intravenous contrast. CONTRAST:  1110m ISOVUE-300 IOPAMIDOL (ISOVUE-300) INJECTION 61% COMPARISON:  Thoracic spine MR obtained earlier today. FINDINGS: CT CHEST FINDINGS Cardiovascular: No significant vascular findings. Normal heart size. No pericardial effusion. Mediastinum/Nodes: Prominent AP window lymph node with a short axis diameter of 11 mm on image number 27 of series 3. Mildly prominent precarinal node with a short axis diameter of 8 mm on image number 46 of series 3. Lungs/Pleura: Large number of bilateral lung nodules, most numerous on the right. Some of these demonstrate central cavitation. The largest on the left is at the medial left lung apex, measuring 1.7 cm in maximum diameter on image number 39 of series 4. The largest on the right is in the posterior aspect of the right upper lobe, with a maximum diameter of 1.6 cm on image number 53 of series 4. Small right pleural effusion and mild associated right lower lobe atelectasis. Moderate-sized multiloculated left pleural effusion and moderate compressive and dependent atelectasis. Musculoskeletal: Previously described left paraspinal muscle abscess. This is largest on image number 32 of series 3, measuring 3.9 x 2.3 cm on that image. There is a more discrete abscess with rim enhancement on the left on image number 36 of series 3, measuring 2.0 x 1.4 cm. The previously demonstrated multifocal epidural abscess is better visualized on the MR  images. No bone destruction seen. Mild thoracic and lower cervical spine degenerative changes. CT ABDOMEN PELVIS FINDINGS Hepatobiliary: No focal liver abnormality is seen. No gallstones, gallbladder wall thickening, or biliary dilatation. Pancreas: Unremarkable. No pancreatic ductal dilatation or surrounding inflammatory changes. Spleen: Normal in size without focal abnormality. Adrenals/Urinary Tract: Both kidneys are mildly enlarged with poorly defined areas low density. There is a very small medial right perinephric fluid collection measuring 1.9 x 1.1 cm on image number  76 series 3. Minimal bilateral perinephric soft tissue stranding. Unremarkable ureters, urinary bladder and adrenal glands. No hydronephrosis. Stomach/Bowel: Stomach is within normal limits. Appendix appears normal. No evidence of bowel wall thickening, distention, or inflammatory changes. Vascular/Lymphatic: No significant vascular findings are present. No enlarged abdominal or pelvic lymph nodes. Reproductive: Prostate is unremarkable. Other: Very small bilateral inguinal hernias containing fat. Musculoskeletal: The left paraspinal muscle abscesses extend to the level of the upper abdomen. The largest portion of the abscess at that level measures 4.3 x 2.7 cm on image number 58 of series 3. No bone destruction is seen. An L1 vertebral body hemangioma is incidentally noted. There is soft tissue gas in the inferior, medial left buttock region subcutaneous fat on the most inferior images with no fluid collection seen on those images. IMPRESSION: 1. Multiple cavitary nodules in both lungs, compatible with septic emboli. 2. Moderate-sized multiloculated left pleural effusion. The multiple loculations are compatible with empyema formation. 3. Previously described epidural and left paraspinal abscesses. 4. Bilateral lung atelectasis, greater on the left. 5. Soft tissue gas in the inferior, medial left buttock region, compatible with the patient's  known groin abscesses. 6. Evidence of bilateral pyelonephritis with a small right perinephric abscess or small flattened exophytic cyst. 7. Minimal mediastinal adenopathy, most likely reactive. Electronically Signed   By: Claudie Revering M.D.   On: 12/20/2016 09:11   Dg Chest Port 1 View  Result Date: 12/29/2016 CLINICAL DATA:  Post chest tube removal film EXAM: PORTABLE CHEST - 1 VIEW COMPARISON:  Earlier film of the same day a FINDINGS: 2 left pigtail catheters have been removed. No pneumothorax. Persistent left lateral pleural thickening or loculated effusion. Focal right upper lobe airspace disease persists. Heart size and mediastinal contours are within normal limits. Visualized bones unremarkable. IMPRESSION: 1. Removal of 2 left chest tubes with no pneumothorax. 2. Residual left pleural thickening or loculated effusion. 3. Focal airspace infiltrate in the right upper lobe as before. Electronically Signed   By: Lucrezia Europe M.D.   On: 12/29/2016 15:40   Dg Chest Port 1 View  Result Date: 12/27/2016 CLINICAL DATA:  Empyema EXAM: PORTABLE CHEST 1 VIEW COMPARISON:  Two days ago FINDINGS: Two left-sided pigtail catheters. The more superior catheter is more inferiorly positioned today. Small volume left chest with unchanged loculation at the apex. Right chest remains clear. Heart size is accentuated by loculations along the left heart border that appear diminished from prior chest x-ray. IMPRESSION: 1. Apparent decrease in pleural collections along the left heart border. 2. Stable loculated pleural thickening and fluid at the left apex. An associated pleural catheter is more inferiorly positioned today. Electronically Signed   By: Monte Fantasia M.D.   On: 12/27/2016 08:24   Dg Chest Port 1 View  Result Date: 12/25/2016 CLINICAL DATA:  Status post chest tube placement EXAM: PORTABLE CHEST 1 VIEW COMPARISON:  12/24/2016 FINDINGS: Cardiac shadow remains enlarged. The lungs are well aerated bilaterally without  focal infiltrate. Two left-sided chest tubes are now seen with significant reduction in the more superior loculated fluid collection. Only mild residual fluid remains. No new focal abnormality is noted. IMPRESSION: Significant improvement in left-sided fluid collection following drainage catheter placement. Electronically Signed   By: Inez Catalina M.D.   On: 12/25/2016 08:16   Dg Chest Port 1 View  Result Date: 12/24/2016 CLINICAL DATA:  Follow-up chest tube EXAM: PORTABLE CHEST 1 VIEW COMPARISON:  12/23/2016 FINDINGS: Cardiac shadow remains enlarged. A left-sided thoracostomy catheter is again identified  and stable. The collection surrounding the drainage catheter appears to continue to decrease when compare with the prior exam. Loculated superior component is again identified and stable. The right lung remains clear. IMPRESSION: Stable loculated effusions superiorly on the left. Stable left drainage tube with overall decrease in more inferior collection. Electronically Signed   By: Inez Catalina M.D.   On: 12/24/2016 08:29   Dg Chest Port 1 View  Result Date: 12/23/2016 CLINICAL DATA:  Left-sided chest tube . EXAM: PORTABLE CHEST 1 VIEW COMPARISON:  12/22/2016. FINDINGS: Mediastinum is stable. Left chest tube in stable position. No focal infiltrate. Loculated left-sided pleural effusion again noted without change from prior exam. No pneumothorax. Stable cardiomegaly. IMPRESSION: 1. Left-sided chest tube is in stable position. Stable loculated left pleural effusion. Chest is unchanged from prior exam. No pneumothorax. 2. Stable cardiomegaly . Electronically Signed   By: Marcello Moores  Register   On: 12/23/2016 07:24   Dg Chest Port 1 View  Result Date: 12/22/2016 CLINICAL DATA:  Chest tube. EXAM: PORTABLE CHEST 1 VIEW COMPARISON:  12/21/2016 .  CT 12/20/2016. FINDINGS: Left chest tube in stable position. Loculated left apical pleural effusion improved from prior exam. Mild bibasilar subsegmental atelectasis.  Stable cardiomegaly. IMPRESSION: 1. Left chest tube in stable position. Loculated left sided pleural effusion again noted, interim improvement from prior exam. No pneumothorax. 2.  Stable cardiomegaly. Electronically Signed   By: Marcello Moores  Register   On: 12/22/2016 07:23   Dg Chest Port 1 View  Result Date: 12/21/2016 CLINICAL DATA:  Empyema. EXAM: PORTABLE CHEST 1 VIEW COMPARISON:  CT 12/20/2016 FINDINGS: Loculated left pleural effusion again noted, similar to prior CT. Left pleural pigtail drainage catheter noted. No pneumothorax. Diffuse bilateral airspace disease. Cardiomegaly. IMPRESSION: Large loculated left pleural effusion with left pleural drainage catheter in place. No pneumothorax. Bilateral airspace disease. Electronically Signed   By: Rolm Baptise M.D.   On: 12/21/2016 10:04   Ct Image Guided Fluid Drain By Catheter  Result Date: 12/20/2016 INDICATION: Left empyema EXAM: CT GUIDED 14 FRENCH LEFT CHEST TUBE INSERTION MEDICATIONS: The patient is currently admitted to the hospital and receiving intravenous antibiotics. The antibiotics were administered within an appropriate time frame prior to the initiation of the procedure. ANESTHESIA/SEDATION: Fentanyl 100 mcg IV; Versed 2.0 mg IV Moderate Sedation Time:  10 minutes The patient was continuously monitored during the procedure by the interventional radiology nurse under my direct supervision. COMPLICATIONS: None immediate. PROCEDURE: Informed written consent was obtained from the patient after a thorough discussion of the procedural risks, benefits and alternatives. All questions were addressed. Maximal Sterile Barrier Technique was utilized including caps, mask, sterile gowns, sterile gloves, sterile drape, hand hygiene and skin antiseptic. A timeout was performed prior to the initiation of the procedure. Previous imaging reviewed. Patient positioned slightly left anterior oblique. Noncontrast localization CT performed. The loculated complex left  pleural effusion was localized. A mid intercostal space was marked in the mid axillary line. Under sterile conditions and local anesthesia, an 18 gauge 10 cm access needle was advanced under CT guidance into the left chest fluid collection. Needle position confirmed with CT. Syringe aspiration yielded purulent fluid. Amplatz guidewire inserted followed by tract dilatation to insert 14 [place] drain. Drain catheter position confirmed with CT. Catheter secured with a Prolene suture and connected to Pleur-Evac externally. Sterile dressing applied. Patient tolerated the procedure well. No immediate complication. IMPRESSION: Successful CT-guided left chest 14 French drain for empyema. Culture sent. Electronically Signed   By: Jerilynn Mages.  Shick M.D.  On: 12/20/2016 16:30   Ct Image Guided Drainage By Percutaneous Catheter  Result Date: 12/24/2016 INDICATION: EMPYEMA, PERSISTENT LOCULATED LEFT PLEURAL COLLECTION EXAM: CT GUIDED DRAINAGE OF LEFT ANTERIOR EMPYEMA MEDICATIONS: The patient is currently admitted to the hospital and receiving intravenous antibiotics. The antibiotics were administered within an appropriate time frame prior to the initiation of the procedure. ANESTHESIA/SEDATION: 2.0 mg IV Versed 100 mcg IV Fentanyl Moderate Sedation Time:  15 MINUTES The patient was continuously monitored during the procedure by the interventional radiology nurse under my direct supervision. COMPLICATIONS: None immediate. TECHNIQUE: Informed written consent was obtained from the patient after a thorough discussion of the procedural risks, benefits and alternatives. All questions were addressed. Maximal Sterile Barrier Technique was utilized including caps, mask, sterile gowns, sterile gloves, sterile drape, hand hygiene and skin antiseptic. A timeout was performed prior to the initiation of the procedure. PROCEDURE: Previous imaging reviewed. Patient positioned supine. Noncontrast localization CT performed. The residual anterior  loculated pleural fluid collection was localized. Overlying skin marked for an anterior approach through the second/third intercostal space. Under sterile conditions and local anesthesia, an 18 gauge access needle was advanced percutaneously under direct CT guidance into the fluid. Syringe aspiration yielded serosanguineous fluid. Sample sent for culture. Guidewire inserted followed by tract dilatation to advance a 12 [place] drain. Retention loop formed in the collection and confirmed with CT. Syringe aspiration yielded 30 cc fluid. Catheter secured with a Prolene suture and connected to Pleur-Evac externally. Sterile dressing applied. No immediate complication. Patient tolerated the procedure well. FINDINGS: Imaging confirms CT-guided needle placement into the anterior loculated left pleural effusion compatible with empyema for drain insertion. IMPRESSION: Successful CT-guided left anterior empyema drain insertion. Electronically Signed   By: Jerilynn Mages.  Shick M.D.   On: 12/24/2016 17:01    Microbiology: No results found for this or any previous visit (from the past 240 hour(s)).   Labs: Basic Metabolic Panel:  Recent Labs Lab 12/31/16 0432 01/01/17 0347 01/06/17 0449  NA 135 133* 135  K 4.6 4.3 3.7  CL 98* 96* 99*  CO2 _0 GLUCOSE 100* 92 143*  BUN _1 CREATININE 0.53* 0.56* 0.48*  CALCIUM 9.4 9.7 9.0  MG 1.8  --  1.7   Liver Function Tests:  Recent Labs Lab 12/31/16 0432  AST 19  ALT 34  ALKPHOS 89  BILITOT 0.2*  PROT 7.7  ALBUMIN 2.8*   No results for input(s): LIPASE, AMYLASE in the last 168 hours. No results for input(s): AMMONIA in the last 168 hours. CBC:  Recent Labs Lab 12/31/16 0432 01/01/17 0347 01/06/17 0449  WBC 12.6* 10.6* 8.5  NEUTROABS 8.7*  --   --   HGB 11.6* 11.1* 10.3*  HCT 35.5* 36.1* 32.3*  MCV 84.1 84.0 82.2  PLT 722* 665* 474*   Cardiac Enzymes:  Recent Labs Lab 01/05/17 0432  CKTOTAL 31*   BNP: BNP (last 3 results) No results  for input(s): BNP in the last 8760 hours.  ProBNP (last 3 results) No results for input(s): PROBNP in the last 8760 hours.  CBG:  Recent Labs Lab 01/05/17 1131 01/05/17 1650 01/05/17 2042 01/06/17 0601 01/06/17 1114  GLUCAP 118* 206* 93 147* 109*       Signed:  Catheleen Langhorne MD, PhD  [facility] 01/06/2017, 11:25 AM

## 2017-01-06 NOTE — Progress Notes (Signed)
Clinical Social Worker facilitated patient discharge including contacting patient family and facility to confirm patient discharge plans.  Clinical information faxed to facility and family agreeable with plan.  CSW arranged ambulance transport via [facility] to [facility] .  RN Dois DavenportSandra to call [phone] (pt will be placed in rm#[id number] window bed) for report prior to discharge.  Clinical Social Worker will sign off for now as social work intervention is no longer needed. Please consult us again if new need arises.  Marrianne MoodAshley Keron Koffman, MSW, [employer]CSWA [phone]

## 2017-01-06 NOTE — [place] FL2 (Signed)
[place] MEDICAID FL2 LEVEL OF CARE SCREENING TOOL     IDENTIFICATION  Patient Name: Keith MountainKameron Sallie Birthdate: 12/02/1990 Sex: male Admission Date (Current Location): 12/19/2016  [facility] [place]   Facility and Address:  [facility], [address], [place], [place] [address]      Provider Number: [id number]  Attending Physician Name and Address:  Albertine GratesXu, Ajanae Virag, MD  Relative Name and Phone Number:  Greer PickerelKelly Zettlemoyer, [phone]    Current Level of Care: Hospital Recommended Level of Care: Skilled Nursing Facility (For IV anitbiotic management) Prior Approval Number:    Date Approved/Denied:   PASRR Number:    Discharge Plan: SNF    Current Diagnoses: Patient Active Problem List   Diagnosis Date Noted  . Pain management   . Sepsis due to methicillin resistant Staphylococcus aureus (MRSA) (HCC) 12/22/2016  . DM2 (diabetes mellitus, type 2) (HCC) 12/20/2016  . IVDU (intravenous drug user) 12/20/2016  . DKA (diabetic ketoacidoses) (HCC) 12/20/2016  . Polysubstance abuse 12/20/2016  . Pleural effusion, bilateral 12/20/2016  . 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 Soft tissue abscess of inguinal region 12/20/2016  . Cigarette smoker 12/20/2016  . Normocytic anemia 12/20/2016  . Acute pyelonephritis 12/20/2016    Orientation RESPIRATION BLADDER Height & Weight     Self, Time, Situation, Place  O2 (Nasal Cannula;2L) Continent Weight: 235 lb 1.6 oz (106.6 kg) Height:  5\' 11"  (180.3 cm)  BEHAVIORAL SYMPTOMS/MOOD NEUROLOGICAL BOWEL NUTRITION STATUS      Continent  (Please see d/c summary)  AMBULATORY STATUS COMMUNICATION OF NEEDS Skin   Independent Verbally                         Personal Care Assistance Level of Assistance  Bathing, Feeding, Dressing Bathing Assistance: Independent Feeding assistance: Independent Dressing Assistance: Independent      Functional Limitations Info  Sight, Hearing, Speech Sight Info: Adequate Hearing Info: Adequate Speech Info: Adequate    SPECIAL CARE FACTORS FREQUENCY   (IV antibiotic management)                    Contractures Contractures Info: Not present    Additional Factors Info  Code Status, Allergies, Isolation Precautions Code Status Info: Full Code Allergies Info: Bee Venom, Voltaren Diclofenac Sodium, Latex     Isolation Precautions Info: Contact; MRSA     Current Medications (01/06/2017):  This is the current hospital active medication list Current Facility-Administered Medications  Medication Dose Route Frequency Provider Last Rate Last Dose  . acetaminophen (TYLENOL) tablet 650 mg  650 mg Oral Q6H PRN Hillary BowGardner, Jared M, DO   650 mg at 12/26/16 2336  . bisacodyl (DULCOLAX) suppository 10 mg  10 mg Rectal Daily PRN Calvert Cantorizwan, Saima, MD   10 mg at 12/23/16 1557  . cyclobenzaprine (FLEXERIL) tablet 5 mg  5 mg Oral TID Rolly SalterPatel, Pranav M, MD   5 mg at 01/06/17 1025  . DAPTOmycin (CUBICIN) 860 mg in sodium chloride 0.9 % IVPB  8 mg/kg Intravenous Q24H Scarlett Prestogan, Theresa D, [facility] CenterRPH   Stopped at 01/05/17 1742  . doxycycline (VIBRA-TABS) tablet 100 mg  100 mg Oral Q12H Gardiner Barefootomer, Robert W, MD   100 mg at 01/06/17 1025  . enoxaparin (LOVENOX) injection 40 mg  40 mg Subcutaneous Q24H Ralene Muskraturpin, Pamela, PA-C   40 mg at 01/05/17 1120  . insulin aspart (novoLOG)  injection 0-15 Units  0-15 Units Subcutaneous TID WC Rolly SalterPatel, Pranav M, MD   2 Units at 01/06/17 [phone]  . insulin aspart (novoLOG) injection 0-5 Units  0-5 Units Subcutaneous QHS Rolly SalterPatel, Pranav M, MD      . insulin aspart (novoLOG) injection 5 Units  5 Units Subcutaneous TID WC Rolly SalterPatel, Pranav M, MD   5 Units at 01/05/17 1711  . insulin glargine (LANTUS) injection 50 Units  50 Units Subcutaneous Daily Rolly SalterPatel, Pranav M, MD   50 Units at 01/06/17 1027  . morphine 2 MG/ML injection 2-4 mg  2-4 mg Intravenous Q4H PRN Rolly SalterPatel, Pranav M, MD   4 mg at 01/06/17 0503   . nicotine (NICODERM CQ - dosed in mg/24 hours) patch 21 mg  21 mg Transdermal Daily Schorr, Roma KayserKatherine P, NP   21 mg at 01/04/17 1014  . ondansetron (ZOFRAN) injection 4 mg  4 mg Intravenous Q6H PRN Hillary BowGardner, Jared M, DO      . oxyCODONE-acetaminophen (PERCOCET/ROXICET) 5-325 MG per tablet 1 tablet  1 tablet Oral Q3H PRN Rolly SalterPatel, Pranav M, MD   1 tablet at 01/06/17 1026  . polyethylene glycol (MIRALAX / GLYCOLAX) packet 17 g  17 g Oral Daily Rolly SalterPatel, Pranav M, MD   17 g at 12/28/16 1121  . senna-docusate (Senokot-S) tablet 2 tablet  2 tablet Oral BID Rolly SalterPatel, Pranav M, MD   2 tablet at 01/04/17 1014  . sodium chloride flush (NS) 0.9 % injection 10-40 mL  10-40 mL Intracatheter PRN Vassie LollMadera, Carlos, MD   10 mL at 01/06/17 1114  . zolpidem (AMBIEN) tablet 5 mg  5 mg Oral QHS PRN Rolly SalterPatel, Pranav M, MD   5 mg at 01/02/17 2355     Discharge Medications: Please see discharge summary for a list of discharge medications.  Relevant Imaging Results:  Relevant Lab Results:   Additional Information SSN: [id number]  Althea CharonAshley C Woods, LCSW

## 2017-01-06 NOTE — Care Management Note (Signed)
Case Management Note Keith PieriniKristi Clea Dubach RN, BSN Unit 4E-Case Manager [phone]  Patient Details  Name: Keith Riggs MRN: [id number] Date of Birth: 09/30/1990  Subjective/Objective:   Pt admitted with sepsis secondary to MRSA with osteomyelitis of spine and abscess to epidural space and groins. Also has pl. Effusions s/p CT drains x2- not a VATS candidate at this time.- hx of IVDU              Action/Plan: PTA pt lived at home with family- will need prolonged coarse of IV abx- CSW to follow for potential need for SNF due to active IVDU. -  Expected Discharge Date:  01/06/17               Expected Discharge Plan:  Skilled Nursing Facility  In-House Referral:  Clinical Social Work  Discharge planning Services  CM Consult  Post Acute Care Choice:    Choice offered to:     DME Arranged:    DME Agency:     HH Arranged:    HH Agency:     Status of Service:  Completed, signed off  If discussed at [employer] Length of Stay Meetings, dates discussed:  7/19, 7/31  Discharge Disposition: skilled facility   Additional Comments: 01/06/17- 1000- Keith PieriniKristi Brandalyn Harting RN, CM- per CSW [facility]- plan to d/c today with Picc for prolonged IV abx-to SNF. - CSW following for placement needs.   Darrold SpanWebster, Tahari Clabaugh Hall, RN 01/06/2017, 3:41 PM

## 2017-01-07 ENCOUNTER — Encounter: Payer: Self-pay | Admitting: Internal Medicine

## 2017-01-07 ENCOUNTER — Other Ambulatory Visit: Payer: Self-pay

## 2017-01-07 ENCOUNTER — Non-Acute Institutional Stay (SKILLED_NURSING_FACILITY): Payer: Self-pay | Admitting: Internal Medicine

## 2017-01-07 DIAGNOSIS — R000 Tachycardia, unspecified: Secondary | ICD-10-CM

## 2017-01-07 DIAGNOSIS — F17210 Nicotine dependence, cigarettes, uncomplicated: Secondary | ICD-10-CM

## 2017-01-07 DIAGNOSIS — IMO0001 Reserved for inherently not codable concepts without codable children: Secondary | ICD-10-CM

## 2017-01-07 DIAGNOSIS — Z794 Long term (current) use of insulin: Secondary | ICD-10-CM

## 2017-01-07 DIAGNOSIS — E8809 Other disorders of plasma-protein metabolism, not elsewhere classified: Secondary | ICD-10-CM

## 2017-01-07 DIAGNOSIS — Z9189 Other specified personal risk factors, not elsewhere classified: Secondary | ICD-10-CM

## 2017-01-07 DIAGNOSIS — E1110 Type 2 diabetes mellitus with ketoacidosis without coma: Secondary | ICD-10-CM

## 2017-01-07 DIAGNOSIS — M4624 Osteomyelitis of vertebra, thoracic region: Secondary | ICD-10-CM

## 2017-01-07 MED ORDER — HYDROCODONE-ACETAMINOPHEN 5-325 MG PO TABS: 1.0000 | ORAL_TABLET | ORAL | 0 refills | Status: DC | PRN

## 2017-01-07 NOTE — Assessment & Plan Note (Signed)
Protein supplements as per Nutrition

## 2017-01-07 NOTE — Assessment & Plan Note (Signed)
Basal glargine insulin along with pre-meal sliding scale NovoLog

## 2017-01-07 NOTE — Assessment & Plan Note (Addendum)
I explained  risk of @ least 30 % of adverse reaction or possibly death if opioids are continued long-term.  Referred to: A Dumb Way to Die by Arcola Janskyavid Frenz , MD [facility]'s [facility], October 2017; available on line.

## 2017-01-07 NOTE — Patient Instructions (Signed)
See assessment and plan under each diagnosis in the problem list and acutely for this visit 

## 2017-01-07 NOTE — Progress Notes (Signed)
Provider: Unice Cobble MD  Location:   Progress Room Number: 139/W Place of Service:  SNF (31)  [id number] Hodnett, ? NP , Berniece Pap  Extended Emergency Contact Information Primary Emergency Contact: Nakama,Kelly Address: [address] [address] Johnnette Litter of [place] Phone: [phone] Relation: Father  Code Status: Full Code Goals of Care: Advanced Directive information Advanced Directives 01/07/2017  Does Patient Have a Medical Advance Directive? Yes  Type of Advance Directive (No Data)  Does patient want to make changes to medical advance directive? No - Patient declined  Would patient like information on creating a medical advance directive? No - Patient declined   Chief Complaint  Patient presents with  . New Admit To SNF  HPI: Patient is a 26 y.o. male seen today for admission to [facility] SNF PT/OT following hospitalization 7/13-7/31/18 with sepsis due to methicillin resistant staph aureus. The MRSA was in the context of documented intravenous drug use and polysubstance abuse including cocaine and heroin. 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 Soft tissue abscesses also present in the inguinal area, status post incision and drainage. The patient has type 2 diabetes which with acute diabetic ketoacidosis. Additionally he was found to have acute pyelonephritis and a normocytic anemia. The patient initially had been seen at [place] diagnosed with frank sepsis. White count was 28,000 with left lower lobe pneumonia and possible left upper lobe pneumonia as well. His glucoses were in the 200s but he had greater than 80 ketones in the urine and anion gap of 20. He was placed on Zosyn and vancomycin and received 1 L of normal saline at the emergency room before transfer to [facility].  After the prolonged hospitalization he was discharged  to [facility] SNF to complete IV daptomycin and oral doxycycline until 8/28 for total course of 6 weeks of IV antibiotics. Planned is prolonged oral suppression using doxycycline as per ID consultant. PICC line had been placed to facilitate the antibiotic administration. Course was complicated by hyponatremia , hypokalemia and hypomagnesemia which were repleted. His insulin-dependent diabetes was uncontrolled with an A1c of 11.9%. Pain control was managed with IV morphine and oxycodone as needed. Bowel program was implemented because of the opioid-induced constipation. He did exhibit tachycardia, TSH was 3.94 on 7/31. Both basal as well as pre-meal sliding scale insulin were to be continued with the metformin. He has a normochromic, normocytic anemia, the most recent hemoglobin/hematocrit were 10.3 and 30.3 on 7/31. Significant in reference to recovery is hypoalbuminemia as low as 2.5  Past Medical History:  Diagnosis Date  . Abscess   . Diabetes mellitus without complication [facility])    Past Surgical History:  Procedure Laterality Date  . DENTAL SURGERY    . TONSILLECTOMY      reports that he has been smoking Cigarettes.  He has a 2.00 pack-year smoking history. He has never used smokeless tobacco. He reports that he drinks alcohol. He reports that he uses drugs, including IV.  01/07/17 patient states he has smoked since 2006 up to pack a day. No alcohol intake. Social History   Social History  . Marital status: Single    Spouse name: N/A  . Number of children: N/A  . Years of education: N/A   Occupational History  . Not on file.   Social History Main Topics  .  Smoking status: Current Every Day Smoker    Packs/day: 1.00    Years: 2.00    Types: Cigarettes  . Smokeless tobacco: Never Used  . Alcohol use Yes  . Drug use: Yes    Types: IV  . Sexual activity: Yes   Other Topics Concern  . Not on file   Social History Narrative  . No narrative on file  Functional Status Survey:Pain in  the low back with change in position due to osteomyelitis hinders range of motion Family history is positive for myocardial infarction and diabetes in his father and diabetes and stroke in his paternal grandmother. Great grandmother also had diabetes. There is no family history of cancer.  There are no preventive care reminders to display for this patient.  Allergies  Allergen Reactions  . Bee Venom Anaphylaxis  . Voltaren [Diclofenac Sodium] Other (See Comments)    "Makes me feel not myself"  . Latex Rash    Outpatient Encounter Prescriptions as of 01/07/2017  Medication Sig  . cyclobenzaprine (FLEXERIL) 5 MG tablet Take 1 tablet (5 mg total) by mouth 3 (three) times daily as needed for muscle spasms.  . daptomycin (CUBICIN) IVPB Inject 860 mg into the vein daily. Indication:  Disseminated MRSA infection  Last Day of Therapy:  02/03/17 Labs - Once weekly:  CBC/D, BMP, and CPK Labs - Every other week:  ESR and CRP  . doxycycline (VIBRA-TABS) 100 MG tablet Take 1 tablet (100 mg total) by mouth every 12 (twelve) hours.  Marland Kitchen HYDROcodone-acetaminophen (NORCO/VICODIN) 5-325 MG tablet Take 1 tablet by mouth every 4 (four) hours as needed. for pain  . insulin aspart (NOVOLOG) 100 UNIT/ML injection Before each meal 3 times a day, 140-199 - 2 units, 200-250 - 4 units, 251-299 - 6 units,  300-349 - 8 units,  350 or above 10 units. Insulin PEN if approved, provide syringes and needles if needed.  . Insulin Glargine (TOUJEO MAX SOLOSTAR) 300 UNIT/ML SOPN Inject 50 Units into the skin daily.  . metFORMIN (GLUCOPHAGE) 500 MG tablet Take 500 mg by mouth 2 (two) times daily with a meal.  . polyethylene glycol (MIRALAX / GLYCOLAX) packet Take 17 g by mouth daily.  Marland Kitchen senna-docusate (SENOKOT-S) 8.6-50 MG tablet Take 2 tablets by mouth at bedtime.   No facility-administered encounter medications on file as of 01/07/2017.    Review of Systems  Constitutional: Negative.   HENT: Negative.   Eyes: Negative.     Respiratory: Negative.   Cardiovascular: Negative.   Gastrointestinal: Negative.   Genitourinary: Negative.   Musculoskeletal: Positive for back pain.  Skin: Negative.   Neurological: Negative.   Psychiatric/Behavioral: Negative.    his only voiced complaint is pain in the back related to the abscess/osteomyelitis. He states he's been on insulin for approximately 2 years at 40 units daily. He states his fasting glucoses when checked are in the 120s. He denies any hypoglycemia or complications with the diabetes. Review of Systems  Constitutional: Negative.   HENT: Negative.   Eyes: Negative.   Respiratory: Negative.   Cardiovascular: Negative.   Gastrointestinal: Negative.   Genitourinary: Negative.   Musculoskeletal: Positive for back pain.  Skin: Negative.   Neurological: Negative.   Endo/Heme/Allergies: Negative.   Psychiatric/Behavioral: Negative.    Vitals:   01/07/17 1000  BP: 122/77  Pulse: (!) 116  Resp: 20  Temp: (!) 97.3 F (36.3 C)  TempSrc: Oral   There is no height or weight on file to calculate BMI. Physical Exam  Physical exam:  Pertinent or positive findings:Obese. Initially he was sleeping deeply with hypopnea without frank apnea or snoring. Upon awakening he was slightly lethargic initially. He has a beard and mustache. The most striking finding is myriad tattoos over the thorax and extremities. Exhibits an S4 with tachycardia (12/20/16 ECHO reviewed: no vegetations; hyperdynamic EF. TSH normal & only mild anemia as noted). Skin slightly damp over the posterior thorax.  General appearance:Adequately nourished; no acute distress , increased work of breathing is present.   Lymphatic: No lymphadenopathy about the head, neck, axilla . Eyes: No conjunctival inflammation or lid edema is present. There is no scleral icterus. Ears:  External ear exam shows no significant lesions or deformities.   Nose:  External nasal examination shows no deformity or inflammation.  Nasal mucosa are pink and moist without lesions ,exudates Oral exam: lips and gums are healthy appearing.There is no oropharyngeal erythema or exudate . Neck:  No thyromegaly, masses, tenderness noted.    Heart:  No murmur, click, rub .  Lungs:Chest clear to auscultation without wheezes, rhonchi,rales , rubs. Abdomen:Bowel sounds are normal. Abdomen is soft and nontender with no organomegaly, hernias,masses. GU: deferred  Extremities:  No cyanosis, clubbing,edema  Neurologic exam : Balance,Rhomberg,finger to nose testing could not be completed due to clinical state No significant lesions or rash.  Labs reviewed: Basic Metabolic Panel:  Recent Labs  12/29/16 0456 12/31/16 0432 01/01/17 0347 01/06/17 0449  NA 135 135 133* 135  K 4.1 4.6 4.3 3.7  CL 96* 98* 96* 99*  CO2 30 26 25 28   GLUCOSE 91 100* 92 143*  BUN 9 10 13 10   CREATININE 0.52* 0.53* 0.56* 0.48*  CALCIUM 9.3 9.4 9.7 9.0  MG 1.8 1.8  --  1.7   Liver Function Tests:  Recent Labs  12/20/16 0909 12/29/16 0456 12/31/16 0432  AST  --  23 19  ALT  --  53 34  ALKPHOS  --  101 89  BILITOT  --  0.5 0.2*  PROT 5.9* 7.7 7.7  ALBUMIN  --  2.5* 2.8*   No results for input(s): LIPASE, AMYLASE in the last 8760 hours. No results for input(s): AMMONIA in the last 8760 hours. CBC:  Recent Labs  12/29/16 0456 12/31/16 0432 01/01/17 0347 01/06/17 0449  WBC 13.1* 12.6* 10.6* 8.5  NEUTROABS 9.1* 8.7*  --   --   HGB 11.9* 11.6* 11.1* 10.3*  HCT 36.7* 35.5* 36.1* 32.3*  MCV 84.0 84.1 84.0 82.2  PLT 789* 722* 665* 474*   Cardiac Enzymes:  Recent Labs  12/22/16 1212 12/29/16 0456 01/05/17 0432  CKTOTAL 20* 24* 31*   BNP: Invalid input(s): POCBNP Lab Results  Component Value Date   HGBA1C 11.9 (H) 12/26/2016   Lab Results  Component Value Date   TSH 3.941 01/06/2017   Lab Results  Component Value Date   VITAMINB12 397 12/21/2016   Lab Results  Component Value Date   FOLATE 9.8 12/21/2016   Lab  Results  Component Value Date   IRON 17 (L) 12/20/2016   TIBC 132 (L) 12/20/2016   FERRITIN 1,249 (H) 12/20/2016    Imaging and Procedures obtained prior to SNF admission: No results found.  Assessment/Plan See summary under each active problem in the Problem List with associated updated therapeutic plan  Family/ staff Communication:   Labs/tests ordered:none Protein supplement & Nicoderm patch ordered

## 2017-01-07 NOTE — Assessment & Plan Note (Signed)
Complete 6 weeks of IV daptomycin via PICC line with continuation of prolonged oral suppression with doxycycline as per ID

## 2017-01-07 NOTE — Assessment & Plan Note (Signed)
NicoDerm patch 21 mg daily for 6 weeks then decrease to 14 mg patch for 6 weeks

## 2017-01-09 ENCOUNTER — Encounter (HOSPITAL_COMMUNITY)
Admission: RE | Admit: 2017-01-09 | Discharge: 2017-01-09 | Disposition: A | Discharge status: Home / Self Care | Payer: Medicaid - Out of State | Source: Skilled Nursing Facility | Attending: Internal Medicine | Admitting: Internal Medicine

## 2017-01-09 DIAGNOSIS — A4102 Sepsis due to Methicillin resistant Staphylococcus aureus: Secondary | ICD-10-CM | POA: Insufficient documentation

## 2017-01-09 DIAGNOSIS — R000 Tachycardia, unspecified: Secondary | ICD-10-CM | POA: Insufficient documentation

## 2017-01-09 LAB — CBC WITH DIFFERENTIAL/PLATELET
Basophils Absolute: 0 10*3/uL (ref 0.0–0.1)
Basophils Relative: 0 %
EOS PCT: 1 %
Eosinophils Absolute: 0.1 10*3/uL (ref 0.0–0.7)
HCT: 33.8 % — ABNORMAL LOW (ref 39.0–52.0)
Hemoglobin: 11 g/dL — ABNORMAL LOW (ref 13.0–17.0)
LYMPHS ABS: 2.9 10*3/uL (ref 0.7–4.0)
LYMPHS PCT: 29 %
MCH: 27 pg (ref 26.0–34.0)
MCHC: 32.5 g/dL (ref 30.0–36.0)
MCV: 83 fL (ref 78.0–100.0)
MONO ABS: 0.8 10*3/uL (ref 0.1–1.0)
Monocytes Relative: 8 %
Neutro Abs: 6.3 10*3/uL (ref 1.7–7.7)
Neutrophils Relative %: 62 %
Platelets: 470 10*3/uL — ABNORMAL HIGH (ref 150–400)
RBC: 4.07 MIL/uL — AB (ref 4.22–5.81)
RDW: 14.2 % (ref 11.5–15.5)
WBC: 10.1 10*3/uL (ref 4.0–10.5)

## 2017-01-09 LAB — BASIC METABOLIC PANEL
ANION GAP: 12 (ref 5–15)
BUN: 12 mg/dL (ref 6–20)
CO2: 26 mmol/L (ref 22–32)
Calcium: 9.6 mg/dL (ref 8.9–10.3)
Chloride: 96 mmol/L — ABNORMAL LOW (ref 101–111)
Creatinine, Ser: 0.56 mg/dL — ABNORMAL LOW (ref 0.61–1.24)
GFR calc Af Amer: >60 mL/min (ref >60)
GLUCOSE: 247 mg/dL — AB (ref 65–99)
POTASSIUM: 3.9 mmol/L (ref 3.5–5.1)
Sodium: 134 mmol/L — ABNORMAL LOW (ref 135–145)

## 2017-01-09 NOTE — Assessment & Plan Note (Signed)
TSH WNL & only mild anemia Cardiology consultation if progressive or symptomatic

## 2017-01-12 ENCOUNTER — Encounter (HOSPITAL_COMMUNITY)
Admission: RE | Admit: 2017-01-12 | Discharge: 2017-01-12 | Disposition: A | Discharge status: Home / Self Care | Payer: Medicaid - Out of State | Source: Skilled Nursing Facility | Attending: *Deleted | Admitting: *Deleted

## 2017-01-12 LAB — CBC WITH DIFFERENTIAL/PLATELET
BASOS PCT: 0 %
Basophils Absolute: 0 10*3/uL (ref 0.0–0.1)
Eosinophils Absolute: 0.1 10*3/uL (ref 0.0–0.7)
Eosinophils Relative: 1 %
HEMATOCRIT: 35.2 % — AB (ref 39.0–52.0)
Hemoglobin: 11.5 g/dL — ABNORMAL LOW (ref 13.0–17.0)
LYMPHS PCT: 29 %
Lymphs Abs: 3 10*3/uL (ref 0.7–4.0)
MCH: 27 pg (ref 26.0–34.0)
MCHC: 32.7 g/dL (ref 30.0–36.0)
MCV: 82.6 fL (ref 78.0–100.0)
Monocytes Absolute: 0.8 10*3/uL (ref 0.1–1.0)
Monocytes Relative: 7 %
NEUTROS ABS: 6.6 10*3/uL (ref 1.7–7.7)
NEUTROS PCT: 63 %
Platelets: 454 10*3/uL — ABNORMAL HIGH (ref 150–400)
RBC: 4.26 MIL/uL (ref 4.22–5.81)
RDW: 14.3 % (ref 11.5–15.5)
WBC: 10.5 10*3/uL (ref 4.0–10.5)

## 2017-01-12 LAB — BASIC METABOLIC PANEL
Anion gap: 10 (ref 5–15)
BUN: 10 mg/dL (ref 6–20)
CALCIUM: 9.6 mg/dL (ref 8.9–10.3)
CO2: 28 mmol/L (ref 22–32)
Chloride: 98 mmol/L — ABNORMAL LOW (ref 101–111)
Creatinine, Ser: 0.6 mg/dL — ABNORMAL LOW (ref 0.61–1.24)
Glucose, Bld: 127 mg/dL — ABNORMAL HIGH (ref 65–99)
POTASSIUM: 4.1 mmol/L (ref 3.5–5.1)
Sodium: 136 mmol/L (ref 135–145)

## 2017-01-14 ENCOUNTER — Other Ambulatory Visit: Payer: Self-pay | Admitting: Cardiothoracic Surgery

## 2017-01-14 DIAGNOSIS — J90 Pleural effusion, not elsewhere classified: Secondary | ICD-10-CM

## 2017-01-15 ENCOUNTER — Inpatient Hospital Stay
Admit: 2017-01-15 | Discharge: 2017-01-15 | Disposition: A | Discharge status: Home / Self Care | Payer: Medicaid - Out of State | Attending: Cardiothoracic Surgery | Admitting: Cardiothoracic Surgery

## 2017-01-15 ENCOUNTER — Ambulatory Visit (INDEPENDENT_AMBULATORY_CARE_PROVIDER_SITE_OTHER): Payer: Medicaid - Out of State | Admitting: Cardiothoracic Surgery

## 2017-01-15 ENCOUNTER — Encounter: Payer: Self-pay | Admitting: Cardiothoracic Surgery

## 2017-01-15 ENCOUNTER — Other Ambulatory Visit: Payer: Self-pay | Admitting: Cardiothoracic Surgery

## 2017-01-15 VITALS — BP 124/74 | HR 99 | Temp 98.1°F | Resp 16 | Ht 71.0 in | Wt 237.2 lb

## 2017-01-15 DIAGNOSIS — J869 Pyothorax without fistula: Secondary | ICD-10-CM

## 2017-01-15 DIAGNOSIS — J90 Pleural effusion, not elsewhere classified: Secondary | ICD-10-CM

## 2017-01-15 NOTE — Progress Notes (Signed)
[address]       [place] [address]             [phone]      Keith Riggs [facility] Medical Record #[id number] Date of Birth: 05-08-1991  Referring: Delila Spence, MD Primary Care: System, Pcp Not In  Chief Complaint:    FOLLOW UP after chest tubes placement   History of Present Illness:     Patient returns to office today with follow-up x-ray of the chest for evaluation of his previous empyema treated with pigtail drainage 2. 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 Overall he notes that he feels much better, denies fever chills, has had no shortness of breath or cough. [facility]     Past Medical History:  Diagnosis Date  . Abscess   . Diabetes mellitus without complication (HCC)      History  Smoking Status  . Current Every Day Smoker  . Packs/day: 1.00  . Years: 2.00  . Types: Cigarettes  Smokeless Tobacco  . Never Used    Comment: Began 2006    History  Alcohol Use  . Yes     Allergies  Allergen Reactions  . Bee Venom Anaphylaxis  . Voltaren [Diclofenac Sodium] Other (See Comments)    "Makes me feel not myself"  . Latex Rash    Current Outpatient Prescriptions  Medication Sig Dispense Refill  . HYDROcodone-acetaminophen (NORCO/VICODIN) 5-325 MG tablet Take 1 tablet by mouth every 4 (four) hours as needed. for pain 180 tablet 0   No current facility-administered medications for this visit.        Physical Exam: BP 124/74 (BP Location: Left Arm, Patient Position: Sitting, Cuff Size: Normal)   Pulse 99   Temp 98.1 F (36.7 C) (Oral)   Resp 16   Ht 5\' 11"  (1.803 m)   Wt 237 lb 3.2 oz (107.6 kg)   SpO2 97% Comment: ON RA  BMI 33.08 kg/m   General appearance: alert and cooperative Neurologic: intact Heart: regular rate and rhythm, S1, S2 normal, no murmur, click, rub or gallop Lungs: diminished breath sounds  bibasilar Abdomen: soft, non-tender; bowel sounds normal; no masses,  no organomegaly Extremities: extremities normal, atraumatic, no cyanosis or edema and Homans sign is negative, no sign of DVT Wound: Left chest tube sites are well-healed PICC line in the right arm  Diagnostic Studies & Laboratory data:     Recent Radiology Findings:   Dg Chest 2 View  Result Date: 01/15/2017 CLINICAL DATA:  Bilateral pleural effusions. EXAM: CHEST  2 VIEW COMPARISON:  01/01/2017 and 12/24/2016 FINDINGS: Right-sided PICC line has tip within the SVC. Lungs are adequately inflated as the right lung is clear. There is persistent opacification over the lateral left lung unchanged to slightly worse compatible with known empyema/pleural fluid collection. Stable nodule opacity over the left apex. Remainder the exam is unchanged. IMPRESSION: Opacification over the lateral left forearm unchanged to slightly worse compatible with known empyema/ pleural fluid collection. Stable nodule opacity over the left apex. Recommend attention on follow-up. Right-sided PICC line with tip in the SVC. Electronically Signed   By: Elberta Fortis M.D.   On: 01/15/2017 11:30   X-ray today is much improved compared to the x-rays that he had during his hospitalization.  I have independently reviewed the above radiology studies  and reviewed the findings with the patient.  Recent Lab Findings: Lab Results  Component Value Date   WBC 10.5 01/12/2017   HGB 11.5 (L) 01/12/2017   HCT 35.2 (L) 01/12/2017   PLT 454 (H) 01/12/2017   GLUCOSE 127 (H) 01/12/2017   ALT 34 12/31/2016   AST 19 12/31/2016   NA 136 01/12/2017   K 4.1 01/12/2017   CL 98 (L) 01/12/2017   CREATININE 0.60 (L) 01/12/2017   BUN 10 01/12/2017   CO2 28 01/12/2017   TSH 3.941 01/06/2017   INR 1.03 12/29/2016   HGBA1C 11.9 (H) 12/26/2016      Assessment / Plan:      Improving left pleural effusions/ empyema with initial pigtail drainage 2 and subsequent  follow-up antibiotic therapy Plan to see the patient back in 4 weeks with a follow-up chest x-ray He has a infectious disease visit coming up in the next several days.      Delight OvensEdward B Tolulope Pinkett MD      [address] [place] [id number] Office [phone]   Beeper [phone]  01/15/2017 11:59 AM

## 2017-01-19 ENCOUNTER — Encounter (HOSPITAL_COMMUNITY)
Admission: RE | Admit: 2017-01-19 | Discharge: 2017-01-19 | Disposition: A | Discharge status: Home / Self Care | Payer: Medicaid - Out of State | Source: Skilled Nursing Facility | Attending: *Deleted | Admitting: *Deleted

## 2017-01-19 LAB — BASIC METABOLIC PANEL
Anion gap: 10 (ref 5–15)
BUN: 9 mg/dL (ref 6–20)
CALCIUM: 9.1 mg/dL (ref 8.9–10.3)
CO2: 27 mmol/L (ref 22–32)
CREATININE: 0.49 mg/dL — AB (ref 0.61–1.24)
Chloride: 100 mmol/L — ABNORMAL LOW (ref 101–111)
GFR calc Af Amer: >60 mL/min (ref >60)
GLUCOSE: 103 mg/dL — AB (ref 65–99)
Potassium: 3.6 mmol/L (ref 3.5–5.1)
Sodium: 137 mmol/L (ref 135–145)

## 2017-01-19 LAB — CBC WITH DIFFERENTIAL/PLATELET
Basophils Absolute: 0 10*3/uL (ref 0.0–0.1)
Basophils Relative: 0 %
EOS ABS: 0.2 10*3/uL (ref 0.0–0.7)
EOS PCT: 4 %
HCT: 34.1 % — ABNORMAL LOW (ref 39.0–52.0)
Hemoglobin: 11.1 g/dL — ABNORMAL LOW (ref 13.0–17.0)
LYMPHS ABS: 2.4 10*3/uL (ref 0.7–4.0)
Lymphocytes Relative: 39 %
MCH: 26.8 pg (ref 26.0–34.0)
MCHC: 32.6 g/dL (ref 30.0–36.0)
MCV: 82.4 fL (ref 78.0–100.0)
MONO ABS: 0.6 10*3/uL (ref 0.1–1.0)
Monocytes Relative: 9 %
Neutro Abs: 3 10*3/uL (ref 1.7–7.7)
Neutrophils Relative %: 48 %
PLATELETS: 339 10*3/uL (ref 150–400)
RBC: 4.14 MIL/uL — ABNORMAL LOW (ref 4.22–5.81)
RDW: 15 % (ref 11.5–15.5)
WBC: 6.2 10*3/uL (ref 4.0–10.5)

## 2017-01-21 ENCOUNTER — Other Ambulatory Visit: Payer: Self-pay

## 2017-01-21 MED ORDER — HYDROCODONE-ACETAMINOPHEN 5-325 MG PO TABS
1.0000 | ORAL_TABLET | ORAL | 0 refills | Status: DC | PRN
Start: 2017-01-21 — End: 2020-10-14

## 2017-01-21 NOTE — Telephone Encounter (Signed)
RX Fax for [facility]@ [phone]  

## 2017-01-28 ENCOUNTER — Inpatient Hospital Stay
Admission: RE | Admit: 2017-01-28 | Discharge: 2017-02-03 | Disposition: A | Discharge status: Home / Self Care | Payer: Medicaid - Out of State | Source: Ambulatory Visit | Attending: Internal Medicine | Admitting: Internal Medicine

## 2017-01-28 ENCOUNTER — Encounter (HOSPITAL_COMMUNITY): Payer: Self-pay | Admitting: Emergency Medicine

## 2017-01-28 ENCOUNTER — Emergency Department (HOSPITAL_COMMUNITY)
Admission: EM | Admit: 2017-01-28 | Discharge: 2017-01-28 | Disposition: A | Discharge status: Home / Self Care | Payer: Medicaid - Out of State | Attending: Emergency Medicine | Admitting: Emergency Medicine

## 2017-01-28 DIAGNOSIS — F17210 Nicotine dependence, cigarettes, uncomplicated: Secondary | ICD-10-CM | POA: Insufficient documentation

## 2017-01-28 DIAGNOSIS — E1169 Type 2 diabetes mellitus with other specified complication: Secondary | ICD-10-CM | POA: Diagnosis not present

## 2017-01-28 DIAGNOSIS — T82838A Hemorrhage of vascular prosthetic devices, implants and grafts, initial encounter: Secondary | ICD-10-CM | POA: Diagnosis not present

## 2017-01-28 DIAGNOSIS — Y658 Other specified misadventures during surgical and medical care: Secondary | ICD-10-CM | POA: Insufficient documentation

## 2017-01-28 DIAGNOSIS — Z91040 Latex allergy status: Secondary | ICD-10-CM | POA: Diagnosis not present

## 2017-01-28 DIAGNOSIS — Z794 Long term (current) use of insulin: Secondary | ICD-10-CM | POA: Diagnosis not present

## 2017-01-28 DIAGNOSIS — Z79899 Other long term (current) drug therapy: Secondary | ICD-10-CM | POA: Insufficient documentation

## 2017-01-28 DIAGNOSIS — E119 Type 2 diabetes mellitus without complications: Secondary | ICD-10-CM | POA: Diagnosis not present

## 2017-01-28 NOTE — ED Notes (Signed)
Pt has dressing to site from 01/17/17, re-enforced by tape and open to air at edges. Dried blood noted to dressing.

## 2017-01-28 NOTE — ED Provider Notes (Signed)
[facility] DEPT Provider Note   CSN: [id number] Arrival date & time: 01/28/17  1454     History   Chief Complaint Chief Complaint  Patient presents with  . Vascular Access Problem    HPI Keith Riggs is a 26 y.o. male.  Pt presents to the ED today with bleeding from PICC line.  Pt was admitted for MRSA sepsis from 7/13-31.  He has a hx of IVDA and developed osteomyelitis and epidural abscess of the thoracic spine along with DKA.  The pt was put on IV daptomycin and oral doxycycline until August 28.  The nurse who gives him his injections was concerned that the picc line was bleeding.  Pt said he feels good and the picc line looks the same as it's looked for weeks.         Past Medical History:  Diagnosis Date  . Abscess   . Diabetes mellitus without complication [facility])     Patient Active Problem List   Diagnosis Date Noted  . Tachycardia 01/09/2017  . At risk for adverse drug reaction 01/07/2017  . Hypoalbuminemia 01/07/2017  . Pain management   . Sepsis due to methicillin resistant Staphylococcus aureus (MRSA) ([place]) 12/22/2016  . Insulin-dependent diabetes mellitus with ketoacidosis ([place]) 12/20/2016  . IVDU (intravenous drug user) 12/20/2016  . DKA (diabetic ketoacidoses) ([place]) 12/20/2016  . Polysubstance abuse 12/20/2016  . Pleural effusion, bilateral 12/20/2016  . Osteomyelitis of thoracic spine ([place]) 12/20/2016  . 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 Soft tissue abscess of inguinal region 12/20/2016  . Cigarette smoker 12/20/2016  . Normocytic anemia 12/20/2016  . Acute pyelonephritis 12/20/2016    Past Surgical History:  Procedure Laterality Date  . DENTAL SURGERY    . TONSILLECTOMY         Home Medications    Prior to Admission medications   Medication Sig Start Date End Date Taking? Authorizing Provider  cyclobenzaprine (FLEXERIL) 5 MG tablet Take 1 tablet (5 mg total) by mouth 3 (three) times daily as needed for muscle  spasms. 01/06/17  Yes Florencia Reasons, MD  daptomycin (CUBICIN) IVPB Inject 860 mg into the vein daily. Indication:  Disseminated MRSA infection  Last Day of Therapy:  02/03/17 Labs - Once weekly:  CBC/D, BMP, and CPK Labs - Every other week:  ESR and CRP Patient taking differently: Inject 860 mg into the vein daily. Last Day of d MRSA infection Therapy:  02/03/17 01/06/17  Yes Florencia Reasons, MD  doxycycline (VIBRA-TABS) 100 MG tablet Take 1 tablet (100 mg total) by mouth every 12 (twelve) hours. 01/06/17  Yes Florencia Reasons, MD  HYDROcodone-acetaminophen (NORCO/VICODIN) 5-325 MG tablet Take 1 tablet by mouth every 4 (four) hours as needed. for pain 01/21/17  Yes Lassen, Arlo C, PA-C  insulin aspart (NOVOLOG) 100 UNIT/ML injection Before each meal 3 times a day, 140-199 - 2 units, 200-250 - 4 units, 251-299 - 6 units,  300-349 - 8 units,  350 or above 10 units. Insulin PEN if approved, provide syringes and needles if needed. Patient taking differently: Inject 2-10 Units into the skin 3 (three) times daily. Before each meal 3 times a day, 140-199 - 2 units, 200-250 - 4 units, 251-299 - 6 units,  300-349 - 8 units,  350 or above 10 units. Insulin PEN if approved, provide syringes and needles if needed. 01/06/17  Yes Florencia Reasons, MD  Insulin Glargine (TOUJEO MAX SOLOSTAR) 300 UNIT/ML SOPN Inject 50 Units into the skin daily. 01/06/17  Yes Florencia Reasons, MD  metFORMIN (GLUCOPHAGE) 500 MG tablet Take 500 mg by mouth 2 (two) times daily with a meal.   Yes [provider]  polyethylene glycol (MIRALAX / GLYCOLAX) packet Take 17 g by mouth daily. 01/07/17  Yes Florencia Reasons, MD  senna-docusate (SENOKOT-S) 8.6-50 MG tablet Take 2 tablets by mouth at bedtime. 01/06/17  Yes Florencia Reasons, MD    Family History Family History  Problem Relation Age of Onset  . Diabetes Father   . Heart disease Father   . Diabetes Maternal Grandmother   . Miscarriages / Stillbirths Maternal Grandmother   . Cancer Neg Hx     Social History Social History    Substance Use Topics  . Smoking status: Current Every Day Smoker    Packs/day: 0.50    Years: 2.00    Types: Cigarettes  . Smokeless tobacco: Current User    Types: Snuff     Comment: Began 2006  . Alcohol use No     Allergies   Bee venom; Voltaren [diclofenac sodium]; and Latex   Review of Systems Review of Systems  Skin:       Bleeding picc line     Physical Exam Updated Vital Signs BP 131/83   Pulse (!) 109   Temp 98.3 F (36.8 C)   Resp 18   Ht _0  (1.803 m)   Wt 112.5 kg (248 lb)   SpO2 97%   BMI 34.59 kg/m   Physical Exam  Constitutional: He is oriented to person, place, and time. He appears well-developed and well-nourished.  HENT:  Head: Normocephalic and atraumatic.  Right Ear: External ear normal.  Left Ear: External ear normal.  Nose: Nose normal.  Mouth/Throat: Oropharynx is clear and moist.  Eyes: Pupils are equal, round, and reactive to light. Conjunctivae and EOM are normal.  Neck: Normal range of motion. Neck supple.  Cardiovascular: Normal rate, regular rhythm, normal heart sounds and intact distal pulses.   Pulmonary/Chest: Effort normal and breath sounds normal.  Abdominal: Soft. Bowel sounds are normal.  Musculoskeletal: Normal range of motion.  Neurological: He is alert and oriented to person, place, and time.  Skin: Skin is warm.     Nursing note and vitals reviewed.    ED Treatments / Results  Labs (all labs ordered are listed, but only abnormal results are displayed) Labs Reviewed - No data to display  EKG  EKG Interpretation None       Radiology No results found.  Procedures Procedures (including critical care time)  Medications Ordered in ED Medications - No data to display   Initial Impression / Assessment and Plan / ED Course  I have reviewed the triage vital signs and the nursing notes.  Pertinent labs & imaging results that were available during my care of the patient were reviewed by me and  considered in my medical decision making (see chart for details).    PICC line flushed and drained well.  Dressing changed.  Pt is stable for d/c.  Final Clinical Impressions(s) / ED Diagnoses   Final diagnoses:  Bleeding from PICC line, initial encounter [facility])    New Prescriptions New Prescriptions   No medications on file     Isla Pence, MD 01/28/17 1521

## 2017-01-28 NOTE — ED Notes (Signed)
MD Haviland in to check new dressing. PICC line flushes without difficulty or pain at this time.

## 2017-01-28 NOTE — ED Notes (Signed)
Report given to [facility]. nad

## 2017-01-28 NOTE — ED Triage Notes (Signed)
Pt comes in for R PICC line bleeding x 2 days and SNF stating line looks like it is pulled out. Denies fever, chills, or purulent drainage from site. Is continuing to get IV antibx for abscess.

## 2017-01-29 ENCOUNTER — Non-Acute Institutional Stay (SKILLED_NURSING_FACILITY): Payer: Self-pay | Admitting: Internal Medicine

## 2017-01-29 ENCOUNTER — Encounter (HOSPITAL_COMMUNITY)
Admission: RE | Admit: 2017-01-29 | Discharge: 2017-01-29 | Disposition: A | Discharge status: Home / Self Care | Payer: Medicaid - Out of State | Source: Skilled Nursing Facility | Attending: Internal Medicine | Admitting: Internal Medicine

## 2017-01-29 ENCOUNTER — Encounter: Payer: Self-pay | Admitting: Internal Medicine

## 2017-01-29 DIAGNOSIS — A4102 Sepsis due to Methicillin resistant Staphylococcus aureus: Secondary | ICD-10-CM | POA: Insufficient documentation

## 2017-01-29 DIAGNOSIS — M4624 Osteomyelitis of vertebra, thoracic region: Secondary | ICD-10-CM

## 2017-01-29 DIAGNOSIS — Z978 Presence of other specified devices: Secondary | ICD-10-CM | POA: Insufficient documentation

## 2017-01-29 DIAGNOSIS — R635 Abnormal weight gain: Secondary | ICD-10-CM

## 2017-01-29 DIAGNOSIS — D649 Anemia, unspecified: Secondary | ICD-10-CM | POA: Insufficient documentation

## 2017-01-29 DIAGNOSIS — E1165 Type 2 diabetes mellitus with hyperglycemia: Secondary | ICD-10-CM | POA: Insufficient documentation

## 2017-01-29 LAB — CK: CK TOTAL: 106 U/L (ref 49–397)

## 2017-01-29 NOTE — Progress Notes (Signed)
Location:    [facility] Room Number: 139/W Place of Service:  SNF (31) Provider:  [facility]  System, Pcp Not In  Patient Care Team: System, Pcp Not In as PCP - General  Extended Emergency Contact Information Primary Emergency Contact: Bramer,Kelly Address: [address] [place] [address] [place] Phone: [phone] Relation: Father  Code Status:  Full Code Goals of care: Advanced Directive information Advanced Directives 01/29/2017  Does Patient Have a Medical Advance Directive? Yes  Type of Advance Directive (No Data)  Does patient want to make changes to medical advance directive? No - Patient declined  Would patient like information on creating a medical advance directive? No - Patient declined     Chief Complaint  Patient presents with  . Acute Visit    Weight Gain    HPI:  Pt is a 26 y.o. male seen today for an acute visit for Follow-up of weight gain.  Apparently he's gained about 15 pounds since the beginning of the month.  He is here to receive IV antibiotics with a history of sepsis because of MRSA-this is in the context of documented IV drug use and polysubstance abuse.  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  He is completing a course of daptomycin as well as doxycycline which will end on August 28.  Apparently nursing staff has noted these gradually gained about 15 pounds since beginning of the month-per nursing he is eating very well he actually has a leave of absence quite often and is out eating as well as apparently eats quite well there as well.  He does not complain of any shortness of breath or chest pain-he does not have any increased edema.  He did have a cardiac echo done in July 2018 showed an ejection fraction of 70-75 percent  without any vegetation.     Past Medical History:  Diagnosis Date  . Abscess   . Diabetes mellitus without complication [facility])    Past Surgical History:  Procedure Laterality Date  . DENTAL SURGERY    . TONSILLECTOMY      Allergies  Allergen Reactions  . Bee Venom Anaphylaxis  . Voltaren [Diclofenac Sodium] Other (See Comments)    "Makes me feel not myself"  . Latex Rash    Outpatient Encounter Prescriptions as of 01/29/2017  Medication Sig  . cyclobenzaprine (FLEXERIL) 5 MG tablet Take 1 tablet (5 mg total) by mouth 3 (three) times daily as needed for muscle spasms.  . daptomycin (CUBICIN) IVPB Inject 860 mg into the vein daily. Indication:  Disseminated MRSA infection  Last Day of Therapy:  02/03/17 Labs - Once weekly:  CBC/D, BMP, and CPK Labs - Every other week:  ESR and CRP  . doxycycline (VIBRA-TABS) 100 MG tablet Take 1 tablet (100 mg total) by mouth every 12 (twelve) hours.  Marland Kitchen HYDROcodone-acetaminophen (NORCO/VICODIN) 5-325 MG tablet Take 1 tablet by mouth every 4 (four) hours as needed. for pain  . insulin aspart (NOVOLOG) 100 UNIT/ML injection Before each meal 3 times a day, 140-199 - 2 units, 200-250 - 4 units, 251-299 - 6 units,  300-349 - 8 units,  350 or above 10 units. Insulin PEN if approved, provide syringes and needles if needed.  . Insulin Glargine (TOUJEO MAX SOLOSTAR) 300 UNIT/ML  SOPN Inject 50 Units into the skin daily.  . metFORMIN (GLUCOPHAGE) 500 MG tablet Take 500 mg by mouth 2 (two) times daily with a meal.  . polyethylene glycol (MIRALAX / GLYCOLAX) packet Take 17 g by mouth daily.  Marland Kitchen senna-docusate (SENOKOT-S) 8.6-50 MG tablet Take 2 tablets by mouth at bedtime.   No facility-administered encounter medications on file as of 01/29/2017.     Review of Systems   General is not complaining of any fever or chills.  Skin does not complain of rashes or itching-- did have some bleeding around his PICC line on his right upper arm I--did go to the ER  with It was flushed apparently remained patent   Eyes does not complain of visual changes.  Ear nose mouth and throat does not complain of any difficulty swallowing or sore throat.  Respirate denies shortness breath or cough.  Cardiac denies chest pain or edema.  GU does not complaining of any dysuria.  Muscle skeletal does not complain of joint pain he is ambulatory again has frequently valve absences.  Neurologic does not complain of dizziness headache syncope.  Psych does not complain of any anxiety or depression nursing staff does not report any behavioral issues that I am aware of   There is no immunization history on file for this patient. Pertinent  Health Maintenance Due  Topic Date Due  . FOOT EXAM  02/26/2017 (Originally 12/13/1998)  . OPHTHALMOLOGY EXAM  02/26/2017 (Originally 12/13/1998)  . URINE MICROALBUMIN  02/26/2017 (Originally 12/13/1998)  . INFLUENZA VACCINE  05/09/2017 (Originally 01/07/2017)  . HEMOGLOBIN A1C  06/28/2017   No flowsheet data found. Functional Status Survey:     Temperature is 97.8 pulse 96 respirations 18 blood pressure 138/76.  Weight is 248.8 appears a was 233 first week of this month Physical Exam   \General this is a pleasant young male in no distress lying comfortably in bed.  His skin is warm and dry-he does have numerous tattoos-also PICC line right upper arm which appears to be benign without drainage or evidence of bleeding.  Or any increased erythema.  Eyes pupils appear reactive light sclera Are clear visual acuity appears intact.  Chest is clear to auscultation there is no labored breathing.  Heart is regular rate and rhythm without murmur gallop or rub he does not have significant lower extremity edema.  Abdomen is soft nontender positive bowel sounds.  Musculoskeletal moves all extremities 4 ambulates without difficulty.  Neurologic is grossly intact speech is clear no lateralizing findings.  Psych he is alert and  oriented pleasant and appropriate  Labs reviewed:  Recent Labs  12/29/16 0456 12/31/16 0432  01/06/17 0449 01/09/17 0900 01/12/17 0955 01/19/17 0700  NA 135 135  < > 135 134* 136 137  K 4.1 4.6  < > 3.7 3.9 4.1 3.6  CL 96* 98*  < > 99* 96* 98* 100*  CO2 30 26  < > 28 26 28 27   GLUCOSE 91 100*  < > 143* 247* 127* 103*  BUN 9 10  < > 10 12 10 9   CREATININE 0.52* 0.53*  < > 0.48* 0.56* 0.60* 0.49*  CALCIUM 9.3 9.4  < > 9.0 9.6 9.6 9.1  MG 1.8 1.8  --  1.7  --   --   --   < > = values in this interval not displayed.  Recent Labs  12/20/16 0909 12/29/16 0456 12/31/16 0432  AST  --  23 19  ALT  --  53 34  ALKPHOS  --  101 89  BILITOT  --  0.5 0.2*  PROT 5.9* 7.7 7.7  ALBUMIN  --  2.5* 2.8*    Recent Labs  01/09/17 0900 01/12/17 0955 01/19/17 0700  WBC 10.1 10.5 6.2  NEUTROABS 6.3 6.6 3.0  HGB 11.0* 11.5* 11.1*  HCT 33.8* 35.2* 34.1*  MCV 83.0 82.6 82.4  PLT 470* 454* 339   Lab Results  Component Value Date   TSH 3.941 01/06/2017   Lab Results  Component Value Date   HGBA1C 11.9 (H) 12/26/2016   No results found for: CHOL, HDL, LDLCALC, LDLDIRECT, TRIG, CHOLHDL  Significant Diagnostic Results in last 30 days:  Dg Chest 2 View  Result Date: 01/15/2017 CLINICAL DATA:  Bilateral pleural effusions. EXAM: CHEST  2 VIEW COMPARISON:  01/01/2017 and 12/24/2016 FINDINGS: Right-sided PICC line has tip within the SVC. Lungs are adequately inflated as the right lung is clear. There is persistent opacification over the lateral left lung unchanged to slightly worse compatible with known empyema/pleural fluid collection. Stable nodule opacity over the left apex. Remainder the exam is unchanged. IMPRESSION: Opacification over the lateral left forearm unchanged to slightly worse compatible with known empyema/ pleural fluid collection. Stable nodule opacity over the left apex. Recommend attention on follow-up. Right-sided PICC line with tip in the SVC. Electronically Signed   By:  Marin Olp M.D.   On: 01/15/2017 11:30   Dg Chest 2 View  Result Date: 01/01/2017 CLINICAL DATA:  Shortness of breath, pleural effusion. EXAM: CHEST  2 VIEW COMPARISON:  Radiographs of December 31, 2016. FINDINGS: The heart size and mediastinal contours are within normal limits. Stable right upper lobe airspace opacity is noted concerning for possible infiltrate. Stable loculated pleural effusion is seen along left chest wall. Stable air component is noted within this effusion. Left basilar atelectasis or infiltrate cannot be excluded. The visualized skeletal structures are unremarkable. IMPRESSION: Stable loculated left pleural effusion. Stable gas component of this effusion is noted laterally. Stable right upper lobe and left lower lobe opacities are noted. Electronically Signed   By: Marijo Conception, M.D.   On: 01/01/2017 07:56   Dg Chest 2 View  Result Date: 12/31/2016 CLINICAL DATA:  Pleural effusion, shortness of Breath EXAM: CHEST  2 VIEW COMPARISON:  12/30/2016 FINDINGS: Small amount of pleural air again noted on the left. Small left pleural effusion, with loculated effusion projecting over the left lateral upper lobe. Nodular airspace disease throughout the left lung and in the right upper lobe. IMPRESSION: Stable loculated left effusion and small loculated left pneumothorax. Stable nodular airspace disease bilaterally. Electronically Signed   By: Rolm Baptise M.D.   On: 12/31/2016 10:24    Assessment/Plan  #1 weight gain-this does not appear to be cardiac related he is eating quite well no evidence of distress here increased edema-again cardiac echo showed normal ejection fraction without diastolic dysfunction back on 12/20/2016.  At this point will continue to monitor I suspect this is most likely appetite related.  Also will update a BNP tomorrow although I suspect this will be quite normal.  #2 history of osteomyelitis with abscess as noted above he is doing well with the daptomycin  and doxycycline will be completing these within the next few days-and I suspect he will be going home after that-he has been afebrile vital signs are stable he appears to be doing well.  [id number]

## 2017-01-30 ENCOUNTER — Encounter (HOSPITAL_COMMUNITY)
Admission: RE | Admit: 2017-01-30 | Discharge: 2017-01-30 | Disposition: A | Discharge status: Home / Self Care | Payer: Medicaid - Out of State | Source: Skilled Nursing Facility | Attending: Internal Medicine | Admitting: Internal Medicine

## 2017-01-30 DIAGNOSIS — A4102 Sepsis due to Methicillin resistant Staphylococcus aureus: Secondary | ICD-10-CM | POA: Insufficient documentation

## 2017-01-30 DIAGNOSIS — E1165 Type 2 diabetes mellitus with hyperglycemia: Secondary | ICD-10-CM | POA: Insufficient documentation

## 2017-01-30 DIAGNOSIS — D649 Anemia, unspecified: Secondary | ICD-10-CM | POA: Insufficient documentation

## 2017-01-30 DIAGNOSIS — Z978 Presence of other specified devices: Secondary | ICD-10-CM | POA: Insufficient documentation

## 2017-01-30 LAB — CBC WITH DIFFERENTIAL/PLATELET
BASOS PCT: 0 %
Basophils Absolute: 0 10*3/uL (ref 0.0–0.1)
EOS ABS: 0.2 10*3/uL (ref 0.0–0.7)
EOS PCT: 3 %
HCT: 37.1 % — ABNORMAL LOW (ref 39.0–52.0)
Hemoglobin: 12.3 g/dL — ABNORMAL LOW (ref 13.0–17.0)
Lymphocytes Relative: 41 %
Lymphs Abs: 3.3 10*3/uL (ref 0.7–4.0)
MCH: 27.1 pg (ref 26.0–34.0)
MCHC: 33.2 g/dL (ref 30.0–36.0)
MCV: 81.7 fL (ref 78.0–100.0)
MONO ABS: 0.4 10*3/uL (ref 0.1–1.0)
MONOS PCT: 5 %
Neutro Abs: 4.1 10*3/uL (ref 1.7–7.7)
Neutrophils Relative %: 51 %
Platelets: 317 10*3/uL (ref 150–400)
RBC: 4.54 MIL/uL (ref 4.22–5.81)
RDW: 15.1 % (ref 11.5–15.5)
WBC: 8 10*3/uL (ref 4.0–10.5)

## 2017-01-30 LAB — COMPREHENSIVE METABOLIC PANEL
ALBUMIN: 4.3 g/dL (ref 3.5–5.0)
ALT: 20 U/L (ref 17–63)
ANION GAP: 9 (ref 5–15)
AST: 19 U/L (ref 15–41)
Alkaline Phosphatase: 57 U/L (ref 38–126)
BILIRUBIN TOTAL: 0.5 mg/dL (ref 0.3–1.2)
BUN: 15 mg/dL (ref 6–20)
CO2: 26 mmol/L (ref 22–32)
Calcium: 9.6 mg/dL (ref 8.9–10.3)
Chloride: 104 mmol/L (ref 101–111)
Creatinine, Ser: 0.65 mg/dL (ref 0.61–1.24)
GFR calc Af Amer: >60 mL/min (ref >60)
GFR calc non Af Amer: >60 mL/min (ref >60)
GLUCOSE: 152 mg/dL — AB (ref 65–99)
POTASSIUM: 4.1 mmol/L (ref 3.5–5.1)
SODIUM: 139 mmol/L (ref 135–145)
TOTAL PROTEIN: 8.3 g/dL — AB (ref 6.5–8.1)

## 2017-01-30 LAB — BRAIN NATRIURETIC PEPTIDE: B Natriuretic Peptide: 12 pg/mL (ref 0.0–100.0)

## 2017-02-02 ENCOUNTER — Ambulatory Visit (INDEPENDENT_AMBULATORY_CARE_PROVIDER_SITE_OTHER): Payer: Medicaid - Out of State | Admitting: Internal Medicine

## 2017-02-02 ENCOUNTER — Encounter: Payer: Self-pay | Admitting: Internal Medicine

## 2017-02-02 VITALS — BP 118/80 | HR 96 | Temp 97.5°F | Ht 71.0 in | Wt 249.0 lb

## 2017-02-02 DIAGNOSIS — J90 Pleural effusion, not elsewhere classified: Secondary | ICD-10-CM

## 2017-02-02 DIAGNOSIS — M4624 Osteomyelitis of vertebra, thoracic region: Secondary | ICD-10-CM

## 2017-02-02 DIAGNOSIS — F1990 Other psychoactive substance use, unspecified, uncomplicated: Secondary | ICD-10-CM | POA: Diagnosis not present

## 2017-02-02 DIAGNOSIS — A4102 Sepsis due to Methicillin resistant Staphylococcus aureus: Secondary | ICD-10-CM

## 2017-02-02 DIAGNOSIS — G061 Intraspinal abscess and granuloma: Secondary | ICD-10-CM | POA: Diagnosis not present

## 2017-02-02 MED ORDER — DOXYCYCLINE HYCLATE 100 MG PO TABS: 100.0000 mg | ORAL_TABLET | Freq: Two times a day (BID) | ORAL | 0 refills | Status: DC

## 2017-02-02 NOTE — Progress Notes (Signed)
   Subjective:    Patient ID: Keith Riggs, male    DOB: 1991/05/22, 26 y.o.   MRN: [id number]  HPI Here for hsfu. 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  It is scheduled to finish tomorrow.  He currently resides at the [facility] due to his substance abuse history and did well with treatment.  No significant pain, walking now, no associated n/v/d.  No cravings or issues with substance abuse now.     Review of Systems  Constitutional: Negative for fatigue and fever.  Gastrointestinal: Negative for diarrhea.  Skin: Negative for rash.  Neurological: Negative for dizziness.       Objective:   Physical Exam  Constitutional: He appears well-developed and well-nourished. No distress.  HENT:  Mouth/Throat: No oropharyngeal exudate.  Eyes: No scleral icterus.  Cardiovascular: Normal rate, regular rhythm and normal heart sounds.   No murmur heard. Pulmonary/Chest: Effort normal and breath sounds normal. No respiratory distress.  Skin: No rash noted.    SH: at [facility] center through tomorrow      Assessment & Plan:

## 2017-02-02 NOTE — Assessment & Plan Note (Signed)
He states he is doing well

## 2017-02-02 NOTE — Assessment & Plan Note (Signed)
Doing well now.  I wll have him continue doxycycline after completion of the daptomycin and recheck his ESR and CRP next visit

## 2017-02-02 NOTE — Assessment & Plan Note (Signed)
-

## 2017-02-02 NOTE — Assessment & Plan Note (Signed)
Improved. To get a repeat CXR at next [facility] visit

## 2017-02-03 ENCOUNTER — Non-Acute Institutional Stay (SKILLED_NURSING_FACILITY): Payer: Self-pay | Admitting: Internal Medicine

## 2017-02-03 ENCOUNTER — Encounter: Payer: Self-pay | Admitting: Internal Medicine

## 2017-02-03 DIAGNOSIS — Z794 Long term (current) use of insulin: Secondary | ICD-10-CM

## 2017-02-03 DIAGNOSIS — E8809 Other disorders of plasma-protein metabolism, not elsewhere classified: Secondary | ICD-10-CM

## 2017-02-03 DIAGNOSIS — E1110 Type 2 diabetes mellitus with ketoacidosis without coma: Secondary | ICD-10-CM

## 2017-02-03 DIAGNOSIS — D649 Anemia, unspecified: Secondary | ICD-10-CM

## 2017-02-03 DIAGNOSIS — F1910 Other psychoactive substance abuse, uncomplicated: Secondary | ICD-10-CM

## 2017-02-03 DIAGNOSIS — IMO0001 Reserved for inherently not codable concepts without codable children: Secondary | ICD-10-CM

## 2017-02-03 DIAGNOSIS — R52 Pain, unspecified: Secondary | ICD-10-CM

## 2017-02-03 DIAGNOSIS — A4102 Sepsis due to Methicillin resistant Staphylococcus aureus: Secondary | ICD-10-CM

## 2017-02-03 NOTE — Progress Notes (Addendum)
Location:    [facility] Room Number: 139/W Place of Service:  SNF (31)  Provider: Veleta Miners  PCP: Wille Celeste, PA-C Patient Care Team: Rolm Baptise as PCP - General (Internal Medicine)  Extended Emergency Contact Information Primary Emergency Contact: Lemke,Kelly Address: [address] [place] [address] [place] Phone: [phone] Relation: Father  Code Status: Full Code Goals of care:  Advanced Directive information Advanced Directives 02/03/2017  Does Patient Have a Medical Advance Directive? Yes  Type of Advance Directive (No Data)  Does patient want to make changes to medical advance directive? No - Patient declined  Would patient like information on creating a medical advance directive? No - Patient declined     Allergies  Allergen Reactions  . Bee Venom Anaphylaxis  . Voltaren [Diclofenac Sodium] Other (See Comments)    "Makes me feel not myself"  . Latex Rash    Chief Complaint  Patient presents with  . Discharge Note    Discharge Visit    HPI:  26 y.o. male  Patient seen today for Discharge from facility. Patient was admitted to SNF for therapy and IV Antibiotics for treatment of Disseminated MRSA infection. He was in the hospital from 07/13-07/31. Initially admitted for sepsis due to MRSA. He was found to have Epidural Abscess, Empyema, Septic Emboli. He was discharged on  IV daptomycin and oral doxycycline until 8/28 for total course of 6 weeks of IV antibiotics. He also had uncontrolled type 2 diabetes with Ketones. His A1C in the hospital was 11.9. He also had Anemia of Chronic disease.  Patient has done very well in SNF. He walks around the facility with no pain or Assist. He has had no Fever or chills. He continues to have Back pain more when he gets up in the morning. He was seen by Infectious disease and today is his Last IV dose of antibiotics He is going to continue  oral Antibiotics at home.      Past Medical History:  Diagnosis Date  . Abscess   . Diabetes mellitus without complication [facility])     Past Surgical History:  Procedure Laterality Date  . DENTAL SURGERY    . TONSILLECTOMY        reports that he has quit smoking. His smoking use included Cigarettes. He has a 1.00 pack-year smoking history. His smokeless tobacco use includes Snuff. He reports that he does not drink alcohol or use drugs. Social History   Social History  . Marital status: Single    Spouse name: N/A  . Number of children: N/A  . Years of education: N/A   Occupational History  . Not on file.   Social History Main Topics  . Smoking status: Former Smoker    Packs/day: 0.50    Years: 2.00    Types: Cigarettes  . Smokeless tobacco: Current User    Types: Snuff     Comment: Began 2006  . Alcohol use No  . Drug use: No     Comment: none per patient none since 2016  . Sexual activity: Yes     Comment: Tatoo Artist   Other Topics Concern  . Not on file   Social History Narrative  . No narrative on file   Functional Status Survey:    Allergies  Allergen Reactions  . Bee Venom Anaphylaxis  . Voltaren [Diclofenac Sodium] Other (See Comments)    "  Makes me feel not myself"  . Latex Rash    Pertinent  Health Maintenance Due  Topic Date Due  . FOOT EXAM  02/26/2017 (Originally 12/13/1998)  . OPHTHALMOLOGY EXAM  02/26/2017 (Originally 12/13/1998)  . URINE MICROALBUMIN  02/26/2017 (Originally 12/13/1998)  . INFLUENZA VACCINE  05/09/2017 (Originally 01/07/2017)  . HEMOGLOBIN A1C  06/28/2017    Medications: Outpatient Encounter Prescriptions as of 02/03/2017  Medication Sig  . cyclobenzaprine (FLEXERIL) 5 MG tablet Take 1 tablet (5 mg total) by mouth 3 (three) times daily as needed for muscle spasms.  . daptomycin (CUBICIN) IVPB Inject 860 mg into the vein daily. Indication:  Disseminated MRSA infection  Last Day of Therapy:  02/03/17 Labs - Once weekly:   CBC/D, BMP, and CPK Labs - Every other week:  ESR and CRP  . [START ON 02/04/2017] doxycycline (VIBRA-TABS) 100 MG tablet Take 1 tablet (100 mg total) by mouth every 12 (twelve) hours.  Marland Kitchen HYDROcodone-acetaminophen (NORCO/VICODIN) 5-325 MG tablet Take 1 tablet by mouth every 4 (four) hours as needed. for pain  . insulin aspart (NOVOLOG) 100 UNIT/ML injection Before each meal 3 times a day, 140-199 - 2 units, 200-250 - 4 units, 251-299 - 6 units,  300-349 - 8 units,  350 or above 10 units. Insulin PEN if approved, provide syringes and needles if needed.  . Insulin Glargine (TOUJEO MAX SOLOSTAR) 300 UNIT/ML SOPN Inject 50 Units into the skin daily.  . metFORMIN (GLUCOPHAGE) 500 MG tablet Take 500 mg by mouth 2 (two) times daily with a meal.  . polyethylene glycol (MIRALAX / GLYCOLAX) packet Take 17 g by mouth daily.  Marland Kitchen senna-docusate (SENOKOT-S) 8.6-50 MG tablet Take 2 tablets by mouth at bedtime.   No facility-administered encounter medications on file as of 02/03/2017.      Review of Systems  Vitals:   02/03/17 1237  BP: 112/76  Pulse: 96  Resp: 18  Temp: 98 F (36.7 C)  SpO2: 100%   There is no height or weight on file to calculate BMI. Physical Exam  Constitutional: He is oriented to person, place, and time. He appears well-developed and well-nourished.  HENT:  Head: Normocephalic.  Mouth/Throat: Oropharynx is clear and moist.  Neck: Neck supple.  Cardiovascular: Normal rate, regular rhythm and normal heart sounds.   No murmur heard. Pulmonary/Chest: Effort normal and breath sounds normal. No respiratory distress. He has no wheezes. He has no rales.  Abdominal: Soft. Bowel sounds are normal. He exhibits no distension. There is no rebound.  Musculoskeletal: He exhibits no edema.  Neurological: He is alert and oriented to person, place, and time.  No Focal Deficits With Good strength in all extremities.  Skin: Skin is warm and dry.  Psychiatric: He has a normal mood and affect.  His behavior is normal. Thought content normal.    Labs reviewed: Basic Metabolic Panel:  Recent Labs  12/29/16 0456 12/31/16 0432  01/06/17 0449  01/12/17 0955 01/19/17 0700 01/30/17 1800  NA 135 135  < > 135  < > 136 137 139  K 4.1 4.6  < > 3.7  < > 4.1 3.6 4.1  CL 96* 98*  < > 99*  < > 98* 100* 104  CO2 30 26  < > 28  < > _0 GLUCOSE 91 100*  < > 143*  < > 127* 103* 152*  BUN 9 10  < > 10  < > _1 CREATININE 0.52* 0.53*  < > 0.48*  < >  0.60* 0.49* 0.65  CALCIUM 9.3 9.4  < > 9.0  < > 9.6 9.1 9.6  MG 1.8 1.8  --  1.7  --   --   --   --   < > = values in this interval not displayed. Liver Function Tests:  Recent Labs  12/29/16 0456 12/31/16 0432 01/30/17 1800  AST _0 ALT 53 34 20  ALKPHOS 101 89 57  BILITOT 0.5 0.2* 0.5  PROT 7.7 7.7 8.3*  ALBUMIN 2.5* 2.8* 4.3   No results for input(s): LIPASE, AMYLASE in the last 8760 hours. No results for input(s): AMMONIA in the last 8760 hours. CBC:  Recent Labs  01/12/17 0955 01/19/17 0700 01/30/17 1800  WBC 10.5 6.2 8.0  NEUTROABS 6.6 3.0 4.1  HGB 11.5* 11.1* 12.3*  HCT 35.2* 34.1* 37.1*  MCV 82.6 82.4 81.7  PLT 454* 339 317   Cardiac Enzymes:  Recent Labs  12/29/16 0456 01/05/17 0432 01/29/17 0752  CKTOTAL 24* 31* 106   BNP: Invalid input(s): POCBNP CBG:  Recent Labs  01/06/17 0601 01/06/17 1114 01/06/17 1554  GLUCAP 147* 109* 120*    Procedures and Imaging Studies During Stay: Dg Chest 2 View  Result Date: 01/15/2017 CLINICAL DATA:  Bilateral pleural effusions. EXAM: CHEST  2 VIEW COMPARISON:  01/01/2017 and 12/24/2016 FINDINGS: Right-sided PICC line has tip within the SVC. Lungs are adequately inflated as the right lung is clear. There is persistent opacification over the lateral left lung unchanged to slightly worse compatible with known empyema/pleural fluid collection. Stable nodule opacity over the left apex. Remainder the exam is unchanged. IMPRESSION: Opacification over the  lateral left forearm unchanged to slightly worse compatible with known empyema/ pleural fluid collection. Stable nodule opacity over the left apex. Recommend attention on follow-up. Right-sided PICC line with tip in the SVC. Electronically Signed   By: Marin Olp M.D.   On: 01/15/2017 11:30    Assessment/Plan:    Sepsis due to MRSA Patient had Disseminated MRSA infection with Epidural abscess, Osteomyelitis and Empyema. He has finished 6 weeks of IV Antibiotics. And is now on PO antibiotics. He continues to do well. Follow up with Infectious disease and Thoracic surgery in 4 weeks.   Insulin-dependent diabetes mellitus with ketoacidosis His BS have been mainly less then 200 in facility. Discharge on Toujeo and Metformin. Patient knows how to inject and take care of his Diabetes.  Normocytic anemia HGB has improved since he has been here. Continue to follow as outpatient.  Hypoalbuminemia Completely resolved and patient has gained weight since he has been here  Polysubstance abuse Had Long discussion with patient . Explained him that he is at high risk of recurrence of infection.   Pain management Patient says he takes Hydrocodone for Pain and Flexeril only at night. Will provide 2 week supply. He needs to follow with his Primary care for further management of his pain  Disposition Home to with his wife.    Future labs/tests needed:  As per Infectious disease. Follow up with Primary care for Diabetes and pain management.  Discharge note including coordination of care was More then 30 min.

## 2017-02-12 ENCOUNTER — Ambulatory Visit: Payer: Medicaid - Out of State | Admitting: Cardiothoracic Surgery

## 2017-02-17 ENCOUNTER — Other Ambulatory Visit: Payer: Self-pay

## 2017-02-17 DIAGNOSIS — J90 Pleural effusion, not elsewhere classified: Secondary | ICD-10-CM

## 2017-02-24 ENCOUNTER — Ambulatory Visit: Payer: Medicaid - Out of State | Admitting: Cardiothoracic Surgery

## 2017-03-03 ENCOUNTER — Ambulatory Visit: Payer: Medicaid - Out of State | Admitting: Internal Medicine

## 2017-03-19 ENCOUNTER — Ambulatory Visit: Payer: Medicaid - Out of State | Admitting: Internal Medicine

## 2017-03-29 ENCOUNTER — Other Ambulatory Visit: Payer: Self-pay | Admitting: Internal Medicine

## 2017-03-30 ENCOUNTER — Telehealth: Payer: Self-pay | Admitting: *Deleted

## 2017-03-30 ENCOUNTER — Other Ambulatory Visit: Payer: Self-pay | Admitting: Internal Medicine

## 2017-03-30 MED ORDER — DOXYCYCLINE HYCLATE 100 MG PO TABS: 100.0000 mg | ORAL_TABLET | Freq: Two times a day (BID) | ORAL | 3 refills | Status: DC

## 2017-03-30 NOTE — Telephone Encounter (Signed)
Patient requesting refill of doxy. He was given 1 month supply in August, was supposed to follow up mid September, but cancelled 2x. Did not reschedule. Please advise on refill request for doxycycline. Andree CossHowell, Michelle M, RN

## 2017-03-30 NOTE — Telephone Encounter (Signed)
Yes, definitely.  I sent in 1 month with 3 refills.  He should follow up here or with someone for ESR, CRP in the next month or so.

## 2017-03-31 ENCOUNTER — Other Ambulatory Visit: Payer: Self-pay | Admitting: Internal Medicine

## 2017-03-31 NOTE — Telephone Encounter (Signed)
Unable to contact patient to confirm refill (4 months) sent to pharmacy, set up follow up appointment. No voicemail set up/unable to leave a message on both listed numbers.

## 2018-03-24 IMAGING — DX DG CHEST 2V
2 series · 2 of 2 positions shown · non-contrast
Comparison: 12/30/2016

CLINICAL DATA: Pleural effusion, shortness of Breath

EXAM:
CHEST  2 VIEW

[w chest pa]
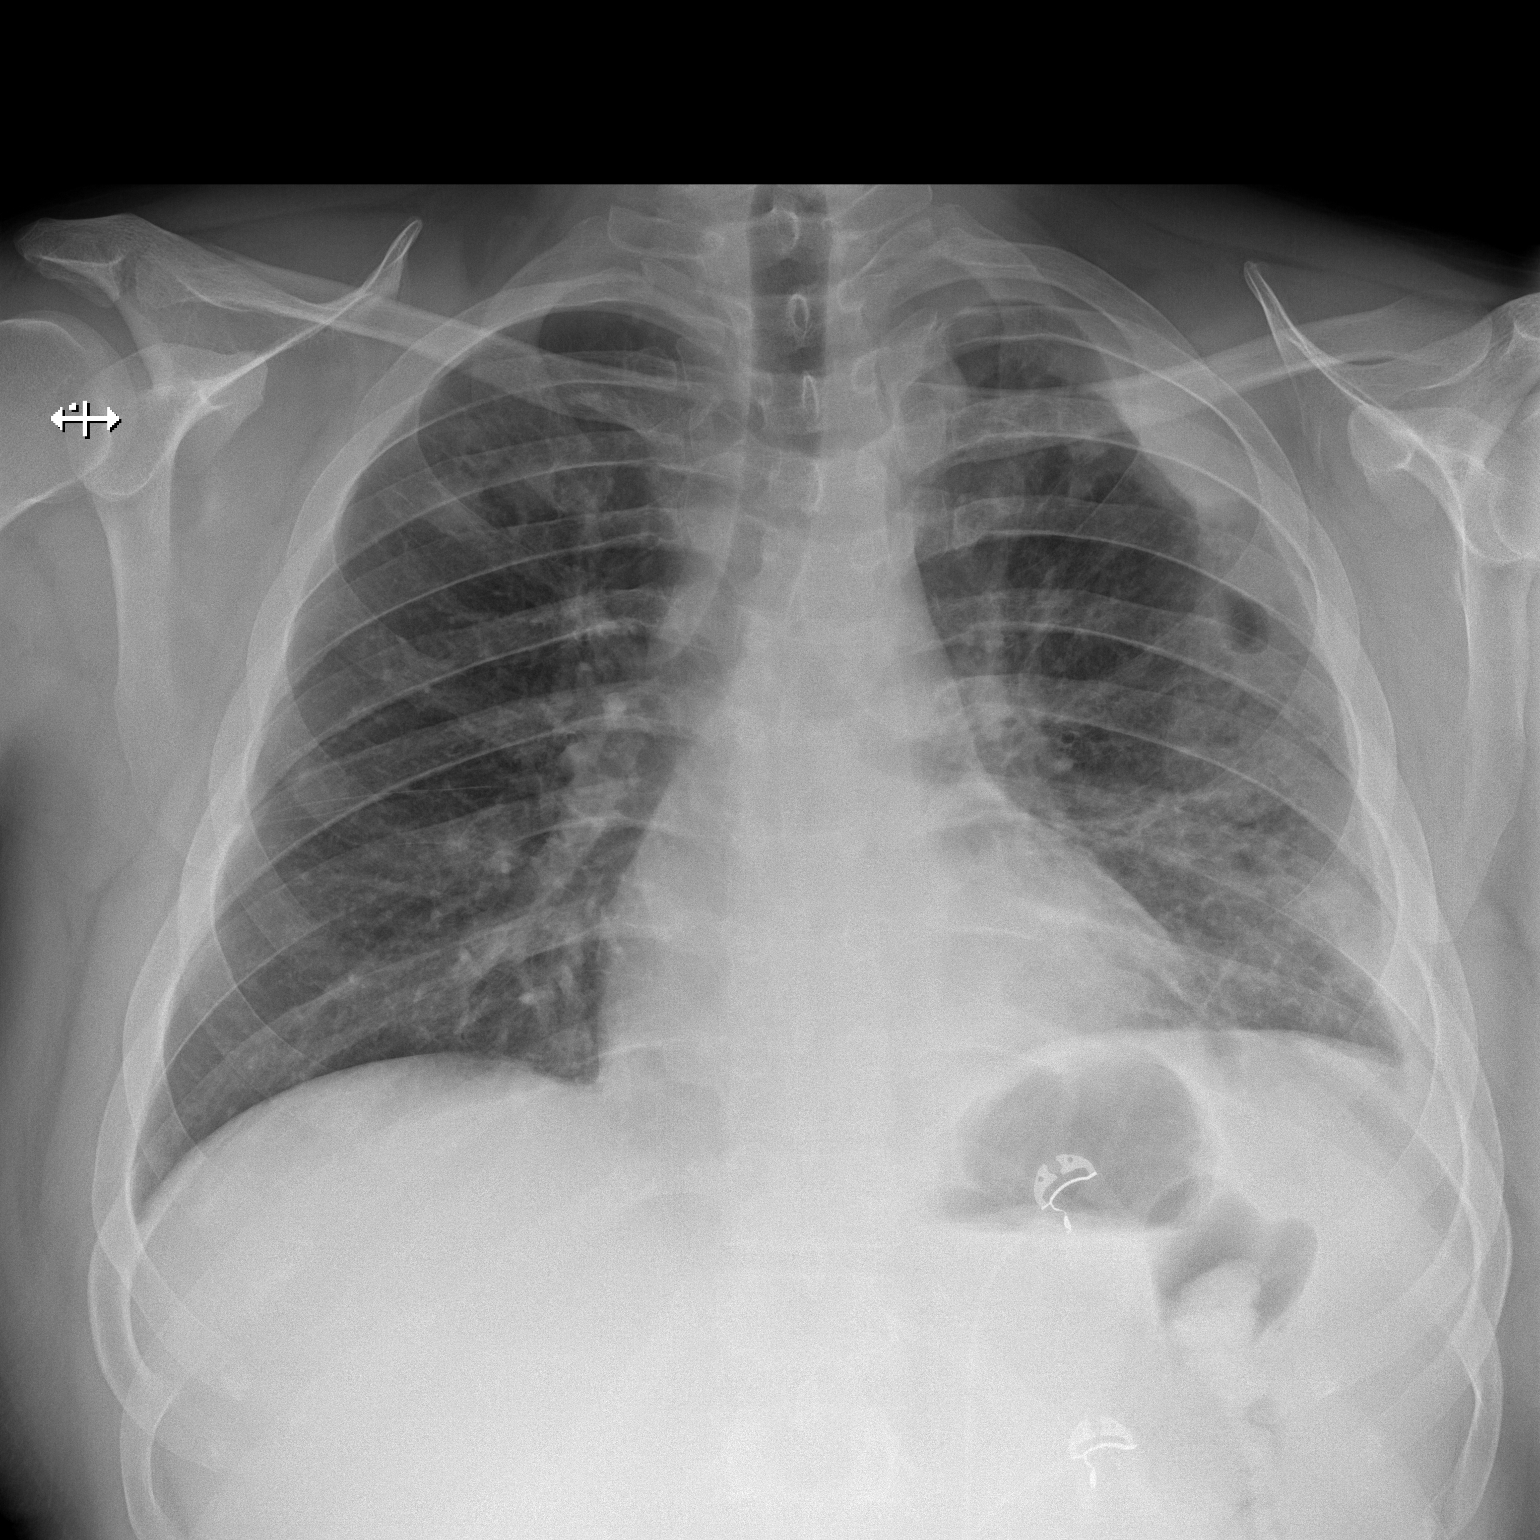

[w chest lat]
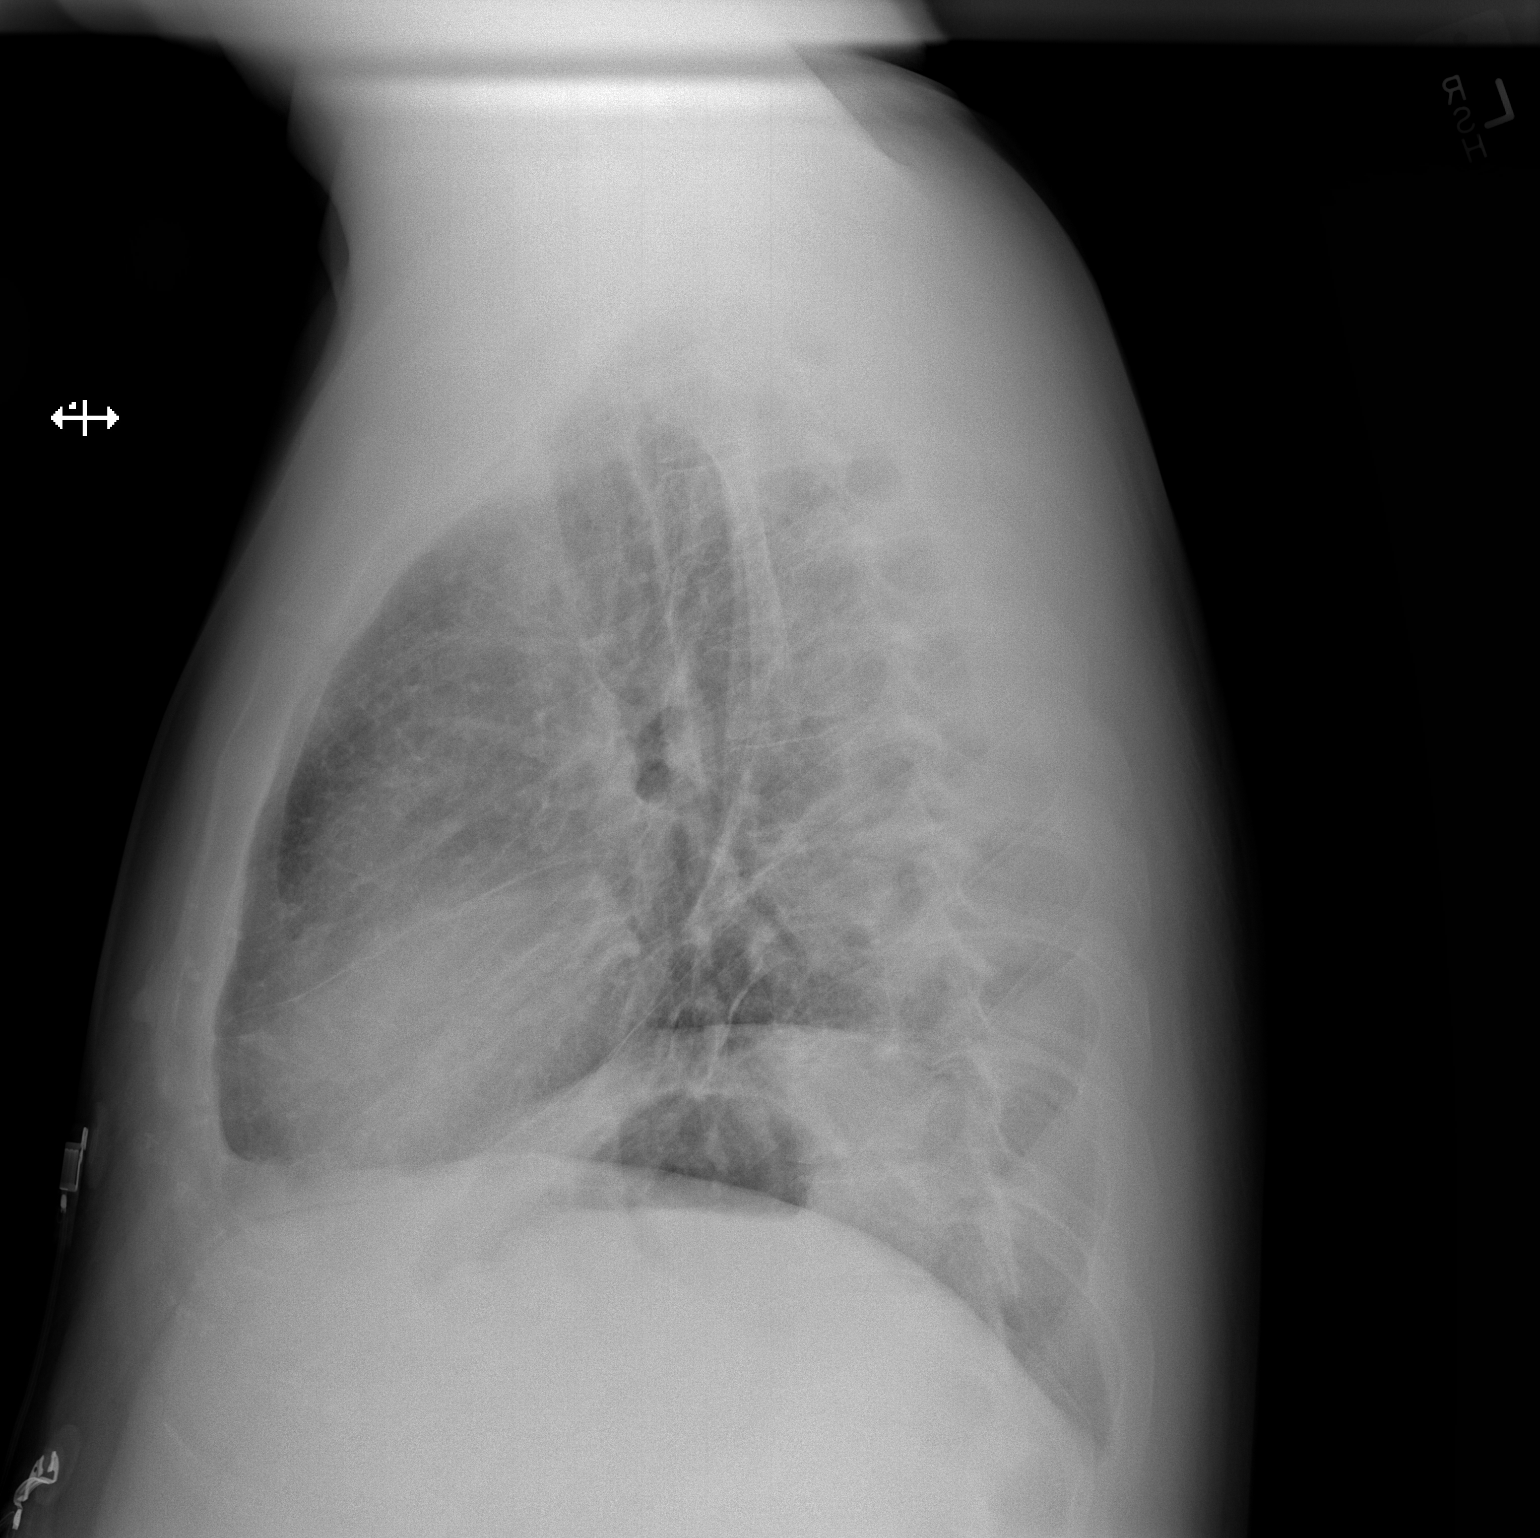

[2 of 2 positions shown; findings below may reference images not displayed]

FINDINGS: Small amount of pleural air again noted on the left. Small left
pleural effusion, with loculated effusion projecting over the left
lateral upper lobe. Nodular airspace disease throughout the left
lung and in the right upper lobe.
IMPRESSION: Stable loculated left effusion and small loculated left
pneumothorax.

Stable nodular airspace disease bilaterally.

## 2018-03-25 IMAGING — DX DG CHEST 2V
2 series · 2 of 2 positions shown · non-contrast
Comparison: Radiographs December 31, 2016.

CLINICAL DATA: Shortness of breath, pleural effusion.

EXAM:
CHEST  2 VIEW

[w chest pa]
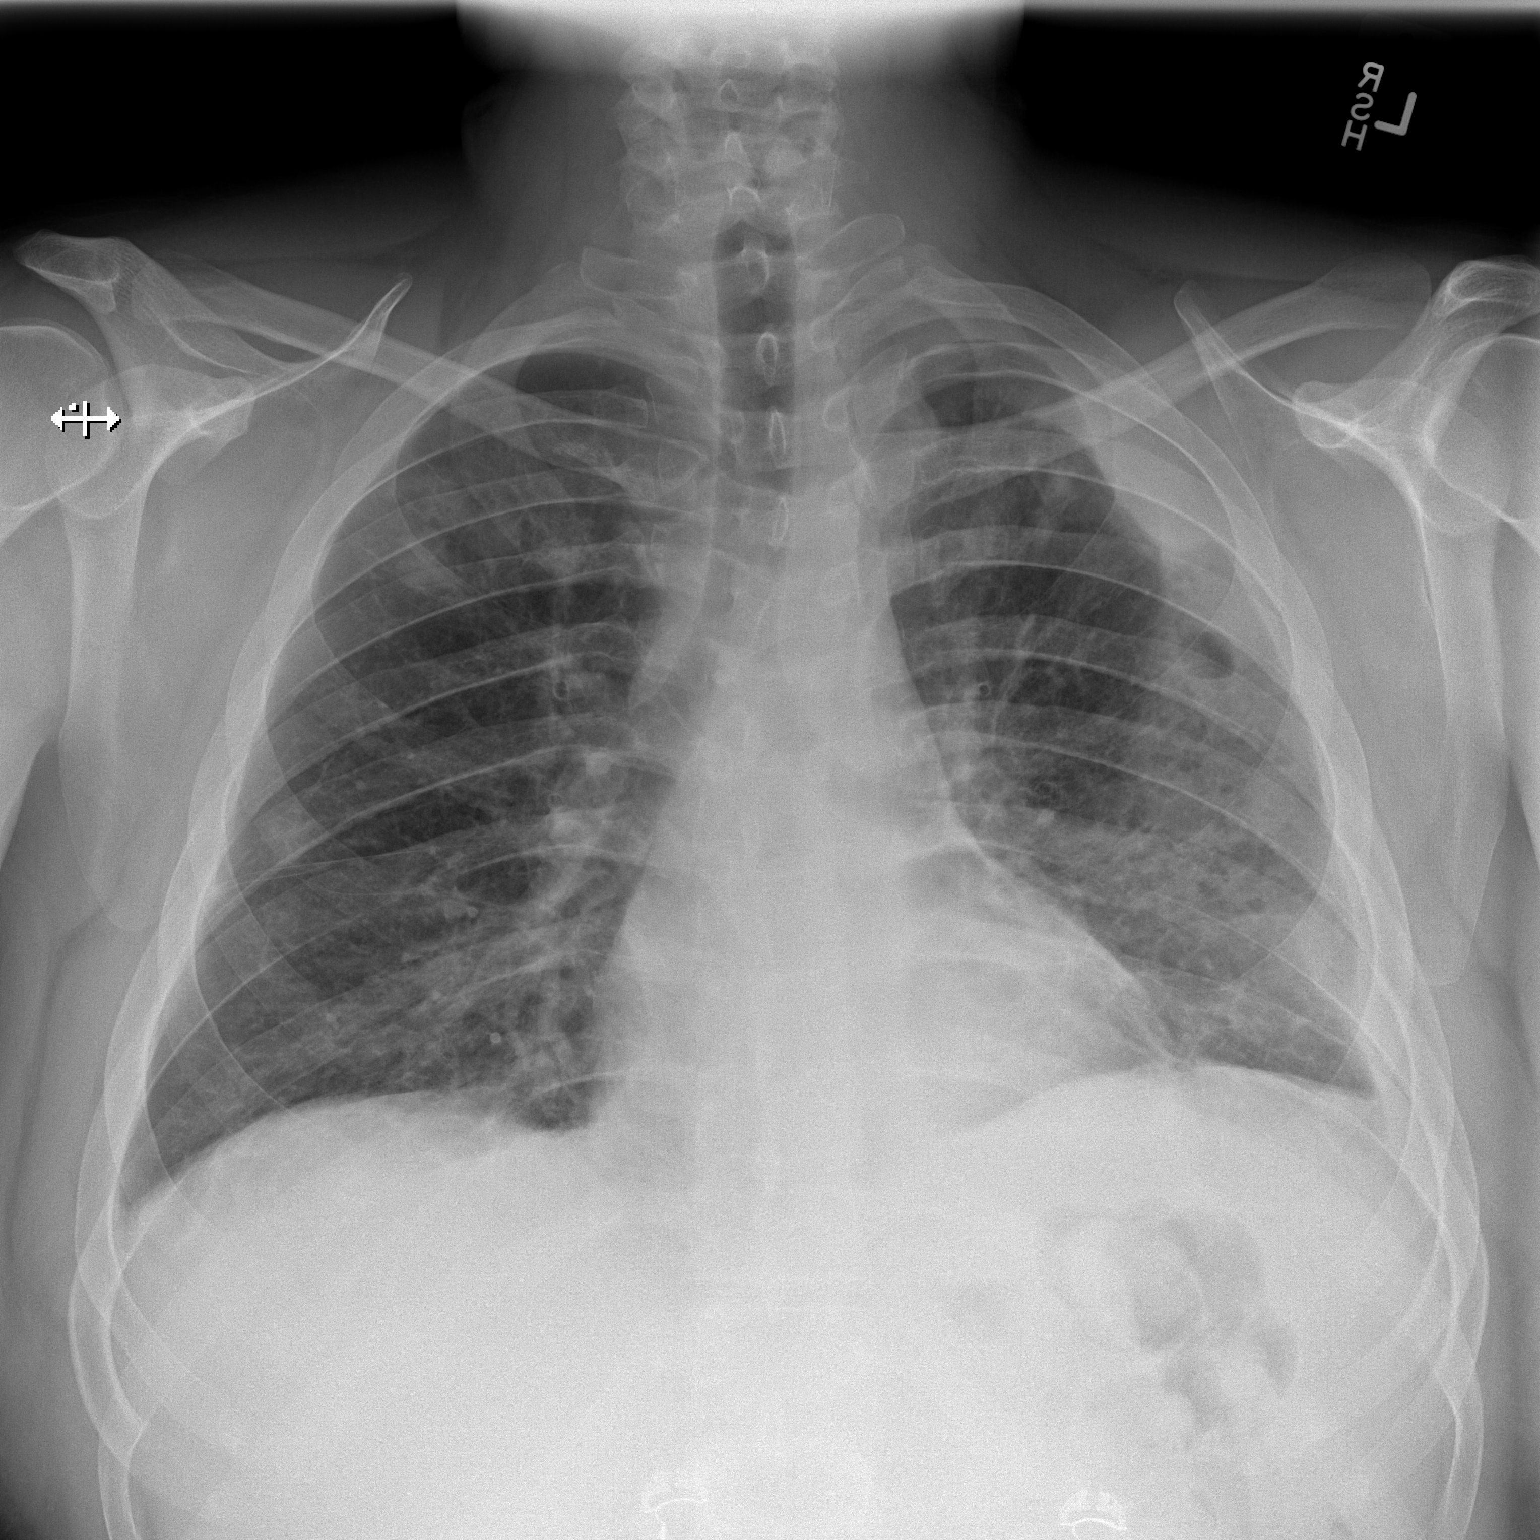

[w chest lat]
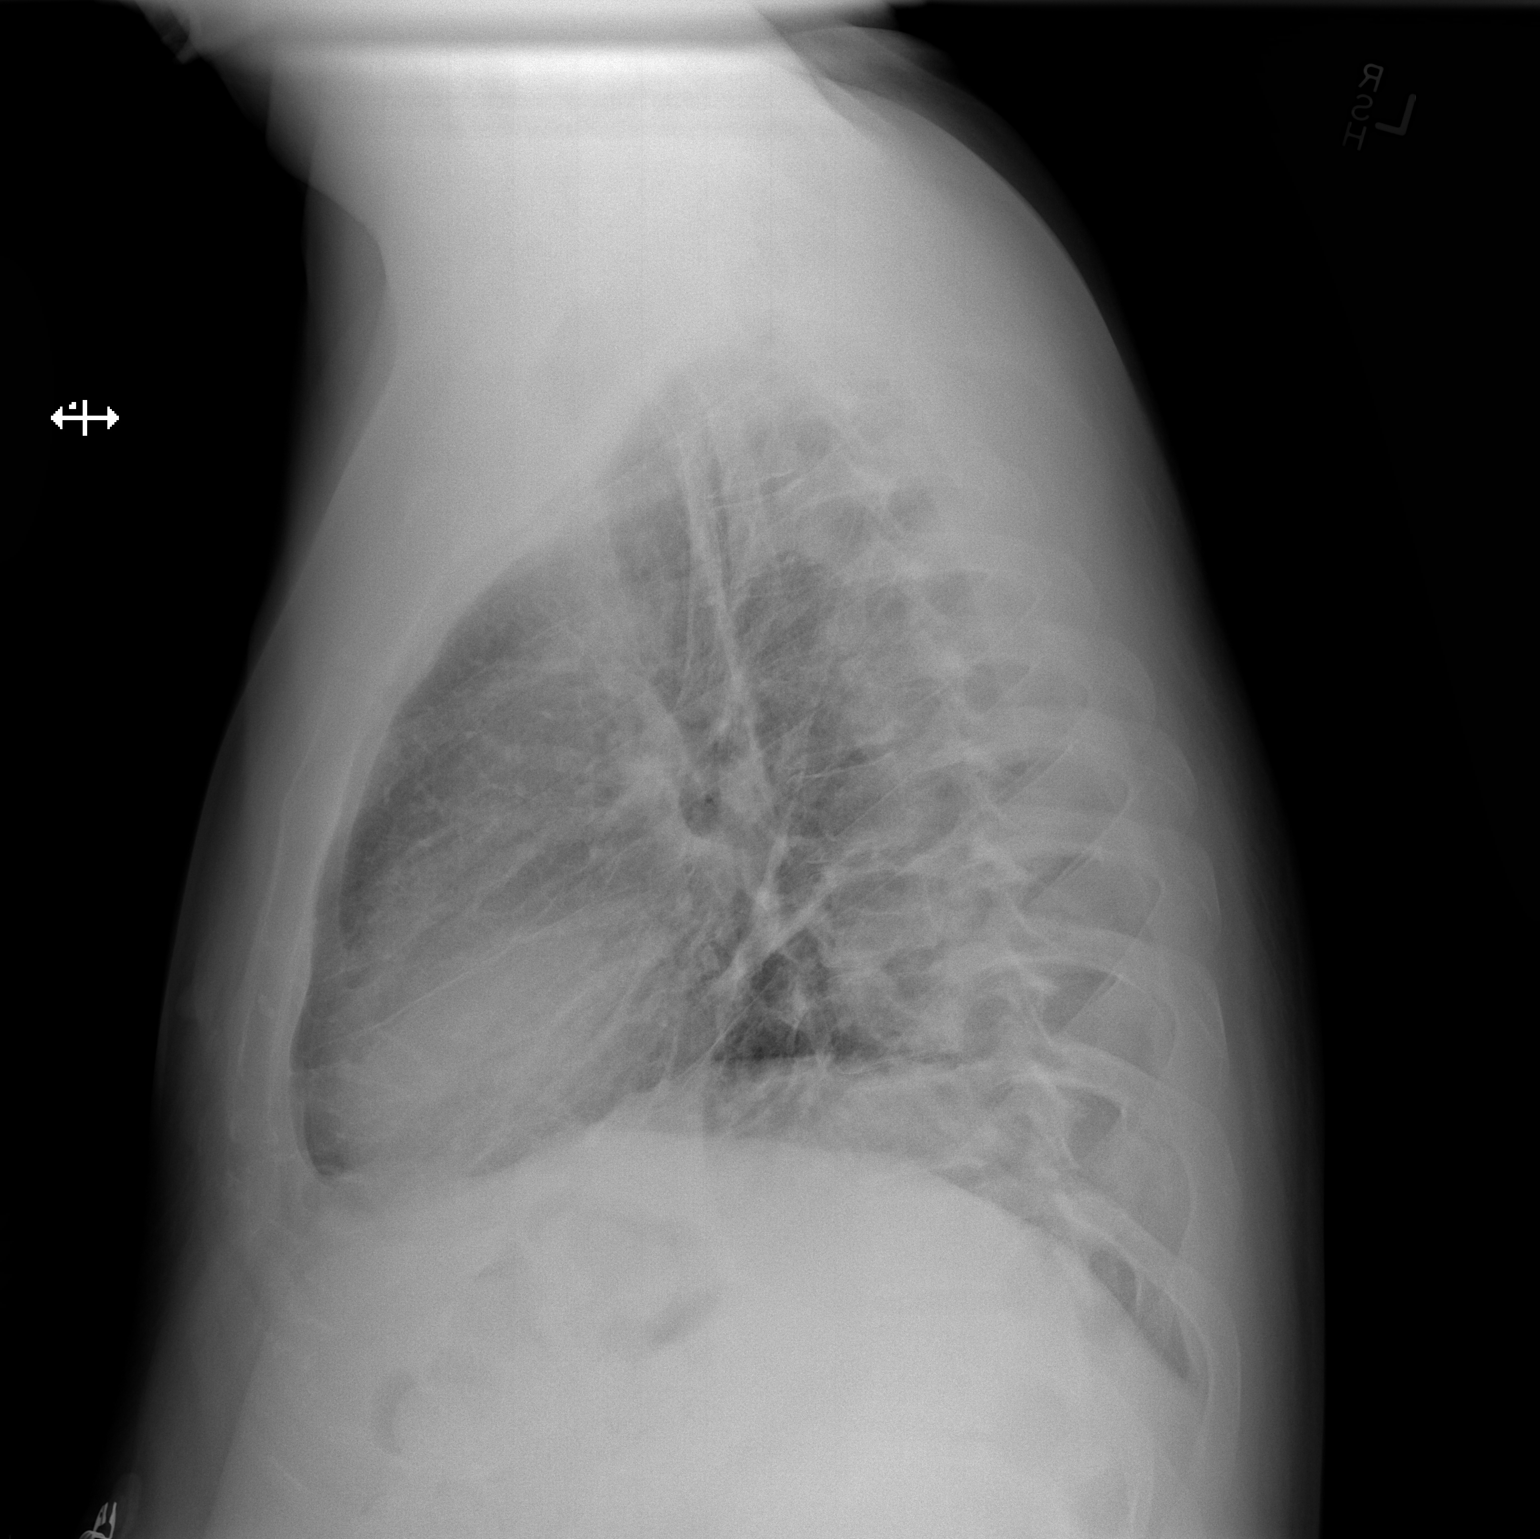

[2 of 2 positions shown; findings below may reference images not displayed]

FINDINGS: The heart size and mediastinal contours are within normal limits.
Stable right upper lobe airspace opacity is noted concerning for
possible infiltrate. Stable loculated pleural effusion is seen along
left chest wall. Stable air component is noted within this effusion.
Left basilar atelectasis or infiltrate cannot be excluded. The
visualized skeletal structures are unremarkable.
IMPRESSION: Stable loculated left pleural effusion. Stable gas component of this
effusion is noted laterally. Stable right upper lobe and left lower
lobe opacities are noted.

## 2018-04-08 IMAGING — CR DG CHEST 2V
2 series · 2 of 2 positions shown · non-contrast
Comparison: 01/01/2017 and 12/24/2016

CLINICAL DATA: Bilateral pleural effusions.

EXAM:
CHEST  2 VIEW

[w chest pa]
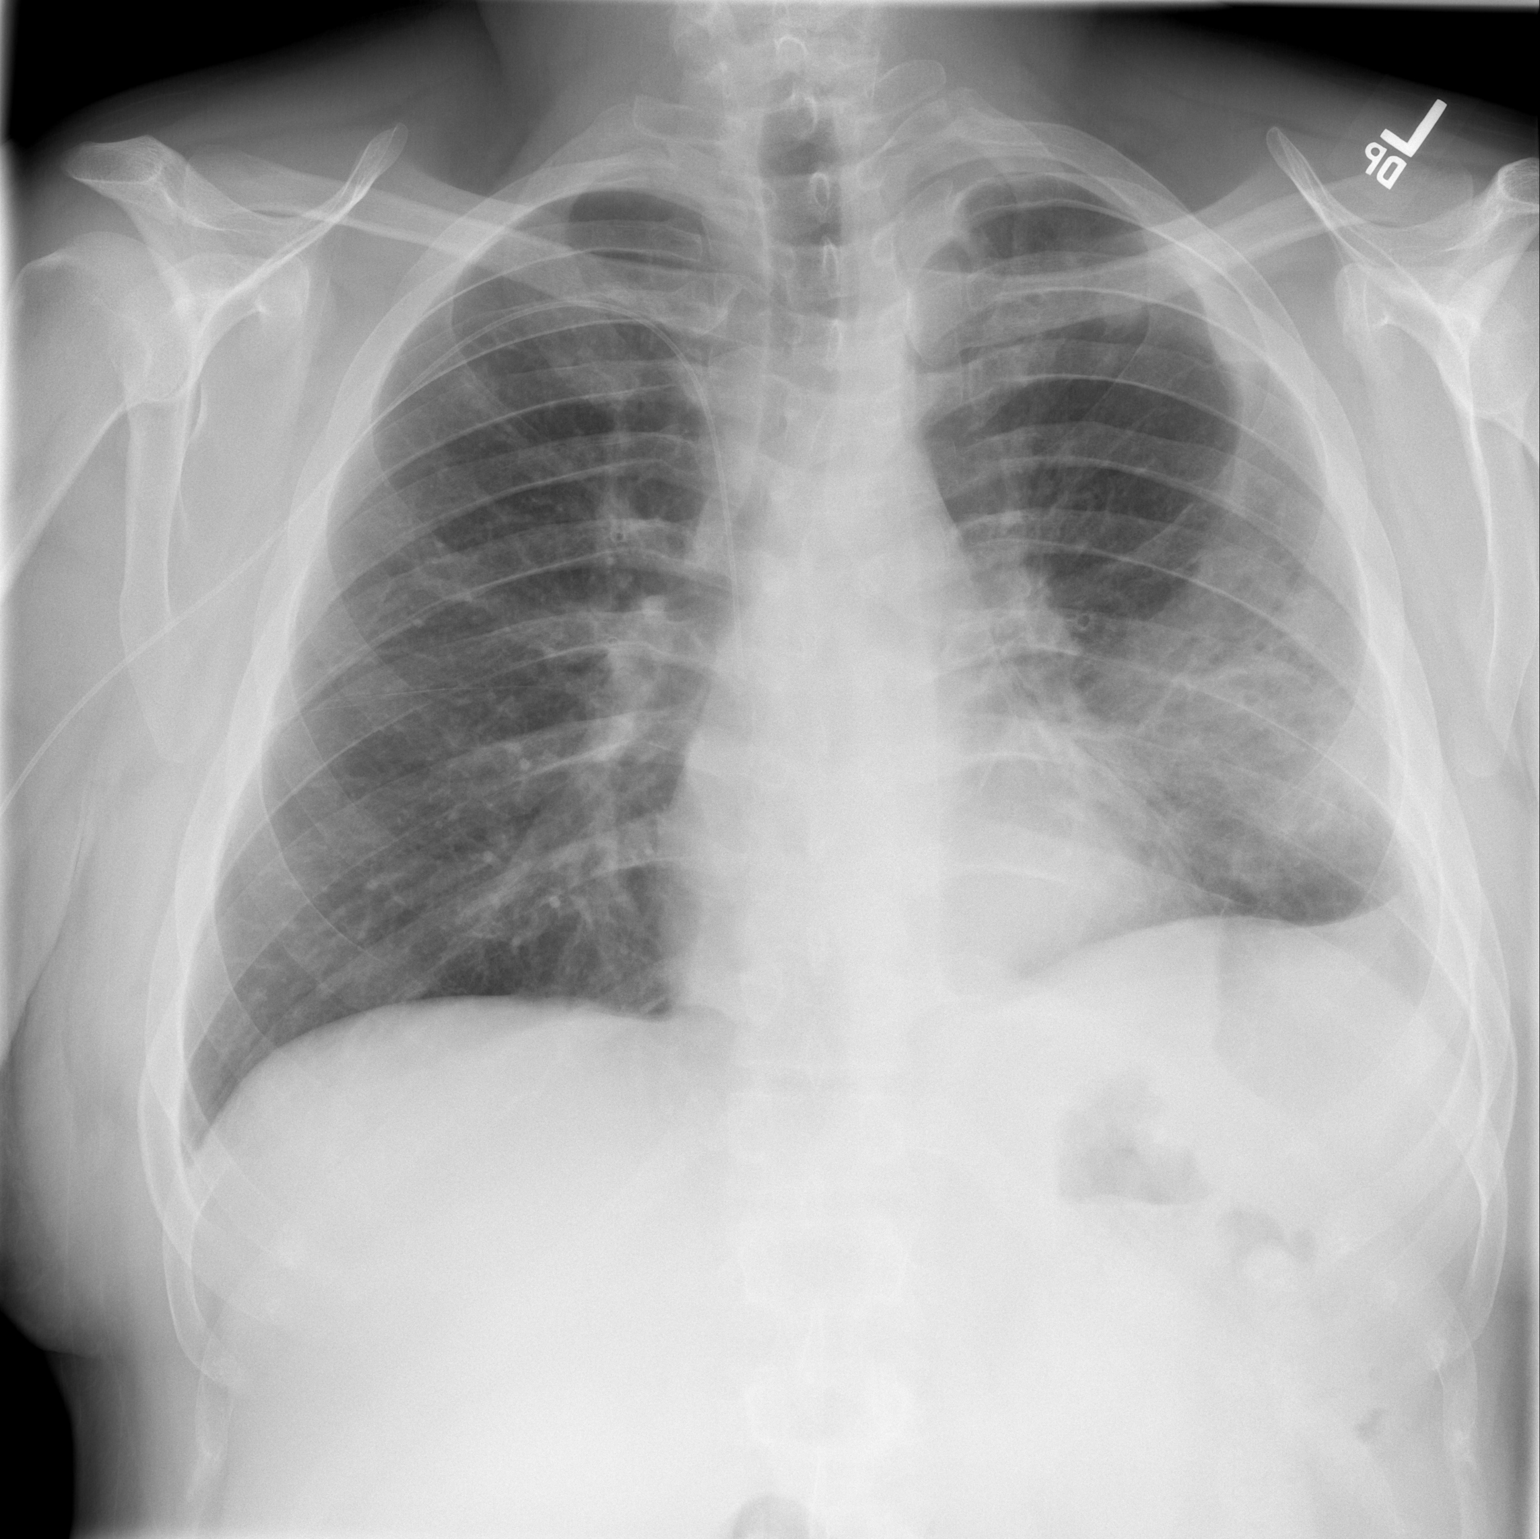

[w chest lat]
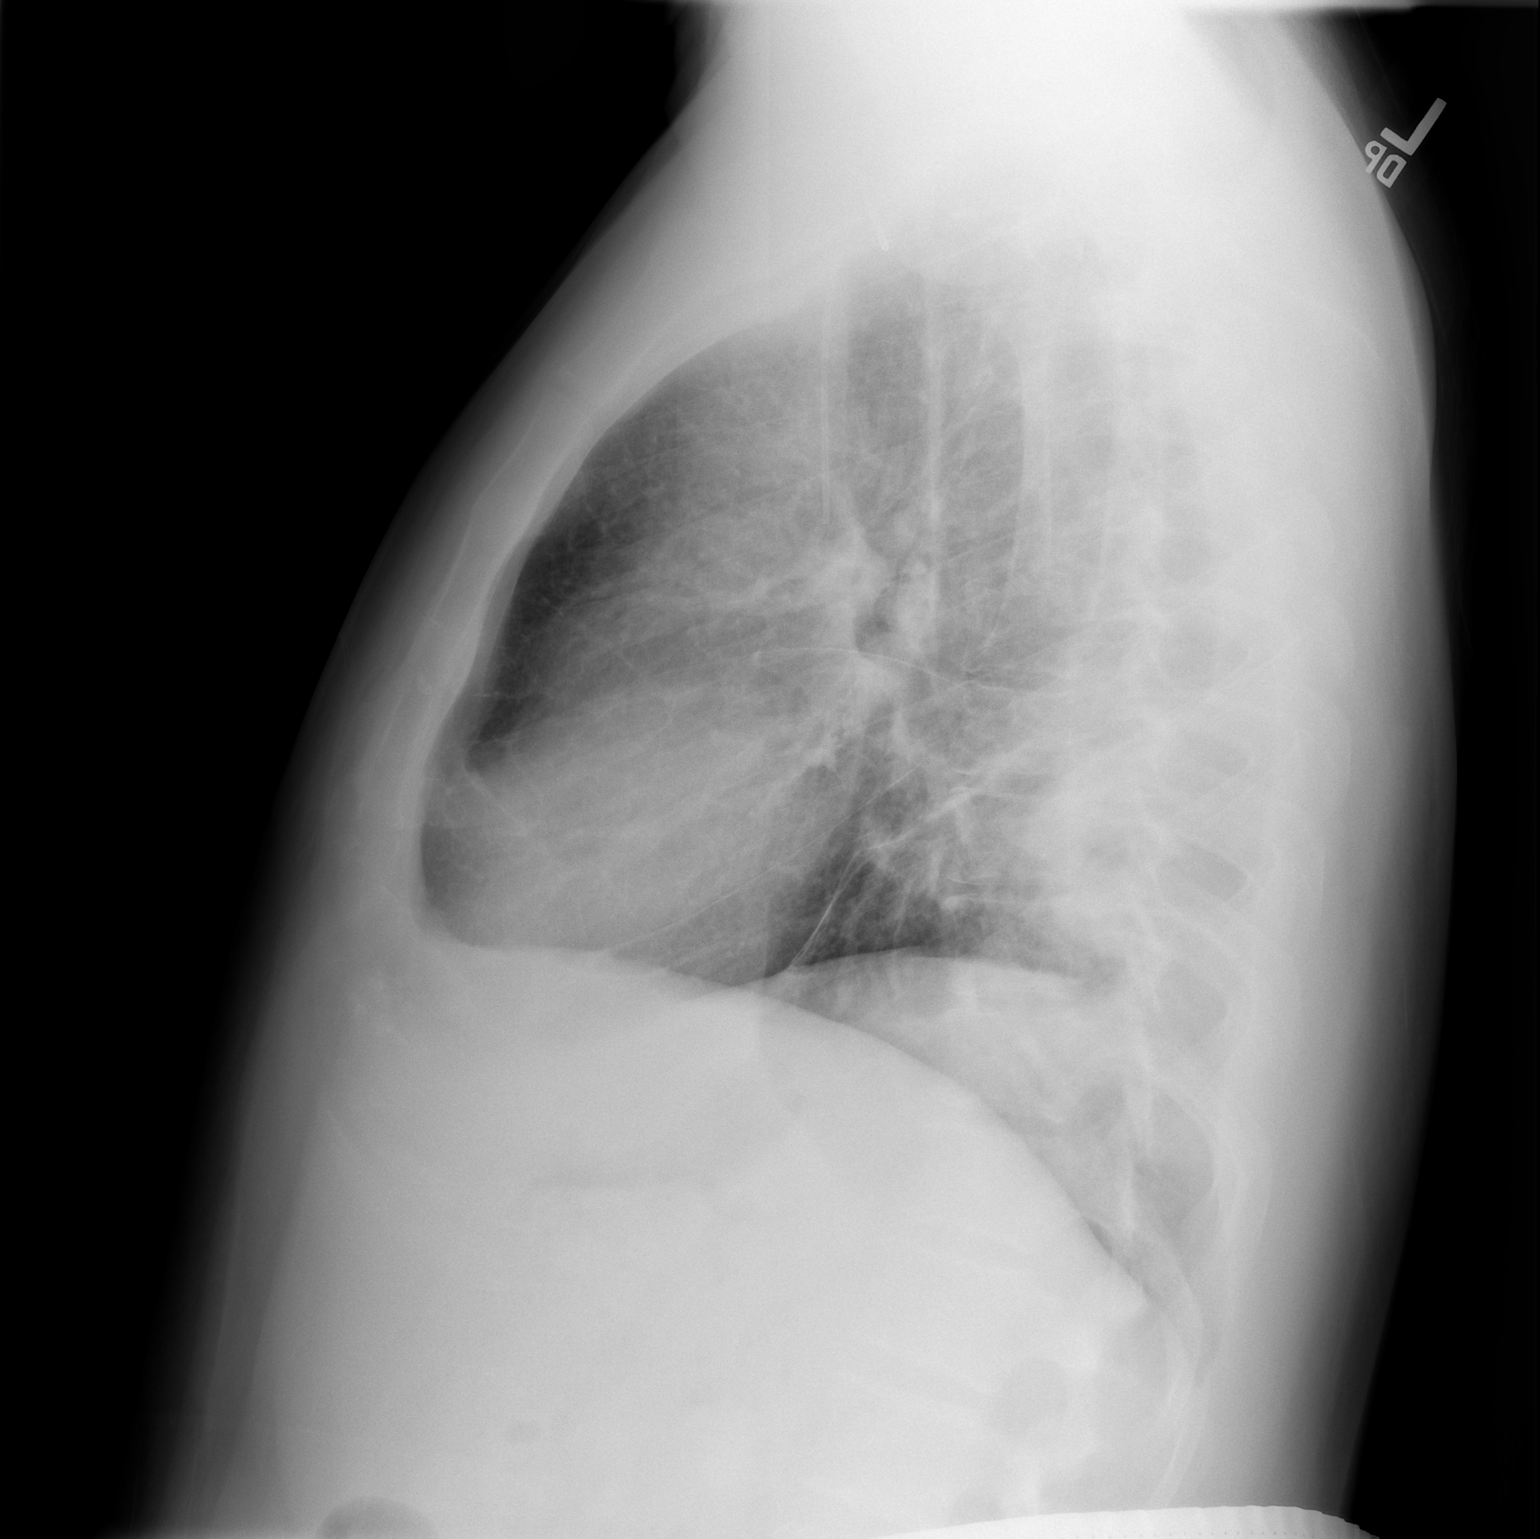

[2 of 2 positions shown; findings below may reference images not displayed]

FINDINGS: Right-sided PICC line has tip within the SVC. Lungs are adequately
inflated as the right lung is clear. There is persistent
opacification over the lateral left lung unchanged to slightly worse
compatible with known empyema/pleural fluid collection. Stable
nodule opacity over the left apex. Remainder the exam is unchanged.
IMPRESSION: Opacification over the lateral left forearm unchanged to slightly
worse compatible with known empyema/ pleural fluid collection.
Stable nodule opacity over the left apex. Recommend attention on
follow-up.

Right-sided PICC line with tip in the SVC.

## 2019-03-30 DIAGNOSIS — F331 Major depressive disorder, recurrent, moderate: Secondary | ICD-10-CM

## 2019-03-30 HISTORY — DX: Major depressive disorder, recurrent, moderate: F33.1

## 2020-10-13 ENCOUNTER — Encounter (HOSPITAL_COMMUNITY)
Admission: AD | Disposition: A | Discharge status: Home / Self Care | Payer: Self-pay | Source: Other Acute Inpatient Hospital | Attending: Internal Medicine

## 2020-10-13 ENCOUNTER — Inpatient Hospital Stay (HOSPITAL_COMMUNITY): Payer: Medicaid Other | Admitting: Anesthesiology

## 2020-10-13 ENCOUNTER — Inpatient Hospital Stay (HOSPITAL_COMMUNITY)
Admission: AD | Admit: 2020-10-13 | Discharge: 2020-10-16 | DRG: 982 | Disposition: A | Discharge status: Home / Self Care | Payer: Medicaid Other | Source: Other Acute Inpatient Hospital | Attending: Internal Medicine | Admitting: Internal Medicine

## 2020-10-13 ENCOUNTER — Inpatient Hospital Stay (HOSPITAL_COMMUNITY): Payer: Medicaid Other

## 2020-10-13 ENCOUNTER — Encounter (HOSPITAL_COMMUNITY): Payer: Self-pay | Admitting: Orthopaedic Surgery

## 2020-10-13 DIAGNOSIS — E1152 Type 2 diabetes mellitus with diabetic peripheral angiopathy with gangrene: Secondary | ICD-10-CM | POA: Diagnosis present

## 2020-10-13 DIAGNOSIS — F1990 Other psychoactive substance use, unspecified, uncomplicated: Secondary | ICD-10-CM | POA: Diagnosis present

## 2020-10-13 DIAGNOSIS — Z794 Long term (current) use of insulin: Secondary | ICD-10-CM | POA: Diagnosis not present

## 2020-10-13 DIAGNOSIS — Z8614 Personal history of Methicillin resistant Staphylococcus aureus infection: Secondary | ICD-10-CM | POA: Diagnosis not present

## 2020-10-13 DIAGNOSIS — E871 Hypo-osmolality and hyponatremia: Secondary | ICD-10-CM | POA: Diagnosis present

## 2020-10-13 DIAGNOSIS — Z7984 Long term (current) use of oral hypoglycemic drugs: Secondary | ICD-10-CM

## 2020-10-13 DIAGNOSIS — L02512 Cutaneous abscess of left hand: Secondary | ICD-10-CM | POA: Diagnosis present

## 2020-10-13 DIAGNOSIS — Z9111 Patient's noncompliance with dietary regimen: Secondary | ICD-10-CM

## 2020-10-13 DIAGNOSIS — I96 Gangrene, not elsewhere classified: Secondary | ICD-10-CM | POA: Diagnosis present

## 2020-10-13 DIAGNOSIS — R7401 Elevation of levels of liver transaminase levels: Secondary | ICD-10-CM | POA: Diagnosis present

## 2020-10-13 DIAGNOSIS — Z79899 Other long term (current) drug therapy: Secondary | ICD-10-CM | POA: Diagnosis not present

## 2020-10-13 DIAGNOSIS — Z888 Allergy status to other drugs, medicaments and biological substances status: Secondary | ICD-10-CM | POA: Diagnosis not present

## 2020-10-13 DIAGNOSIS — E1165 Type 2 diabetes mellitus with hyperglycemia: Secondary | ICD-10-CM | POA: Diagnosis present

## 2020-10-13 DIAGNOSIS — Z91040 Latex allergy status: Secondary | ICD-10-CM

## 2020-10-13 DIAGNOSIS — Z833 Family history of diabetes mellitus: Secondary | ICD-10-CM

## 2020-10-13 DIAGNOSIS — W57XXXA Bitten or stung by nonvenomous insect and other nonvenomous arthropods, initial encounter: Secondary | ICD-10-CM | POA: Diagnosis present

## 2020-10-13 DIAGNOSIS — F1110 Opioid abuse, uncomplicated: Secondary | ICD-10-CM | POA: Diagnosis present

## 2020-10-13 DIAGNOSIS — E869 Volume depletion, unspecified: Secondary | ICD-10-CM | POA: Diagnosis present

## 2020-10-13 DIAGNOSIS — F17210 Nicotine dependence, cigarettes, uncomplicated: Secondary | ICD-10-CM | POA: Diagnosis present

## 2020-10-13 DIAGNOSIS — R748 Abnormal levels of other serum enzymes: Secondary | ICD-10-CM

## 2020-10-13 DIAGNOSIS — B9561 Methicillin susceptible Staphylococcus aureus infection as the cause of diseases classified elsewhere: Secondary | ICD-10-CM | POA: Diagnosis present

## 2020-10-13 DIAGNOSIS — L02519 Cutaneous abscess of unspecified hand: Secondary | ICD-10-CM | POA: Diagnosis present

## 2020-10-13 DIAGNOSIS — Z8249 Family history of ischemic heart disease and other diseases of the circulatory system: Secondary | ICD-10-CM | POA: Diagnosis not present

## 2020-10-13 HISTORY — PX: I & D EXTREMITY: SHX5045

## 2020-10-13 LAB — COMPREHENSIVE METABOLIC PANEL
ALT: 137 U/L — ABNORMAL HIGH (ref 0–44)
AST: 82 U/L — ABNORMAL HIGH (ref 15–41)
Albumin: 2.4 g/dL — ABNORMAL LOW (ref 3.5–5.0)
Alkaline Phosphatase: 127 U/L — ABNORMAL HIGH (ref 38–126)
Anion gap: 7 (ref 5–15)
BUN: <5 mg/dL — ABNORMAL LOW (ref 6–20)
CO2: 23 mmol/L (ref 22–32)
Calcium: 8.1 mg/dL — ABNORMAL LOW (ref 8.9–10.3)
Chloride: 104 mmol/L (ref 98–111)
Creatinine, Ser: 0.4 mg/dL — ABNORMAL LOW (ref 0.61–1.24)
GFR, Estimated: >60 mL/min (ref >60)
Glucose, Bld: 187 mg/dL — ABNORMAL HIGH (ref 70–99)
Potassium: 3.8 mmol/L (ref 3.5–5.1)
Sodium: 134 mmol/L — ABNORMAL LOW (ref 135–145)
Total Bilirubin: 0.7 mg/dL (ref 0.3–1.2)
Total Protein: 6.1 g/dL — ABNORMAL LOW (ref 6.5–8.1)

## 2020-10-13 LAB — CBC
HCT: 38.5 % — ABNORMAL LOW (ref 39.0–52.0)
Hemoglobin: 13.1 g/dL (ref 13.0–17.0)
MCH: 29.7 pg (ref 26.0–34.0)
MCHC: 34 g/dL (ref 30.0–36.0)
MCV: 87.3 fL (ref 80.0–100.0)
Platelets: 304 10*3/uL (ref 150–400)
RBC: 4.41 MIL/uL (ref 4.22–5.81)
RDW: 13.1 % (ref 11.5–15.5)
WBC: 12.1 10*3/uL — ABNORMAL HIGH (ref 4.0–10.5)
nRBC: 0 % (ref 0.0–0.2)

## 2020-10-13 LAB — GLUCOSE, CAPILLARY
Glucose-Capillary: 314 mg/dL — ABNORMAL HIGH (ref 70–99)
Glucose-Capillary: 251 mg/dL — ABNORMAL HIGH (ref 70–99)
Glucose-Capillary: 187 mg/dL — ABNORMAL HIGH (ref 70–99)

## 2020-10-13 LAB — PROTIME-INR
INR: 1.1 (ref 0.8–1.2)
Prothrombin Time: 13.9 seconds (ref 11.4–15.2)

## 2020-10-13 LAB — ACETAMINOPHEN LEVEL: Acetaminophen (Tylenol), Serum: 15 ug/mL (ref 10–30)

## 2020-10-13 LAB — PHOSPHORUS: Phosphorus: 3 mg/dL (ref 2.5–4.6)

## 2020-10-13 LAB — APTT: aPTT: 28 seconds (ref 24–36)

## 2020-10-13 LAB — MAGNESIUM: Magnesium: 1.6 mg/dL — ABNORMAL LOW (ref 1.7–2.4)

## 2020-10-13 LAB — ETHANOL: Alcohol, Ethyl (B): <10 mg/dL (ref <10)

## 2020-10-13 LAB — HEMOGLOBIN A1C
Hgb A1c MFr Bld: 14.3 % — ABNORMAL HIGH (ref 4.8–5.6)
Mean Plasma Glucose: 363.71 mg/dL

## 2020-10-13 SURGERY — IRRIGATION AND DEBRIDEMENT EXTREMITY
Anesthesia: General | Site: Hand | Laterality: Left

## 2020-10-13 MED ORDER — INSULIN ASPART 100 UNIT/ML IJ SOLN
INTRAMUSCULAR | Status: AC
  Filled 2020-10-13: qty 1

## 2020-10-13 MED ORDER — DEXMEDETOMIDINE (PRECEDEX) IN NS 20 MCG/5ML (4 MCG/ML) IV SYRINGE
PREFILLED_SYRINGE | INTRAVENOUS | Status: DC | PRN
  Administered 2020-10-13 (×5): 4 ug via INTRAVENOUS

## 2020-10-13 MED ORDER — SUGAMMADEX SODIUM 200 MG/2ML IV SOLN
INTRAVENOUS | Status: DC | PRN
  Administered 2020-10-13: 200 mg via INTRAVENOUS

## 2020-10-13 MED ORDER — CEFAZOLIN SODIUM-DEXTROSE 2-3 GM-%(50ML) IV SOLR
INTRAVENOUS | Status: DC | PRN
  Administered 2020-10-13: 2 g via INTRAVENOUS

## 2020-10-13 MED ORDER — MIDAZOLAM HCL 2 MG/2ML IJ SOLN
INTRAMUSCULAR | Status: AC
  Filled 2020-10-13: qty 2

## 2020-10-13 MED ORDER — FENTANYL CITRATE (PF) 250 MCG/5ML IJ SOLN
INTRAMUSCULAR | Status: AC
  Filled 2020-10-13: qty 5

## 2020-10-13 MED ORDER — LACTATED RINGERS IV SOLN: INTRAVENOUS | Status: DC | PRN

## 2020-10-13 MED ORDER — FENTANYL CITRATE (PF) 100 MCG/2ML IJ SOLN
INTRAMUSCULAR | Status: DC | PRN
  Administered 2020-10-13 (×4): 50 ug via INTRAVENOUS

## 2020-10-13 MED ORDER — OXYCODONE-ACETAMINOPHEN 5-325 MG PO TABS
1.0000 | ORAL_TABLET | ORAL | Status: DC | PRN
Start: 2020-10-13 — End: 2020-10-14

## 2020-10-13 MED ORDER — MIDAZOLAM HCL 5 MG/5ML IJ SOLN
INTRAMUSCULAR | Status: DC | PRN
  Administered 2020-10-13: 1 mg via INTRAVENOUS

## 2020-10-13 MED ORDER — PROMETHAZINE HCL 25 MG/ML IJ SOLN: 6.2500 mg | INTRAMUSCULAR | Status: DC | PRN

## 2020-10-13 MED ORDER — ONDANSETRON HCL 4 MG/2ML IJ SOLN: 4.0000 mg | Freq: Four times a day (QID) | INTRAMUSCULAR | Status: DC | PRN

## 2020-10-13 MED ORDER — ONDANSETRON HCL 4 MG PO TABS: 4.0000 mg | ORAL_TABLET | Freq: Four times a day (QID) | ORAL | Status: DC | PRN

## 2020-10-13 MED ORDER — MEPERIDINE HCL 25 MG/ML IJ SOLN: 6.2500 mg | INTRAMUSCULAR | Status: DC | PRN

## 2020-10-13 MED ORDER — ONDANSETRON HCL 4 MG/2ML IJ SOLN
INTRAMUSCULAR | Status: DC | PRN
  Administered 2020-10-13: 4 mg via INTRAVENOUS

## 2020-10-13 MED ORDER — AMISULPRIDE (ANTIEMETIC) 5 MG/2ML IV SOLN: 10.0000 mg | Freq: Once | INTRAVENOUS | Status: DC | PRN

## 2020-10-13 MED ORDER — LIDOCAINE 2% (20 MG/ML) 5 ML SYRINGE
INTRAMUSCULAR | Status: DC | PRN
  Administered 2020-10-13: 40 mg via INTRAVENOUS

## 2020-10-13 MED ORDER — ACETAMINOPHEN 325 MG PO TABS: 650.0000 mg | ORAL_TABLET | Freq: Four times a day (QID) | ORAL | Status: DC | PRN

## 2020-10-13 MED ORDER — VANCOMYCIN HCL 1000 MG IV SOLR
INTRAVENOUS | Status: DC | PRN
  Administered 2020-10-13: 1000 mg

## 2020-10-13 MED ORDER — CYCLOBENZAPRINE HCL 5 MG PO TABS
5.0000 mg | ORAL_TABLET | Freq: Three times a day (TID) | ORAL | Status: DC | PRN
  Administered 2020-10-14: 5 mg via ORAL
  Filled 2020-10-13: qty 1

## 2020-10-13 MED ORDER — THIAMINE HCL 100 MG/ML IJ SOLN: 100.0000 mg | Freq: Every day | INTRAMUSCULAR | Status: DC

## 2020-10-13 MED ORDER — ACETAMINOPHEN 650 MG RE SUPP: 650.0000 mg | Freq: Four times a day (QID) | RECTAL | Status: DC | PRN

## 2020-10-13 MED ORDER — ADULT MULTIVITAMIN W/MINERALS CH: 1.0000 | ORAL_TABLET | Freq: Every day | ORAL | Status: DC

## 2020-10-13 MED ORDER — KETAMINE HCL 50 MG/5ML IJ SOSY
PREFILLED_SYRINGE | INTRAMUSCULAR | Status: AC
  Filled 2020-10-13: qty 5

## 2020-10-13 MED ORDER — POTASSIUM CHLORIDE 2 MEQ/ML IV SOLN
INTRAVENOUS | Status: DC
  Filled 2020-10-13: qty 1000

## 2020-10-13 MED ORDER — INSULIN ASPART 100 UNIT/ML IV SOLN
8.0000 [IU] | Freq: Once | INTRAVENOUS | Status: AC
  Administered 2020-10-13: 8 [IU] via INTRAVENOUS

## 2020-10-13 MED ORDER — INSULIN GLARGINE 100 UNIT/ML ~~LOC~~ SOLN
50.0000 [IU] | Freq: Every day | SUBCUTANEOUS | Status: DC
  Filled 2020-10-13: qty 0.5

## 2020-10-13 MED ORDER — ENOXAPARIN SODIUM 40 MG/0.4ML IJ SOSY
40.0000 mg | PREFILLED_SYRINGE | INTRAMUSCULAR | Status: DC
  Administered 2020-10-14 – 2020-10-15 (×2): 40 mg via SUBCUTANEOUS
  Filled 2020-10-13 (×2): qty 0.4

## 2020-10-13 MED ORDER — CEFAZOLIN SODIUM-DEXTROSE 2-4 GM/100ML-% IV SOLN
INTRAVENOUS | Status: AC
  Filled 2020-10-13: qty 100

## 2020-10-13 MED ORDER — FOLIC ACID 1 MG PO TABS
1.0000 mg | ORAL_TABLET | Freq: Every day | ORAL | Status: DC
  Administered 2020-10-14 – 2020-10-16 (×4): 1 mg via ORAL
  Filled 2020-10-13 (×4): qty 1

## 2020-10-13 MED ORDER — SUCCINYLCHOLINE CHLORIDE 200 MG/10ML IV SOSY
PREFILLED_SYRINGE | INTRAVENOUS | Status: DC | PRN
  Administered 2020-10-13: 140 mg via INTRAVENOUS

## 2020-10-13 MED ORDER — POLYETHYLENE GLYCOL 3350 17 G PO PACK: 17.0000 g | PACK | Freq: Every day | ORAL | Status: DC | PRN

## 2020-10-13 MED ORDER — LORAZEPAM 2 MG/ML IJ SOLN: 1.0000 mg | INTRAMUSCULAR | Status: DC | PRN

## 2020-10-13 MED ORDER — PROPOFOL 10 MG/ML IV BOLUS
INTRAVENOUS | Status: AC
  Filled 2020-10-13: qty 20

## 2020-10-13 MED ORDER — THIAMINE HCL 100 MG PO TABS: 100.0000 mg | ORAL_TABLET | Freq: Every day | ORAL | Status: DC

## 2020-10-13 MED ORDER — ACETAMINOPHEN 10 MG/ML IV SOLN
1000.0000 mg | Freq: Once | INTRAVENOUS | Status: DC | PRN
  Administered 2020-10-13: 1000 mg via INTRAVENOUS

## 2020-10-13 MED ORDER — ACETAMINOPHEN 160 MG/5ML PO SOLN: 325.0000 mg | Freq: Once | ORAL | Status: DC | PRN

## 2020-10-13 MED ORDER — ACETAMINOPHEN 325 MG PO TABS: 325.0000 mg | ORAL_TABLET | Freq: Once | ORAL | Status: DC | PRN

## 2020-10-13 MED ORDER — HYDROMORPHONE HCL 1 MG/ML IJ SOLN: 0.2500 mg | INTRAMUSCULAR | Status: DC | PRN

## 2020-10-13 MED ORDER — VANCOMYCIN HCL 1000 MG IV SOLR
INTRAVENOUS | Status: AC
  Filled 2020-10-13: qty 1000

## 2020-10-13 MED ORDER — VANCOMYCIN HCL 500 MG IV SOLR
INTRAVENOUS | Status: AC
  Filled 2020-10-13: qty 500

## 2020-10-13 MED ORDER — INSULIN ASPART 100 UNIT/ML IJ SOLN
INTRAMUSCULAR | Status: DC | PRN
  Administered 2020-10-13: 10 [IU] via SUBCUTANEOUS

## 2020-10-13 MED ORDER — LACTATED RINGERS IV SOLN: INTRAVENOUS | Status: DC

## 2020-10-13 MED ORDER — ROCURONIUM BROMIDE 10 MG/ML (PF) SYRINGE
PREFILLED_SYRINGE | INTRAVENOUS | Status: DC | PRN
  Administered 2020-10-13: 40 mg via INTRAVENOUS

## 2020-10-13 MED ORDER — PROPOFOL 10 MG/ML IV BOLUS
INTRAVENOUS | Status: DC | PRN
  Administered 2020-10-13: 150 mg via INTRAVENOUS

## 2020-10-13 MED ORDER — INSULIN ASPART 100 UNIT/ML IJ SOLN
0.0000 [IU] | Freq: Three times a day (TID) | INTRAMUSCULAR | Status: DC
  Administered 2020-10-13: 3 [IU] via SUBCUTANEOUS
  Administered 2020-10-14: 15 [IU] via SUBCUTANEOUS
  Administered 2020-10-14: 5 [IU] via SUBCUTANEOUS
  Administered 2020-10-14: 11 [IU] via SUBCUTANEOUS
  Administered 2020-10-14: 5 [IU] via SUBCUTANEOUS
  Administered 2020-10-15: 8 [IU] via SUBCUTANEOUS
  Administered 2020-10-15: 11 [IU] via SUBCUTANEOUS
  Administered 2020-10-15: 15 [IU] via SUBCUTANEOUS
  Administered 2020-10-15: 11 [IU] via SUBCUTANEOUS
  Administered 2020-10-16 (×2): 15 [IU] via SUBCUTANEOUS

## 2020-10-13 MED ORDER — ACETAMINOPHEN 10 MG/ML IV SOLN
INTRAVENOUS | Status: AC
  Filled 2020-10-13: qty 100

## 2020-10-13 MED ORDER — HYDROMORPHONE HCL 1 MG/ML IJ SOLN
0.5000 mg | INTRAMUSCULAR | Status: DC | PRN
  Administered 2020-10-14: 0.5 mg via INTRAVENOUS
  Filled 2020-10-13: qty 0.5

## 2020-10-13 MED ORDER — VANCOMYCIN HCL 1250 MG/250ML IV SOLN
1250.0000 mg | Freq: Three times a day (TID) | INTRAVENOUS | Status: DC
  Administered 2020-10-14 – 2020-10-16 (×8): 1250 mg via INTRAVENOUS
  Filled 2020-10-13 (×10): qty 250

## 2020-10-13 MED ORDER — MELATONIN 5 MG PO TABS: 10.0000 mg | ORAL_TABLET | Freq: Every evening | ORAL | Status: DC | PRN

## 2020-10-13 MED ORDER — LORAZEPAM 1 MG PO TABS: 1.0000 mg | ORAL_TABLET | ORAL | Status: DC | PRN

## 2020-10-13 MED ORDER — INSULIN GLARGINE 100 UNIT/ML ~~LOC~~ SOLN
15.0000 [IU] | Freq: Two times a day (BID) | SUBCUTANEOUS | Status: DC
  Administered 2020-10-14: 15 [IU] via SUBCUTANEOUS
  Filled 2020-10-13 (×2): qty 0.15

## 2020-10-13 MED ORDER — NICOTINE 14 MG/24HR TD PT24
14.0000 mg | MEDICATED_PATCH | Freq: Every day | TRANSDERMAL | Status: DC
  Administered 2020-10-14 – 2020-10-16 (×4): 14 mg via TRANSDERMAL
  Filled 2020-10-13 (×4): qty 1

## 2020-10-13 SURGICAL SUPPLY — 35 items
BNDG ELASTIC 4X5.8 VLCR STR LF (GAUZE/BANDAGES/DRESSINGS) ×2 IMPLANT
BNDG ESMARK 4X9 LF (GAUZE/BANDAGES/DRESSINGS) IMPLANT
BNDG GAUZE ELAST 4 BULKY (GAUZE/BANDAGES/DRESSINGS) ×2 IMPLANT
CHLORAPREP W/TINT 26 (MISCELLANEOUS) IMPLANT
CORD BIPOLAR FORCEPS 12FT (ELECTRODE) ×2 IMPLANT
COVER SURGICAL LIGHT HANDLE (MISCELLANEOUS) ×2 IMPLANT
CUFF TOURN SGL QUICK 18X4 (TOURNIQUET CUFF) ×2 IMPLANT
DRAPE U-SHAPE 47X51 STRL (DRAPES) ×2 IMPLANT
GAUZE SPONGE 4X4 12PLY STRL (GAUZE/BANDAGES/DRESSINGS) ×2 IMPLANT
GAUZE SPONGE 4X4 12PLY STRL LF (GAUZE/BANDAGES/DRESSINGS) ×2 IMPLANT
GAUZE XEROFORM 5X9 LF (GAUZE/BANDAGES/DRESSINGS) ×2 IMPLANT
GLOVE SRG 8 PF TXTR STRL LF DI (GLOVE) ×1 IMPLANT
GLOVE SURG SYN 7.5  E (GLOVE) ×1
GLOVE SURG SYN 7.5 E (GLOVE) ×1 IMPLANT
GLOVE SURG UNDER POLY LF SZ8 (GLOVE) ×1
GOWN STRL REUS W/ TWL LRG LVL3 (GOWN DISPOSABLE) ×1 IMPLANT
GOWN STRL REUS W/TWL LRG LVL3 (GOWN DISPOSABLE) ×1
KIT BASIN OR (CUSTOM PROCEDURE TRAY) ×2 IMPLANT
KIT TURNOVER KIT B (KITS) ×2 IMPLANT
MANIFOLD NEPTUNE II (INSTRUMENTS) ×2 IMPLANT
NS IRRIG 1000ML POUR BTL (IV SOLUTION) ×2 IMPLANT
PACK ORTHO EXTREMITY (CUSTOM PROCEDURE TRAY) ×2 IMPLANT
PAD ABD 7.5X8 STRL (GAUZE/BANDAGES/DRESSINGS) ×2 IMPLANT
PAD ARMBOARD 7.5X6 YLW CONV (MISCELLANEOUS) ×2 IMPLANT
PAD CAST 4YDX4 CTTN HI CHSV (CAST SUPPLIES) ×1 IMPLANT
PADDING CAST COTTON 4X4 STRL (CAST SUPPLIES) ×1
SET CYSTO W/LG BORE CLAMP LF (SET/KITS/TRAYS/PACK) ×2 IMPLANT
SPONGE LAP 4X18 RFD (DISPOSABLE) ×2 IMPLANT
SUT PROLENE 4 0 PS 2 18 (SUTURE) ×2 IMPLANT
SWAB CULTURE ESWAB REG 1ML (MISCELLANEOUS) ×2 IMPLANT
TOWEL GREEN STERILE FF (TOWEL DISPOSABLE) ×2 IMPLANT
TUBE CONNECTING 12X1/4 (SUCTIONS) ×2 IMPLANT
TUBING TUR DISP (UROLOGICAL SUPPLIES) ×2 IMPLANT
UNDERPAD 30X36 HEAVY ABSORB (UNDERPADS AND DIAPERS) ×4 IMPLANT
YANKAUER SUCT BULB TIP NO VENT (SUCTIONS) ×2 IMPLANT

## 2020-10-13 NOTE — Anesthesia Preprocedure Evaluation (Addendum)
Anesthesia Evaluation  Patient identified by MRN, date of birth, ID band Patient awake    Reviewed: Allergy & Precautions, NPO status , Patient's Chart, lab work & pertinent test results  Airway Mallampati: II  TM Distance: >3 FB Neck ROM: Full    Dental  (+) Teeth Intact, Dental Advisory Given   Pulmonary Current Smoker, former smoker,    Pulmonary exam normal        Cardiovascular negative cardio ROS   Rhythm:Regular Rate:Normal     Neuro/Psych    GI/Hepatic   Endo/Other  diabetes, Type 2, Insulin Dependent  Renal/GU      Musculoskeletal   Abdominal Normal abdominal exam  (+)   Peds  Hematology   Anesthesia Other Findings   Reproductive/Obstetrics                         Anesthesia Physical Anesthesia Plan  ASA: II  Anesthesia Plan: General   Post-op Pain Management:    Induction: Intravenous, Rapid sequence and Cricoid pressure planned  PONV Risk Score and Plan: 3 and Ondansetron and Midazolam  Airway Management Planned: Oral ETT  Additional Equipment: None  Intra-op Plan:   Post-operative Plan: Extubation in OR  Informed Consent: I have reviewed the patients History and Physical, chart, labs and discussed the procedure including the risks, benefits and alternatives for the proposed anesthesia with the patient or authorized representative who has indicated his/her understanding and acceptance.     Dental advisory given  Plan Discussed with: CRNA  Anesthesia Plan Comments: (Precedex 0.43mcg/kg )        Anesthesia Quick Evaluation

## 2020-10-13 NOTE — Anesthesia Procedure Notes (Signed)
Procedure Name: Intubation Date/Time: 10/13/2020 7:45 PM Performed by: Edmonia Caprio, CRNA Pre-anesthesia Checklist: Patient identified, Emergency Drugs available, Suction available, Patient being monitored and Timeout performed Patient Re-evaluated:Patient Re-evaluated prior to induction Oxygen Delivery Method: Circle system utilized Preoxygenation: Pre-oxygenation with 100% oxygen Induction Type: IV induction, Rapid sequence and Cricoid Pressure applied Laryngoscope Size: Miller and 2 Grade View: Grade I Tube size: 7.5 mm Number of attempts: 1 Airway Equipment and Method: Stylet Placement Confirmation: ETT inserted through vocal cords under direct vision,  positive ETCO2 and breath sounds checked- equal and bilateral Secured at: 23 cm Tube secured with: Tape Dental Injury: Teeth and Oropharynx as per pre-operative assessment

## 2020-10-13 NOTE — Consult Note (Signed)
ORTHOPAEDIC CONSULTATION  REQUESTING PHYSICIAN: Ernest Mallick, *  PCP:  [name], PA-C  Chief Complaint: Left hand infection  HPI: Keith Riggs is a 30 y.o. male who complains of left hand infection.  Patient reports a 4-day history of a slowly worsening redness and swelling to the dorsal aspect of the left hand.  He denies any injuries to the hand but thinks he may have had a insect or spider bite to the hand which is slowly gotten worse over the past 4 days.  He was initially seen at [facility] where hand surgery was called.  He was found to have a blood glucose level in the 500s as well as significant swelling and erythema around the dorsum of the hand and he was transferred here to [facility] for admission under the hospitalist service and continued hand care.  He states his pain is been continued and he denies numbness or tingling of the fingers.  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  He presents for further treatment of his left hand.  Past Medical History:  Diagnosis Date  . Abscess   . Diabetes mellitus without complication [facility])    Past Surgical History:  Procedure Laterality Date  . DENTAL SURGERY    . TONSILLECTOMY     Social History   Socioeconomic History  . Marital status: Single    Spouse name: Not on file  . Number of children: Not on file  . Years of education: Not on file  . Highest education level: Not on file  Occupational History  . Not on file  Tobacco Use  . Smoking status: Former Smoker    Packs/day: 0.50    Years: 2.00    Pack years: 1.00    Types: Cigarettes  . Smokeless tobacco: Current User    Types: Snuff  . Tobacco comment: Began 2006  Substance and Sexual Activity  . Alcohol use: No  . Drug use: No    Types: IV, Heroin, Cocaine    Comment: none per patient none since 2016  . Sexual activity: Yes    Comment: Tatoo Artist  Other Topics Concern  . Not on file  Social History  Narrative  . Not on file   Social Determinants of Health   Financial Resource Strain: Not on file  Food Insecurity: Not on file  Transportation Needs: Not on file  Physical Activity: Not on file  Stress: Not on file  Social Connections: Not on file   Family History  Problem Relation Age of Onset  . Diabetes Father   . Heart disease Father   . Diabetes Maternal Grandmother   . Miscarriages / Stillbirths Maternal Grandmother   . Cancer Neg Hx    Allergies  Allergen Reactions  . Bee Venom Anaphylaxis  . Voltaren [Diclofenac Sodium] Other (See Comments)    "Makes me feel not myself"  . Latex Rash   Prior to Admission medications   Medication Sig Start Date End Date Taking? Authorizing Provider  cyclobenzaprine (FLEXERIL) 5 MG tablet Take 1 tablet (5 mg total) by mouth 3 (three) times daily as needed for muscle spasms. 01/06/17   Albertine Grates, MD  doxycycline (VIBRA-TABS) 100 MG tablet Take 1 tablet (100 mg total) by mouth every 12 (twelve) hours. 03/30/17   Comer, Belia Heman, MD  HYDROcodone-acetaminophen (NORCO/VICODIN) 5-325 MG tablet Take 1 tablet by mouth every 4 (four) hours as needed. for pain 01/21/17  Trudie Reed, Arlo C, PA-C  insulin aspart (NOVOLOG) 100 UNIT/ML injection Before each meal 3 times a day, 140-199 - 2 units, 200-250 - 4 units, 251-299 - 6 units,  300-349 - 8 units,  350 or above 10 units. Insulin PEN if approved, provide syringes and needles if needed. 01/06/17   Albertine Grates, MD  Insulin Glargine (TOUJEO MAX SOLOSTAR) 300 UNIT/ML SOPN Inject 50 Units into the skin daily. 01/06/17   Albertine Grates, MD  metFORMIN (GLUCOPHAGE) 500 MG tablet Take 500 mg by mouth 2 (two) times daily with a meal.    [provider]  polyethylene glycol (MIRALAX / GLYCOLAX) packet Take 17 g by mouth daily. 01/07/17   Albertine Grates, MD  senna-docusate (SENOKOT-S) 8.6-50 MG tablet Take 2 tablets by mouth at bedtime. 01/06/17   Albertine Grates, MD   No results found.  Positive ROS: All other systems have been  reviewed and were otherwise negative with the exception of those mentioned in the HPI and as above.  Physical Exam: General: Alert, no acute distress Cardiovascular: No edema Respiratory: No cyanosis, no use of accessory musculature Skin: Significant erythema and redness to the dorsum of the left hand Psychiatric: Patient is competent for consent with normal mood and affect  MUSCULOSKELETAL: Significantly swollen and erythematous dorsal left hand.  There is oozing drainage from multiple small punctate wounds on the dorsum of the hand.  Significant tenderness to palpation and fluctuance around in the dorsum of the hand.  Intact though decreased digit and wrist major motion secondary to pain.  Fingertips are all warm and well-perfused with brisk capillary refill.  Intact sensation throughout the digits.  Assessment: Left dorsal hand abscess  Plan: Plan for irrigation and excisional debridement of the left dorsal hand and surgery as indicated  The risk, benefits and alternatives of the procedure were discussed with the patient.  These risks include but are not limited to infection, bleeding, damage to surrounding structures including blood vessels and nerves, pain, stiffness, recurrence and need for additional procedures.  Informed consent was obtained the patient's left arm was marked.  Plan for admission under the hospitalist service for continued inpatient care for IV antibiotics and medical management of his diabetes.    Ernest Mallick, MD [phone]   10/13/2020 7:20 PM

## 2020-10-13 NOTE — Progress Notes (Signed)
Pt diaphoretic during transfer to the floor. Denies withdrawal symptoms or use. Pt encouraged to let us know if he experiences withdrawal and states he is just hot. Jasmine December RN at bedside on 5N aware and to recheck pt CBG following this RN giving insulin in PACU.

## 2020-10-13 NOTE — Progress Notes (Signed)
Pt calm and sleeping in PACU. Remained NPO in PACU and will stay NPO on 5N per Admitting provider for liver ultrasound tonight. Relayed to [employer] who is assuming care on 5N.

## 2020-10-13 NOTE — Op Note (Addendum)
PREOPERATIVE DIAGNOSIS: Left dorsal hand abscess  POSTOPERATIVE DIAGNOSIS: Same  ATTENDING PHYSICIAN: Gasper Lloyd. [name], III, MD who was present and scrubbed for the entire case   ASSISTANT SURGEON: None.   ANESTHESIA: General  SURGICAL PROCEDURES:  1. Irrigation and excisional debridement of dorsal left hand abscess 2.  Extensor tenosynovectomy to the tendons within the second, third and fourth dorsal compartments  SURGICAL INDICATIONS: Patient is a 30 year old male who noted a 4-day history of slowly enlarging dorsal hand abscess with associated erythema and drainage.  He denies any injury and states he thinks this was from a bug bite on the dorsum of the hand.  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  He was initially seen at [facility] where they were unable to care for his hand so he was transferred to [facility] and admitted to the hospital service.  Due to the severity of his left hand infection, I did recommend proceeding forward with an urgent irrigation and debridement of the left hand.  FINDING: Abundant gross pus throughout the dorsum of the hand which was cultured for both aerobic and anaerobic bacteria.  Thickened, infectious type tenosynovium around the extensor tendons to the second, third, fourth dorsal compartments.  DESCRIPTION OF PROCEDURE: Patient was identified in the preop holding area where the risk benefits and alternatives of the procedure were discussed with the patient.  These risks include but are not limited to infection, bleeding, damage to surrounding structures including blood vessels and nerves, pain, stiffness, recurrence and need for additional procedures.  Informed consent was obtained that time the patient's left hand was marked.  He was then brought back to the operative suite where timeout was performed identifying the correct patient operative site.  He was positioned supine on the operative table with his hand outstretched on a hand table.  He was  induced under general anesthesia.  Preoperative antibiotics were administered.  A tourniquet is placed on the upper arm.  The left upper extremity was prepped using a Betadine paint and scrub and draped in usual sterile fashion.  The limb was exsanguinated by gravity and the tourniquet was inflated.  A longitudinal incision was made over the dorsal aspect of the hand and wrist.  And incising the skin, there was a large blush of abundant purulent fluid.  This was cultured for both aerobic and anaerobic bacteria.  Full-thickness skin flaps were elevated radially and ulnarly to expose the extensor tendons and dorsum of the hand.  There was thick infectious appearing tenosynovium around the tendons of the second, third and fourth dorsal compartments.  Thorough debridement surrounding the dorsum of the hand was then performed with scissors and a rondure.  All infectious and necrotic appearing material was removed throughout the dorsum of the hand including careful extensor tenosynovectomy to the tendons of the second, third and fourth dorsal compartments.  At the completion of a thorough debridement, the wound was copiously irrigated with 3 L of normal saline via cystoscopy tubing.  The tendons to the second, third and fourth dorsal compartments were fully intact at the completion of the debridement with intact tenodesis effect throughout all digits.  1 g of vancomycin powder was then placed into the wound and the incision was loosely closed over top the Penrose drain.  Xeroform, 4 x 4's and a bulky, well-padded soft dressing was applied to the hand and wrist.  The tourniquet was released and the patient had return of brisk capillary refill to all of his digits.  He was awoken from his anesthesia and taken the PACU in stable condition.  He tolerated the procedure well there were no complications.  ESTIMATED BLOOD LOSS: 20 mL  TOURNIQUET TIME: Approximately 30 minutes  SPECIMENS: Aerobic and anaerobic cultures  x1  POSTOPERATIVE PLAN: Patient will be admitted to the hospitalist service for continued inpatient care.  We will monitor his cultures and wound over the next several days.  We will have PT hydrotherapy see the patient while inpatient to continue with local wound care and debridement.  IMPLANTS: None  Debridement type: Excisional Debridement  Side: left  Body Location: hand   Tools used for debridement: scissors and rongeur  Debridement depth beyond dead/damaged tissue down to healthy viable tissue: yes  Tissue layer involved: skin, subcutaneous tissue, muscle / fascia  Nature of tissue removed: Purulence  Irrigation volume: 3L     Irrigation fluid type: Normal Saline

## 2020-10-13 NOTE — Transfer of Care (Signed)
Immediate Anesthesia Transfer of Care Note  Patient: Keith Riggs  Procedure(s) Performed: IRRIGATION AND DEBRIDEMENT LEFT DORSAL HAND WITH TENSOR SYNOVECTOMY (Left Hand)  Patient Location: PACU  Anesthesia Type:General  Level of Consciousness: sedated and responds to stimulation  Airway & Oxygen Therapy: Patient Spontanous Breathing and Patient connected to face mask oxygen  Post-op Assessment: Report given to RN and Post -op Vital signs reviewed and stable  Post vital signs: Reviewed and stable  Last Vitals:  Vitals Value Taken Time  BP    Temp    Pulse    Resp    SpO2      Last Pain: There were no vitals filed for this visit.       Complications: No complications documented.

## 2020-10-13 NOTE — H&P (Signed)
History and Physical    Keith Riggs NWG:[id number] DOB: 08/12/1990 DOA: 10/13/2020  PCP: [name], PA-C  Patient coming from: [name]   Chief Complaint: Left hand pain/swelling   HPI:    30 year old male with past medical history of ongoing intravenous drug abuse (Heroin), nicotine dependence, depression, insulin-dependent diabetes mellitus type 2 and history of extensive MRSA infection in the past in 2018 including sepsis, osteomyelitis, epidural abscess and inguinal abscesses who presents to [facility] as a transfer from [facility] due to left hand pain and swelling.  Patient explains approximately 4 days ago he began to experience swelling on the dorsum of his left hand.  While patient does admit to ongoing intravenous drug abuse, he states that he has not injected there as of late he reports that he was doing yard work several days prior to the onset of his symptoms when he "got a bug bite" in that area.  Patient's associated pain rapidly increased and became severe in intensity.  Patient describes the pain as sharp and throbbing in quality, radiating proximally and worse with movement of the affected extremity.  Patient states that as a result he has had difficulty using the hand but has retained sensation.  Denies any associated generalized weakness or fevers.  Due to ongoing swelling pain and redness of the hand patient eventually presented to [facility] emergency department for evaluation.  Upon evaluation in the emergency department patient was found to have extensive, near "baseball sized" swelling on the dorsum of the left hand.  X-ray was performed of the area which revealed no evidence of osteomyelitis.  [facility] physicians discussed case with Dr. [name] with Ortho hand who agreed to operatively intervene upon arrival to [facility].  Case additionally discussed with Dr. Jacqulyn Bath with [facility] and patient has additionally been accepted to our  service as primary.  Patient underwent irrigation and excisional debridement of the dorsal left hand abscess with extensor tenosynovectomy of multiple tendons on arrival.  There have been no immediate complications and patient is now being admitted to [facility] hospital service.  Review of Systems:   Review of Systems  Constitutional: Positive for malaise/fatigue.  Musculoskeletal:       Hand pain / swelling  All other systems reviewed and are negative.   Past Medical History:  Diagnosis Date  . Abscess   . 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 Diabetes mellitus without complication (HCC)   . History of MRSA infection 10/13/2020  . 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 Polysubstance abuse (HCC) 12/20/2016  . Sepsis due to methicillin resistant Staphylococcus aureus (MRSA) (HCC) 12/22/2016  . Soft tissue abscess of inguinal region 12/20/2016    Past Surgical History:  Procedure Laterality Date  . DENTAL SURGERY    . TONSILLECTOMY       reports that he has been smoking cigarettes. He has been smoking about 0.50 packs per day. His smokeless tobacco use includes snuff. He reports that he does not drink alcohol and does not use drugs.  Allergies  Allergen Reactions  . Bee Venom Anaphylaxis  . Voltaren [Diclofenac Sodium] Other (See Comments)    "Makes me feel not myself"  . Latex Rash    Family History  Problem Relation Age of Onset  . Diabetes Father   . Heart disease Father   . Diabetes Maternal Grandmother   . Miscarriages / Stillbirths Maternal Grandmother   .  Cancer Neg Hx      Prior to Admission medications   Medication Sig Start Date End Date Taking? Authorizing Provider  cyclobenzaprine (FLEXERIL) 5 MG tablet Take 1 tablet (5 mg total) by mouth 3 (three) times daily as needed for muscle spasms. 01/06/17   Albertine Grates, MD  doxycycline (VIBRA-TABS) 100 MG tablet Take 1 tablet (100 mg total)  by mouth every 12 (twelve) hours. 03/30/17   Comer, Belia Heman, MD  HYDROcodone-acetaminophen (NORCO/VICODIN) 5-325 MG tablet Take 1 tablet by mouth every 4 (four) hours as needed. for pain 01/21/17   Edmon Crape C, PA-C  insulin aspart (NOVOLOG) 100 UNIT/ML injection Before each meal 3 times a day, 140-199 - 2 units, 200-250 - 4 units, 251-299 - 6 units,  300-349 - 8 units,  350 or above 10 units. Insulin PEN if approved, provide syringes and needles if needed. 01/06/17   Albertine Grates, MD  Insulin Glargine (TOUJEO MAX SOLOSTAR) 300 UNIT/ML SOPN Inject 50 Units into the skin daily. 01/06/17   Albertine Grates, MD  metFORMIN (GLUCOPHAGE) 500 MG tablet Take 500 mg by mouth 2 (two) times daily with a meal.    [provider]  polyethylene glycol (MIRALAX / GLYCOLAX) packet Take 17 g by mouth daily. 01/07/17   Albertine Grates, MD  senna-docusate (SENOKOT-S) 8.6-50 MG tablet Take 2 tablets by mouth at bedtime. 01/06/17   Albertine Grates, MD    Physical Exam: Vitals:   10/13/20 2115 10/13/20 2130 10/13/20 2150 10/13/20 2224  BP: (!) 142/94 (!) 142/91 (!) 143/88 138/82  Pulse: 94 90 95 86  Resp: 19 20 16 16   Temp:   (!) 97.1 F (36.2 C) 98.2 F (36.8 C)  TempSrc:    Oral  SpO2: 100% 96% 96% 97%  Weight:      Height:        Constitutional: Lethargic arousable and oriented x3, some distress due to pain in the left hand. Skin: no rashes, no lesions, good skin turgor noted. Eyes: Pupils are equally reactive to light.  No evidence of scleral icterus or conjunctival pallor.  ENMT: Moist mucous membranes noted.  Posterior pharynx clear of any exudate or lesions.   Neck: normal, supple, no masses, no thyromegaly.  No evidence of jugular venous distension.   Respiratory: clear to auscultation bilaterally, no wheezing, no crackles. Normal respiratory effort. No accessory muscle use.  Cardiovascular: Regular rate and rhythm, no murmurs / rubs / gallops.  Left hand edema.  2+ pedal pulses. No carotid bruits.  Evidence of good  capillary refill of the digits of the left hand. Chest:   Nontender without crepitus or deformity.   Back:   Nontender without crepitus or deformity. Abdomen: Abdomen is soft and nontender.  No evidence of intra-abdominal masses.  Positive bowel sounds noted in all quadrants.   Musculoskeletal: Left hand dressing is clean dry and intact.  Notable associated edema of the left hand.  No joint deformity upper and lower extremities.  Limited range of motion of the left hand due to swelling and dressing.  No contractures. Normal muscle tone.  Neurologic: Patient is lethargic but arousable and oriented x3, sensation of left hand is intact.  CN 2-12 grossly intact.Patient moving all 4 extremities spontaneously.  Patient is following all commands.  Patient is responsive to verbal stimuli.   Psychiatric: Patient exhibits normal mood with appropriate affect.  Patient seems to possess insight as to their current situation.     Labs on Admission: I have personally reviewed  following labs and imaging studies -   CBC: No results for input(s): WBC, NEUTROABS, HGB, HCT, MCV, PLT in the last 168 hours. Basic Metabolic Panel: No results for input(s): NA, K, CL, CO2, GLUCOSE, BUN, CREATININE, CALCIUM, MG, PHOS in the last 168 hours. GFR: CrCl cannot be calculated (Patient's most recent lab result is older than the maximum 21 days allowed.). Liver Function Tests: No results for input(s): AST, ALT, ALKPHOS, BILITOT, PROT, ALBUMIN in the last 168 hours. No results for input(s): LIPASE, AMYLASE in the last 168 hours. No results for input(s): AMMONIA in the last 168 hours. Coagulation Profile: No results for input(s): INR, PROTIME in the last 168 hours. Cardiac Enzymes: No results for input(s): CKTOTAL, CKMB, CKMBINDEX, TROPONINI in the last 168 hours. BNP (last 3 results) No results for input(s): PROBNP in the last 8760 hours. HbA1C: No results for input(s): HGBA1C in the last 72 hours. CBG: Recent Labs  Lab  10/13/20 1907 10/13/20 2049 10/13/20 2228  GLUCAP 314* 251* 187*   Lipid Profile: No results for input(s): CHOL, HDL, LDLCALC, TRIG, CHOLHDL, LDLDIRECT in the last 72 hours. Thyroid Function Tests: No results for input(s): TSH, T4TOTAL, FREET4, T3FREE, THYROIDAB in the last 72 hours. Anemia Panel: No results for input(s): VITAMINB12, FOLATE, FERRITIN, TIBC, IRON, RETICCTPCT in the last 72 hours. Urine analysis: No results found for: COLORURINE, APPEARANCEUR, LABSPEC, PHURINE, GLUCOSEU, HGBUR, BILIRUBINUR, KETONESUR, PROTEINUR, UROBILINOGEN, NITRITE, LEUKOCYTESUR  Radiological Exams on Admission - Personally Reviewed: No results found.   Assessment/Plan Principal Problem:   Abscess of left hand   Patient presenting with extensive pain and abscess formation of the dorsum of the left hand prompting transfer to [facility] from [facility] emergency department for operative debridement and extensor tenosynovectomy of multiple tendons by Dr. 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    Nicotine dependence, cigarettes, uncomplicated   Counseling daily on cessation  Providing patient with nicotine replacement therapy    Uncontrolled type 2 diabetes mellitus with hyperglycemia, with long-term current use of insulin (HCC)   Patient presenting with substantial hyperglycemia without  DKA  Patient reports taking 15 units of 70/30 insulin in the outpatient setting 3 times daily  Due to tenuous oral intake postoperatively, we will instead place patient on a reduced regimen of 15 units of Lantus twice daily for now to minimize bouts of hypoglycemia  Accu-Cheks before every meal and nightly with sliding scale insulin  Hemoglobin A1c pending    Hyponatremia   Reviewed records from 2018 report the patient was 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  Sodium 128 at [facility] emergency department  That being said, patient currently is volume depleted and therefore we are hydrating patient with a short course of intravenous isotonic fluids  Monitoring sodium levels with serial chemistries    Abnormal liver enzymes   Incidental findings of transaminitis  With ALT higher than AST in an approximate 2:1 ratio  Etiology unclear, possibly drug or infection  related  Hepatitis panel ordered  Right upper quadrant ultrasound ordered    History of MRSA infection   Notable history of MRSA thoracic osteomyelitis, epidural abscess and multiple inguinal abscesses in 2018   Code Status:  Full code Family Communication: deferred   Status is: Inpatient  Remains inpatient appropriate because:Ongoing diagnostic testing needed not appropriate for outpatient work up, IV treatments appropriate due to intensity of illness or inability to take PO and Inpatient level of care appropriate due to severity of illness   Dispo: The patient is from: Home              Anticipated d/c is to: Home              Patient currently is not medically stable to d/c.   Difficult to place patient No        Marinda ElkGeorge J Cove Haydon MD [facility] Pager [phone]  If 7PM-7AM, please contact night-coverage [email or web address] Use universal [place] password for that web site. If you do not have the password, please call the hospital operator.  10/13/2020, 10:55 PM

## 2020-10-13 NOTE — Progress Notes (Signed)
Pharmacy Antibiotic Note  Keith Riggs is a 30 y.o. male admitted on 10/13/2020 with L hand abscess s/p I&D. Pharmacy has been consulted for vancomycin dosing. Vancomycin 1500mg  given ~1500 at OSH ED and Cr ~0.8 mg/dl.  Plan: Vancomycin 1250mg  IV q8h - est AUC 471 Follow Cr, cultures, LOT Vancomycin level at [facility]  Height: 5\' 11"  (180.3 cm) Weight: 90.3 kg (199 lb 1.2 oz) IBW/kg (Calculated) : 75.3  Temp (24hrs), Avg:97.7 F (36.5 C), Min:97.7 F (36.5 C), Max:97.7 F (36.5 C)  No results for input(s): WBC, CREATININE, LATICACIDVEN, VANCOTROUGH, VANCOPEAK, VANCORANDOM, GENTTROUGH, GENTPEAK, GENTRANDOM, TOBRATROUGH, TOBRAPEAK, TOBRARND, AMIKACINPEAK, AMIKACINTROU, AMIKACIN in the last 168 hours.  CrCl cannot be calculated (Patient's most recent lab result is older than the maximum 21 days allowed.).    Allergies  Allergen Reactions  . Bee Venom Anaphylaxis  . Voltaren [Diclofenac Sodium] Other (See Comments)    "Makes me feel not myself"  . Latex Rash    Antimicrobials this admission: Vancomycin 5/7 >>    Thank you for allowing pharmacy to be a part of this patient's care.  , PharmD, BCPS, [facility] Clinical Pharmacist [phone] Please check AMION for all [facility] Pharmacy numbers 10/13/2020

## 2020-10-14 ENCOUNTER — Encounter (HOSPITAL_COMMUNITY): Payer: Self-pay | Admitting: Orthopaedic Surgery

## 2020-10-14 LAB — COMPREHENSIVE METABOLIC PANEL
ALT: 137 U/L — ABNORMAL HIGH (ref 0–44)
AST: 93 U/L — ABNORMAL HIGH (ref 15–41)
Albumin: 2.4 g/dL — ABNORMAL LOW (ref 3.5–5.0)
Alkaline Phosphatase: 130 U/L — ABNORMAL HIGH (ref 38–126)
Anion gap: 9 (ref 5–15)
BUN: <5 mg/dL — ABNORMAL LOW (ref 6–20)
CO2: 25 mmol/L (ref 22–32)
Calcium: 8.3 mg/dL — ABNORMAL LOW (ref 8.9–10.3)
Chloride: 100 mmol/L (ref 98–111)
Creatinine, Ser: 0.47 mg/dL — ABNORMAL LOW (ref 0.61–1.24)
GFR, Estimated: >60 mL/min (ref >60)
Glucose, Bld: 199 mg/dL — ABNORMAL HIGH (ref 70–99)
Potassium: 3.7 mmol/L (ref 3.5–5.1)
Sodium: 134 mmol/L — ABNORMAL LOW (ref 135–145)
Total Bilirubin: 0.8 mg/dL (ref 0.3–1.2)
Total Protein: 6.2 g/dL — ABNORMAL LOW (ref 6.5–8.1)

## 2020-10-14 LAB — HEPATITIS PANEL, ACUTE
HCV Ab: REACTIVE — AB
Hep A IgM: NONREACTIVE
Hep B C IgM: NONREACTIVE
Hepatitis B Surface Ag: NONREACTIVE

## 2020-10-14 LAB — CBC
HCT: 41.2 % (ref 39.0–52.0)
Hemoglobin: 14.1 g/dL (ref 13.0–17.0)
MCH: 30 pg (ref 26.0–34.0)
MCHC: 34.2 g/dL (ref 30.0–36.0)
MCV: 87.7 fL (ref 80.0–100.0)
Platelets: 329 10*3/uL (ref 150–400)
RBC: 4.7 MIL/uL (ref 4.22–5.81)
RDW: 13.1 % (ref 11.5–15.5)
WBC: 12.3 10*3/uL — ABNORMAL HIGH (ref 4.0–10.5)
nRBC: 0 % (ref 0.0–0.2)

## 2020-10-14 LAB — HIV ANTIBODY (ROUTINE TESTING W REFLEX): HIV Screen 4th Generation wRfx: NONREACTIVE

## 2020-10-14 LAB — GLUCOSE, CAPILLARY
Glucose-Capillary: 249 mg/dL — ABNORMAL HIGH (ref 70–99)
Glucose-Capillary: 225 mg/dL — ABNORMAL HIGH (ref 70–99)
Glucose-Capillary: 491 mg/dL — ABNORMAL HIGH (ref 70–99)
Glucose-Capillary: 343 mg/dL — ABNORMAL HIGH (ref 70–99)

## 2020-10-14 MED ORDER — HYDROMORPHONE HCL 1 MG/ML IJ SOLN
1.0000 mg | INTRAMUSCULAR | Status: DC | PRN
  Administered 2020-10-14 – 2020-10-16 (×9): 1 mg via INTRAVENOUS
  Filled 2020-10-14 (×10): qty 1

## 2020-10-14 MED ORDER — OXYCODONE HCL 5 MG PO TABS
5.0000 mg | ORAL_TABLET | ORAL | Status: DC | PRN
  Administered 2020-10-14 – 2020-10-16 (×6): 10 mg via ORAL
  Filled 2020-10-14 (×6): qty 2

## 2020-10-14 MED ORDER — HYDROMORPHONE HCL 1 MG/ML IJ SOLN
0.5000 mg | INTRAMUSCULAR | Status: DC | PRN
  Administered 2020-10-14: 0.5 mg via INTRAVENOUS
  Filled 2020-10-14: qty 0.5

## 2020-10-14 MED ORDER — ACETAMINOPHEN 500 MG PO TABS
1000.0000 mg | ORAL_TABLET | Freq: Three times a day (TID) | ORAL | Status: DC
  Administered 2020-10-14 – 2020-10-16 (×6): 1000 mg via ORAL
  Filled 2020-10-14 (×7): qty 2

## 2020-10-14 MED ORDER — ACETAMINOPHEN 650 MG RE SUPP: 650.0000 mg | Freq: Three times a day (TID) | RECTAL | Status: DC

## 2020-10-14 MED ORDER — INSULIN GLARGINE 100 UNIT/ML ~~LOC~~ SOLN
20.0000 [IU] | Freq: Two times a day (BID) | SUBCUTANEOUS | Status: DC
  Administered 2020-10-14 – 2020-10-15 (×2): 20 [IU] via SUBCUTANEOUS
  Filled 2020-10-14 (×4): qty 0.2

## 2020-10-14 NOTE — Progress Notes (Signed)
Covid test done on 10/13/2020 at [facility].  Result is negative.  Information in patient's paper chart.

## 2020-10-14 NOTE — Progress Notes (Signed)
PROGRESS NOTE    Keith Riggs   ELF:[id number]  DOB: Dec 21, 1990  PCP: [name], PA-C    DOA: 10/13/2020 LOS: 1   Brief Narrative   30 year old male with past medical history of ongoing IVDU (heroin), nicotine dependence, depression, insulin-dependent type 2 diabetes, complicated MRSA infection in 2018 including sepsis, osteomyelitis, epidural abscess and inguinal abscesses.    Admitted to [facility] as transfer from [facility] due to left hand pain and swelling due to a large abscess.  Orthopedics took patient to the OR  on evening of admission and performed irrigation and debridement of the abscess in addition to extensor tenosynovectomy of the tendons of the 2nd 3rd and 4th dorsal compartments.  Patient is on IV Vancomycin empirically, given prior severe MRSA infections.  5/8 pt having uncontrolled, severe pain in the left hand.    Assessment & Plan   Principal Problem:   Abscess of left hand Active Problems:   IVDU (intravenous drug user)   Nicotine dependence, cigarettes, uncomplicated   Uncontrolled type 2 diabetes mellitus with hyperglycemia, with long-term current use of insulin (HCC)   Hyponatremia   Abnormal liver enzymes   History of MRSA infection   Abscess of dorsal left hand -status post surgical I&D and extensor tenosynovectomy on 5/7. --Orthopedic surgery following, see their recs -- Continue IV vancomycin --Follow cultures -- Pain control per orders, adjust for adequate pain control -- PT OT  Hyponatremia -presented with sodium 128 in the ED.  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 Sodium level normalized with IV hydration. -- Stop IV fluids --Monitor BMP -- Encourage oral intake and hydration  Transaminitis --incidental finding on labs with ALT higher than AST and approximal 2:1 ratio.  Unclear etiology suspect related to infection and/or drug/alcohol related. -- Hep C antibody  positive.  Negative for hep A or B. 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  Covered for MRSA as above.   Insulin-dependent type 2 diabetes with hyperglycemia -uncontrolled with last A1c 14.3%. Reports taking 15 units of 70/30 insulin 3 times daily. Continue current regimen with Lantus twice daily and sliding scale NovoLog.   IV drug use -TOC consulted to provide patient with community resources to assist with quitting.   Nicotine dependence -counseled regarding the importance of cessation.  Nicotine patch ordered.  Patient BMI: Body mass index is 27.77 kg/m.   DVT prophylaxis: enoxaparin (LOVENOX) injection 40 mg Start: 10/14/20 1600   Diet:  Diet Orders (From admission, onward)    Start     Ordered   10/14/20 0053  Diet Carb Modified Fluid consistency: Thin; Room service appropriate? Yes  Diet effective now       Question Answer Comment  Diet-HS Snack? Nothing   Calorie Level Medium 1600-2000   Fluid consistency: Thin   Room service appropriate? Yes      10/14/20 0052            Code Status: Full Code    Subjective 10/14/20    Patient appears tearful and suffering with pain.  He is unable to get comfortable.  Reports eating and drinking okay today.  Denies fevers or chills or other acute complaints other than pain.   Disposition Plan & Communication   Status is: Inpatient  Inpatient status remains appropriate due to severity  of illness, cultures pending and on empiric IV antibiotics.  Dispo: The patient is from: Home              Anticipated d/c is to: Home              Patient currently not medically stable for discharge   Difficult to place patient no   Consults, Procedures, Significant Events   Consultants:   Orthopedic surgery  Procedures:   5/7 -surgical I&D with extensor tenosynovectomy  of the left hand abscess  Antimicrobials:  Anti-infectives (From admission, onward)   Start     Dose/Rate Route Frequency Ordered Stop   10/13/20 2300  vancomycin (VANCOREADY) IVPB 1250 mg/250 mL        1,250 mg 166.7 mL/hr over 90 Minutes Intravenous Every 8 hours 10/13/20 2126     10/13/20 2015  vancomycin (VANCOCIN) powder  Status:  Discontinued          As needed 10/13/20 2100 10/13/20 2100   10/13/20 1917  ceFAZolin (ANCEF) 2-4 GM/100ML-% IVPB       Note to Pharmacy: Joneen CarawayAuston, Amanda   : cabinet override      10/13/20 1917 10/14/20 0729        Micro    Objective   Vitals:   10/13/20 2130 10/13/20 2150 10/13/20 2224 10/14/20 0437  BP: (!) 142/91 (!) 143/88 138/82 (!) 144/89  Pulse: 90 95 86 88  Resp: 20 16 16 18   Temp:  (!) 97.1 F (36.2 C) 98.2 F (36.8 C) (!) 97.4 F (36.3 C)  TempSrc:   Oral Oral  SpO2: 96% 96% 97% 98%  Weight:      Height:        Intake/Output Summary (Last 24 hours) at 10/14/2020 1407 Last data filed at 10/14/2020 0144 Gross per 24 hour  Intake 700 ml  Output 2020 ml  Net -1320 ml   Filed Weights   10/13/20 2100  Weight: 90.3 kg    Physical Exam:  General exam: awake, alert, appears in distress due to pain HEENT: Face is flushed, clear conjunctiva, anicteric sclera, moist mucus membranes, hearing grossly normal  Respiratory system: normal respiratory effort, on room air. Cardiovascular system: normal S1/S2, RRR, no pedal edema.   Central nervous system: A&O x3. no gross focal neurologic deficits, normal speech Extremities: left hand in ace wrap, normal tone, moves all  Psychiatry: normal mood, congruent affect, judgement and insight appear normal  Labs   Data Reviewed: I have personally reviewed following labs and imaging studies  CBC: Recent Labs  Lab 10/13/20 2253 10/14/20 0209  WBC 12.1* 12.3*  HGB 13.1 14.1  HCT 38.5* 41.2  MCV 87.3 87.7  PLT 304 329   Basic Metabolic Panel: Recent Labs  Lab 10/13/20 2253  10/14/20 0209  NA 134* 134*  K 3.8 3.7  CL 104 100  CO2 23 25  GLUCOSE 187* 199*  BUN <5* <5*  CREATININE 0.40* 0.47*  CALCIUM 8.1* 8.3*  MG 1.6*  --   PHOS 3.0  --    GFR: Estimated Creatinine Clearance: 142.5 mL/min (A) (by C-G formula based on SCr of 0.47 mg/dL (L)). Liver Function Tests: Recent Labs  Lab 10/13/20 2253 10/14/20 0209  AST 82* 93*  ALT 137* 137*  ALKPHOS 127* 130*  BILITOT 0.7 0.8  PROT 6.1* 6.2*  ALBUMIN 2.4* 2.4*   No results for input(s): LIPASE, AMYLASE in the last 168 hours. No results for input(s): AMMONIA in the last 168 hours. Coagulation Profile:  Recent Labs  Lab 10/13/20 2253  INR 1.1   Cardiac Enzymes: No results for input(s): CKTOTAL, CKMB, CKMBINDEX, TROPONINI in the last 168 hours. BNP (last 3 results) No results for input(s): PROBNP in the last 8760 hours. HbA1C: Recent Labs    10/13/20 2253  HGBA1C 14.3*   CBG: Recent Labs  Lab 10/13/20 1907 10/13/20 2049 10/13/20 2228 10/14/20 0643 10/14/20 1142  GLUCAP 314* 251* 187* 249* 225*   Lipid Profile: No results for input(s): CHOL, HDL, LDLCALC, TRIG, CHOLHDL, LDLDIRECT in the last 72 hours. Thyroid Function Tests: No results for input(s): TSH, T4TOTAL, FREET4, T3FREE, THYROIDAB in the last 72 hours. Anemia Panel: No results for input(s): VITAMINB12, FOLATE, FERRITIN, TIBC, IRON, RETICCTPCT in the last 72 hours. Sepsis Labs: No results for input(s): PROCALCITON, LATICACIDVEN in the last 168 hours.  Recent Results (from the past 240 hour(s))  Aerobic/Anaerobic Culture w Gram Stain (surgical/deep wound)     Status: None (Preliminary result)   Collection Time: 10/13/20  8:20 PM   Specimen: Abscess  Result Value Ref Range Status   Specimen Description ABSCESS LEFT HAND  Final   Special Requests SWAB SPEC A VANCO ANCEF  Final   Gram Stain   Final    ABUNDANT WBC PRESENT, PREDOMINANTLY PMN MODERATE GRAM POSITIVE COCCI IN CLUSTERS    Culture   Final    CULTURE  REINCUBATED FOR BETTER GROWTH Performed at [facility], [address], [place], [place] [address]    Report Status PENDING  Incomplete  Culture, blood (routine x 2)     Status: None (Preliminary result)   Collection Time: 10/13/20 10:53 PM   Specimen: BLOOD  Result Value Ref Range Status   Specimen Description BLOOD RIGHT ANTECUBITAL  Final   Special Requests   Final    BOTTLES DRAWN AEROBIC ONLY Blood Culture results may not be optimal due to an inadequate volume of blood received in culture bottles   Culture   Final    NO GROWTH < 12 HOURS Performed at [facility], [address] [place], [place] [address]    Report Status PENDING  Incomplete  Culture, blood (routine x 2)     Status: None (Preliminary result)   Collection Time: 10/13/20 10:53 PM   Specimen: BLOOD  Result Value Ref Range Status   Specimen Description BLOOD RIGHT ANTECUBITAL  Final   Special Requests   Final    BOTTLES DRAWN AEROBIC ONLY Blood Culture results may not be optimal due to an inadequate volume of blood received in culture bottles   Culture   Final    NO GROWTH < 12 HOURS Performed at [facility], [address], [place], [place] [address]    Report Status PENDING  Incomplete      Imaging Studies   US Abdomen Limited RUQ (LIVER/GB)  Result Date: 10/14/2020 CLINICAL DATA:  30 year old male with abnormal liver enzymes. EXAM: ULTRASOUND ABDOMEN LIMITED RIGHT UPPER QUADRANT COMPARISON:  None. FINDINGS: Gallbladder: No gallstones or wall thickening visualized. No sonographic Murphy sign noted by sonographer. A 2 cm cystic structure adjacent to the gallbladder may represent a liver cyst. Common bile duct: Diameter: 6 mm Liver: There is slight irregularity of the liver contour suspicious for early cirrhosis. The liver is enlarged measuring at least 21 cm in length. Portal vein is patent on color Doppler imaging with normal direction of blood flow towards the liver. Other: Trace subhepatic  fluid. IMPRESSION: 1. Hepatomegaly with findings concerning for  early cirrhosis. Clinical correlation is recommended. 2. Trace free fluid adjacent to the liver. Electronically Signed   By: Elgie Collard M.D.   On: 10/14/2020 00:28     Medications   Scheduled Meds: . acetaminophen  1,000 mg Oral Q8H   Or  . acetaminophen  650 mg Rectal Q8H  . enoxaparin (LOVENOX) injection  40 mg Subcutaneous Q24H  . folic acid  1 mg Oral Daily  . insulin aspart  0-15 Units Subcutaneous TID AC & HS  . insulin glargine  15 Units Subcutaneous BID  . nicotine  14 mg Transdermal Daily   Continuous Infusions: . lactated ringers with kcl    . vancomycin 1,250 mg (10/14/20 0855)       LOS: 1 day    Time spent: 30 minutes    Pennie Banter, DO [facility]  10/14/2020, 2:07 PM      If 7PM-7AM, please contact night-coverage. How to contact the [facility] Attending or Consulting provider 7A - 7P or covering provider during after hours 7P -7A, for this patient?    1. Check the care team in [facility] and look for a) attending/consulting TRH provider listed and b) the [facility] team listed 2. Log into [email or web address] and use [facility]'s universal password to access. If you do not have the password, please contact the hospital operator. 3. Locate the [facility] provider you are looking for under [facility] and page to a number that you can be directly reached. 4. If you still have difficulty reaching the provider, please page the [facility] (Director on Call) for the Hospitalists listed on amion for assistance.

## 2020-10-14 NOTE — Anesthesia Postprocedure Evaluation (Signed)
Anesthesia Post Note  Patient: Keith Riggs  Procedure(s) Performed: IRRIGATION AND DEBRIDEMENT LEFT DORSAL HAND WITH TENSOR SYNOVECTOMY (Left Hand)     Patient location during evaluation: PACU Anesthesia Type: General Level of consciousness: awake and alert Pain management: pain level controlled Vital Signs Assessment: post-procedure vital signs reviewed and stable Respiratory status: spontaneous breathing, nonlabored ventilation, respiratory function stable and patient connected to nasal cannula oxygen Cardiovascular status: blood pressure returned to baseline and stable Postop Assessment: no apparent nausea or vomiting Anesthetic complications: no   No complications documented.  Last Vitals:  Vitals:   10/13/20 2150 10/13/20 2224  BP: (!) 143/88 138/82  Pulse: 95 86  Resp: 16 16  Temp: (!) 36.2 C 36.8 C  SpO2: 96% 97%    Last Pain:  Vitals:   10/14/20 0030  TempSrc:   PainSc: Asleep                 Shelton Silvas

## 2020-10-14 NOTE — Progress Notes (Signed)
   Ortho Hand Progress Note  Subjective: No acute events last night. Continued pain in left hand   Objective: Vital signs in last 24 hours: Temp:  [97.1 F (36.2 C)-98.2 F (36.8 C)] 97.4 F (36.3 C) (05/08 0437) Pulse Rate:  [86-98] 88 (05/08 0437) Resp:  [16-20] 18 (05/08 0437) BP: (138-144)/(82-94) 144/89 (05/08 0437) SpO2:  [96 %-100 %] 98 % (05/08 0437) Weight:  [90.3 kg] 90.3 kg (05/07 2100)  Intake/Output from previous day: 05/07 0701 - 05/08 0700 In: 700 [I.V.:700] Out: 2020 [Urine:2000; Blood:20] Intake/Output this shift: No intake/output data recorded.  Recent Labs    10/13/20 2253 10/14/20 0209  HGB 13.1 14.1   Recent Labs    10/13/20 2253 10/14/20 0209  WBC 12.1* 12.3*  RBC 4.41 4.70  HCT 38.5* 41.2  PLT 304 329   Recent Labs    10/13/20 2253 10/14/20 0209  NA 134* 134*  K 3.8 3.7  CL 104 100  CO2 23 25  BUN <5* <5*  CREATININE 0.40* 0.47*  GLUCOSE 187* 199*  CALCIUM 8.1* 8.3*   Recent Labs    10/13/20 2253  INR 1.1    Aaox3 nad Resp nonlabored RRR LUE: dressings changed. Decreased swelling and erythema to the left hand. Intact flex/ex of the digits. Fingers wwp with bcr. Intact sensation to the finger tip   Assessment/Plan: Left dorsal hand abscess s/p I&D, extensor tenosynovectomy. DOS 5/7  - Medicine primary. Appreciate management - continue IV abx - Cxs pending. Gram stain GPCs - Continue LUE elevation - PT hydrotherapy for wound care and dressing changes  Remainder per primary   Cain Saupe III 10/14/2020, 2:47 PM  [phone]

## 2020-10-14 NOTE — Progress Notes (Addendum)
PT Cancellation Note  Patient Details Name: Keith Riggs MRN: [id number] DOB: 1990/06/22   Cancelled Treatment:    Reason Eval/Treat Not Completed: Other (comment) Pt requesting to eat lunch before hydrotherapy.  Addendum: Returned at 14:00. Pt in 10/10 pain at rest; squirming, crying, and moaning. Pt significant other at bedside providing emotional support. DO [name] and RN notified. Will hold on hydrotherapy currently.    [name], PT, DPT Acute Rehabilitation Services Pager [phone] Office [phone]    [name] 10/14/2020, 11:32 AM

## 2020-10-14 NOTE — Hospital Course (Addendum)
30 year old male with past medical history of ongoing IVDU (heroin), nicotine dependence, depression, insulin-dependent type 2 diabetes, complicated MRSA infection in 2018 including sepsis, osteomyelitis, epidural abscess and inguinal abscesses.    Admitted to [facility] as transfer from [facility] due to left hand pain and swelling due to a large abscess.  Orthopedics took patient to the OR  on evening of admission and performed irrigation and debridement of the abscess in addition to extensor tenosynovectomy of the tendons of the 2nd 3rd and 4th dorsal compartments.  Patient was empirically covered with IV Vancomycin, given prior severe MRSA infections.  Patient's blood sugars remained very elevated in the hospital due to him drinking sugar sodas and non-compliance with carb control despite extensive counseling on important of glucose control for infection and wound healing.

## 2020-10-14 NOTE — TOC Progression Note (Signed)
Transition of Care [facility]) - Progression Note    Patient Details  Name: Keith Riggs MRN: [id number] Date of Birth: [birth year]  Transition of Care [facility]) CM/SW Contact  [address] [place], [place] Phone Number: 10/14/2020, 11:55 AM  Clinical Narrative:     Patient is a 30 year old male who presents to [facility] as a transfer from [facility] due to left hand pain and swelling. TOC consulted for substance abuse counseling. Patient. states that he lives in a home with his girlfriend. He is followed by [facility] and uses [facility] in [place] . Patient's substance use addressed, resources offered. Patient declined need for substance abuse treatment resources at this time. Patient plans to return home with his girlfriend at discharge.  Transition of Care to continue to follow to assist with discharge needs.  Rachel Rison, LCSW Transition of Care [phone]    Barriers to Discharge: Continued Medical Work up  Expected Discharge Plan and Services         Living arrangements for the past 2 months: Single Family Home                                       Social Determinants of Health (SDOH) Interventions    Readmission Risk Interventions No flowsheet data found.

## 2020-10-15 LAB — PROTIME-INR
INR: 1 (ref 0.8–1.2)
Prothrombin Time: 13.4 seconds (ref 11.4–15.2)

## 2020-10-15 LAB — COMPREHENSIVE METABOLIC PANEL
ALT: 138 U/L — ABNORMAL HIGH (ref 0–44)
AST: 120 U/L — ABNORMAL HIGH (ref 15–41)
Albumin: 2.6 g/dL — ABNORMAL LOW (ref 3.5–5.0)
Alkaline Phosphatase: 147 U/L — ABNORMAL HIGH (ref 38–126)
Anion gap: 5 (ref 5–15)
BUN: 7 mg/dL (ref 6–20)
CO2: 29 mmol/L (ref 22–32)
Calcium: 8.7 mg/dL — ABNORMAL LOW (ref 8.9–10.3)
Chloride: 101 mmol/L (ref 98–111)
Creatinine, Ser: 0.52 mg/dL — ABNORMAL LOW (ref 0.61–1.24)
GFR, Estimated: >60 mL/min (ref >60)
Glucose, Bld: 264 mg/dL — ABNORMAL HIGH (ref 70–99)
Potassium: 4 mmol/L (ref 3.5–5.1)
Sodium: 135 mmol/L (ref 135–145)
Total Bilirubin: 0.6 mg/dL (ref 0.3–1.2)
Total Protein: 7.2 g/dL (ref 6.5–8.1)

## 2020-10-15 LAB — CBC
HCT: 45.1 % (ref 39.0–52.0)
Hemoglobin: 15.2 g/dL (ref 13.0–17.0)
MCH: 29.7 pg (ref 26.0–34.0)
MCHC: 33.7 g/dL (ref 30.0–36.0)
MCV: 88.1 fL (ref 80.0–100.0)
Platelets: 411 10*3/uL — ABNORMAL HIGH (ref 150–400)
RBC: 5.12 MIL/uL (ref 4.22–5.81)
RDW: 13 % (ref 11.5–15.5)
WBC: 9.9 10*3/uL (ref 4.0–10.5)
nRBC: 0 % (ref 0.0–0.2)

## 2020-10-15 LAB — GLUCOSE, CAPILLARY
Glucose-Capillary: 282 mg/dL — ABNORMAL HIGH (ref 70–99)
Glucose-Capillary: 362 mg/dL — ABNORMAL HIGH (ref 70–99)
Glucose-Capillary: 349 mg/dL — ABNORMAL HIGH (ref 70–99)
Glucose-Capillary: 301 mg/dL — ABNORMAL HIGH (ref 70–99)

## 2020-10-15 MED ORDER — INSULIN GLARGINE 100 UNIT/ML ~~LOC~~ SOLN
25.0000 [IU] | Freq: Two times a day (BID) | SUBCUTANEOUS | Status: DC
  Administered 2020-10-15 – 2020-10-16 (×2): 25 [IU] via SUBCUTANEOUS
  Filled 2020-10-15 (×3): qty 0.25

## 2020-10-15 MED ORDER — INSULIN ASPART 100 UNIT/ML IJ SOLN
8.0000 [IU] | Freq: Three times a day (TID) | INTRAMUSCULAR | Status: DC
  Administered 2020-10-15 – 2020-10-16 (×3): 8 [IU] via SUBCUTANEOUS

## 2020-10-15 NOTE — Progress Notes (Signed)
PROGRESS NOTE    Keith Riggs   WFU:[id number]  DOB: January 15, 1991  PCP: [name], PA-C    DOA: 10/13/2020 LOS: 2   Brief Narrative   30 year old male with past medical history of ongoing IVDU (heroin), nicotine dependence, depression, insulin-dependent type 2 diabetes, complicated MRSA infection in 2018 including sepsis, osteomyelitis, epidural abscess and inguinal abscesses.    Admitted to [facility] as transfer from [facility] due to left hand pain and swelling due to a large abscess.  Orthopedics took patient to the OR  on evening of admission and performed irrigation and debridement of the abscess in addition to extensor tenosynovectomy of the tendons of the 2nd 3rd and 4th dorsal compartments.  Patient is on IV Vancomycin empirically, given prior severe MRSA infections.  5/8 pt having uncontrolled, severe pain in the left hand.    Assessment & Plan   Principal Problem:   Abscess of left hand Active Problems:   IVDU (intravenous drug user)   Nicotine dependence, cigarettes, uncomplicated   Uncontrolled type 2 diabetes mellitus with hyperglycemia, with long-term current use of insulin (HCC)   Hyponatremia   Abnormal liver enzymes   History of MRSA infection   Abscess of dorsal left hand -status post surgical I&D and extensor tenosynovectomy on 5/7. --Orthopedic surgery following, see their recs -- Continue IV vancomycin --Follow cultures -- Pain control per orders, adjust for adequate pain control -- PT OT  Hyponatremia -presented with sodium 128 in the ED.  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 Sodium level normalized with IV hydration. -- Stop IV fluids --Monitor BMP -- Encourage oral intake and hydration  Transaminitis --incidental finding on labs with ALT higher than AST and approximal 2:1 ratio.  Unclear etiology suspect related to infection and/or drug/alcohol related. -- Hep C antibody  positive.  Negative for hep A or B. 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  Covered for MRSA as above.   Insulin-dependent type 2 diabetes with hyperglycemia -uncontrolled with last A1c 14.3%. Pt drinking sodas in his room - CBG's running high as result. Reports taking 15 units of 70/30 insulin 3 times daily. Continue current regimen with Lantus twice daily and sliding scale NovoLog.  Add 8 units Novolog TID WC.  Increased Lantus 20>>25 BID.  IV drug use -TOC consulted to provide patient with community resources to assist with quitting.   Nicotine dependence -counseled regarding the importance of cessation.  Nicotine patch ordered.   Patient BMI: Body mass index is 27.77 kg/m.   DVT prophylaxis: enoxaparin (LOVENOX) injection 40 mg Start: 10/14/20 1600   Diet:  Diet Orders (From admission, onward)    Start     Ordered   10/14/20 0053  Diet Carb Modified Fluid consistency: Thin; Room service appropriate? Yes  Diet effective now       Question Answer Comment  Diet-HS Snack? Nothing   Calorie Level Medium 1600-2000   Fluid consistency: Thin   Room service appropriate? Yes      10/14/20 0052            Code Status: Full Code    Subjective 10/15/20    Patient continues to have uncontrolled pain in hand.  He was sleeping when I entered, woke to voice and rolled over to talk with me and began wincing  and crying in pain.  Denies fever, chills, cough, sore throat, congestion, N/V/D or any other acute complaints.    Disposition Plan & Communication   Status is: Inpatient  Inpatient status remains appropriate due to severity of illness, cultures pending and on empiric IV antibiotics.  Dispo: The patient is from: Home              Anticipated d/c is to: Home              Patient currently not medically stable for  discharge   Difficult to place patient no   Consults, Procedures, Significant Events   Consultants:   Orthopedic surgery  Procedures:   5/7 -surgical I&D with extensor tenosynovectomy of the left hand abscess  Antimicrobials:  Anti-infectives (From admission, onward)   Start     Dose/Rate Route Frequency Ordered Stop   10/13/20 2300  vancomycin (VANCOREADY) IVPB 1250 mg/250 mL        1,250 mg 166.7 mL/hr over 90 Minutes Intravenous Every 8 hours 10/13/20 2126     10/13/20 2015  vancomycin (VANCOCIN) powder  Status:  Discontinued          As needed 10/13/20 2100 10/13/20 2100   10/13/20 1917  ceFAZolin (ANCEF) 2-4 GM/100ML-% IVPB       Note to Pharmacy: Joneen Caraway   : cabinet override      10/13/20 1917 10/14/20 0729        Micro    Objective   Vitals:   10/15/20 0356 10/15/20 0427 10/15/20 0615 10/15/20 0806  BP:   136/86 131/82  Pulse:   79 81  Resp: 19 18 16 16   Temp:   98.3 F (36.8 C) 98.1 F (36.7 C)  TempSrc:   Oral Oral  SpO2:   100% 100%  Weight:      Height:        Intake/Output Summary (Last 24 hours) at 10/15/2020 1330 Last data filed at 10/15/2020 12/15/2020 Gross per 24 hour  Intake 625.6 ml  Output 2900 ml  Net -2274.4 ml   Filed Weights   10/13/20 2100  Weight: 90.3 kg    Physical Exam:  General exam: awake, alert, appears in distress due to pain Respiratory system: CTAB no wheezes rales or rhonci, diminished bases, normal respiratory effort, on room air. Cardiovascular system: normal S1/S2, RRR, no pedal edema.   Central nervous system: A&O x3. Normal speech Extremities: left hand in ace wrap, moves all, no edema  Labs   Data Reviewed: I have personally reviewed following labs and imaging studies  CBC: Recent Labs  Lab 10/13/20 2253 10/14/20 0209 10/15/20 0818  WBC 12.1* 12.3* 9.9  HGB 13.1 14.1 15.2  HCT 38.5* 41.2 45.1  MCV 87.3 87.7 88.1  PLT 304 329 411*   Basic Metabolic Panel: Recent Labs  Lab 10/13/20 2253  10/14/20 0209 10/15/20 0818  NA 134* 134* 135  K 3.8 3.7 4.0  CL 104 100 101  CO2 23 25 29   GLUCOSE 187* 199* 264*  BUN <5* <5* 7  CREATININE 0.40* 0.47* 0.52*  CALCIUM 8.1* 8.3* 8.7*  MG 1.6*  --   --   PHOS 3.0  --   --    GFR: Estimated Creatinine Clearance: 142.5 mL/min (A) (by C-G formula based on SCr of 0.52 mg/dL (L)). Liver Function Tests: Recent Labs  Lab 10/13/20 2253 10/14/20 0209 10/15/20 0818  AST 82* 93* 120*  ALT 137* 137* 138*  ALKPHOS 127* 130* 147*  BILITOT  0.7 0.8 0.6  PROT 6.1* 6.2* 7.2  ALBUMIN 2.4* 2.4* 2.6*   No results for input(s): LIPASE, AMYLASE in the last 168 hours. No results for input(s): AMMONIA in the last 168 hours. Coagulation Profile: Recent Labs  Lab 10/13/20 2253 10/15/20 0818  INR 1.1 1.0   Cardiac Enzymes: No results for input(s): CKTOTAL, CKMB, CKMBINDEX, TROPONINI in the last 168 hours. BNP (last 3 results) No results for input(s): PROBNP in the last 8760 hours. HbA1C: Recent Labs    10/13/20 2253  HGBA1C 14.3*   CBG: Recent Labs  Lab 10/14/20 1142 10/14/20 1637 10/14/20 2218 10/15/20 0800 10/15/20 1130  GLUCAP 225* 491* 343* 282* 362*   Lipid Profile: No results for input(s): CHOL, HDL, LDLCALC, TRIG, CHOLHDL, LDLDIRECT in the last 72 hours. Thyroid Function Tests: No results for input(s): TSH, T4TOTAL, FREET4, T3FREE, THYROIDAB in the last 72 hours. Anemia Panel: No results for input(s): VITAMINB12, FOLATE, FERRITIN, TIBC, IRON, RETICCTPCT in the last 72 hours. Sepsis Labs: No results for input(s): PROCALCITON, LATICACIDVEN in the last 168 hours.  Recent Results (from the past 240 hour(s))  Aerobic/Anaerobic Culture w Gram Stain (surgical/deep wound)     Status: None (Preliminary result)   Collection Time: 10/13/20  8:20 PM   Specimen: Abscess  Result Value Ref Range Status   Specimen Description ABSCESS LEFT HAND  Final   Special Requests SWAB SPEC A VANCO ANCEF  Final   Gram Stain   Final     ABUNDANT WBC PRESENT, PREDOMINANTLY PMN MODERATE GRAM POSITIVE COCCI IN CLUSTERS Performed at [facility], [address] [place], [place] [address]    Culture FEW STAPHYLOCOCCUS AUREUS  Final   Report Status PENDING  Incomplete   Organism ID, Bacteria STAPHYLOCOCCUS AUREUS  Final      Susceptibility   Staphylococcus aureus - MIC*    CIPROFLOXACIN <=0.5 SENSITIVE Sensitive     ERYTHROMYCIN <=0.25 SENSITIVE Sensitive     GENTAMICIN <=0.5 SENSITIVE Sensitive     OXACILLIN <=0.25 SENSITIVE Sensitive     TETRACYCLINE <=1 SENSITIVE Sensitive     VANCOMYCIN 1 SENSITIVE Sensitive     TRIMETH/SULFA <=10 SENSITIVE Sensitive     CLINDAMYCIN <=0.25 SENSITIVE Sensitive     RIFAMPIN <=0.5 SENSITIVE Sensitive     Inducible Clindamycin NEGATIVE Sensitive     * FEW STAPHYLOCOCCUS AUREUS  Culture, blood (routine x 2)     Status: None (Preliminary result)   Collection Time: 10/13/20 10:53 PM   Specimen: BLOOD  Result Value Ref Range Status   Specimen Description BLOOD RIGHT ANTECUBITAL  Final   Special Requests   Final    BOTTLES DRAWN AEROBIC ONLY Blood Culture results may not be optimal due to an inadequate volume of blood received in culture bottles   Culture   Final    NO GROWTH 2 DAYS Performed at [facility], [address] [place], [place] [address]    Report Status PENDING  Incomplete  Culture, blood (routine x 2)     Status: None (Preliminary result)   Collection Time: 10/13/20 10:53 PM   Specimen: BLOOD  Result Value Ref Range Status   Specimen Description BLOOD RIGHT ANTECUBITAL  Final   Special Requests   Final    BOTTLES DRAWN AEROBIC ONLY Blood Culture results may not be optimal due to an inadequate volume of blood received in culture bottles   Culture   Final    NO GROWTH 2 DAYS Performed at [facility],  [address] [place], [place] [address]    Report Status PENDING  Incomplete      Imaging Studies   US Abdomen Limited RUQ (LIVER/GB)  Result  Date: 10/14/2020 CLINICAL DATA:  30 year old male with abnormal liver enzymes. EXAM: ULTRASOUND ABDOMEN LIMITED RIGHT UPPER QUADRANT COMPARISON:  None. FINDINGS: Gallbladder: No gallstones or wall thickening visualized. No sonographic Murphy sign noted by sonographer. A 2 cm cystic structure adjacent to the gallbladder may represent a liver cyst. Common bile duct: Diameter: 6 mm Liver: There is slight irregularity of the liver contour suspicious for early cirrhosis. The liver is enlarged measuring at least 21 cm in length. Portal vein is patent on color Doppler imaging with normal direction of blood flow towards the liver. Other: Trace subhepatic fluid. IMPRESSION: 1. Hepatomegaly with findings concerning for early cirrhosis. Clinical correlation is recommended. 2. Trace free fluid adjacent to the liver. Electronically Signed   By: Elgie Collard M.D.   On: 10/14/2020 00:28     Medications   Scheduled Meds: . acetaminophen  1,000 mg Oral Q8H   Or  . acetaminophen  650 mg Rectal Q8H  . enoxaparin (LOVENOX) injection  40 mg Subcutaneous Q24H  . folic acid  1 mg Oral Daily  . insulin aspart  0-15 Units Subcutaneous TID AC & HS  . insulin aspart  8 Units Subcutaneous TID WC  . insulin glargine  25 Units Subcutaneous BID  . nicotine  14 mg Transdermal Daily   Continuous Infusions: . vancomycin 1,250 mg (10/15/20 0620)       LOS: 2 days    Time spent: 25 min with >50% spent at bedside and in coordination of care.    Pennie Banter, DO [facility]  10/15/2020, 1:30 PM      If 7PM-7AM, please contact night-coverage. How to contact the [facility] Attending or Consulting provider 7A - 7P or covering provider during after hours 7P -7A, for this patient?    1. Check the care team in [facility] and look for a) attending/consulting [facility] provider listed and b) the [facility] team listed 2. Log into [email or web address] and use [facility]'s universal password to access. If you do not have the password, please  contact the hospital operator. 3. Locate the [facility] provider you are looking for under [facility] and page to a number that you can be directly reached. 4. If you still have difficulty reaching the provider, please page the [facility] (Director on Call) for the Hospitalists listed on amion for assistance.

## 2020-10-15 NOTE — Progress Notes (Incomplete Revision)
PROGRESS NOTE    Norton Bivins   QIW:[id number]  DOB: 1990-09-10  PCP: [name], PA-C    DOA: 10/13/2020 LOS: 2   Brief Narrative   30 year old male with past medical history of ongoing IVDU (heroin), nicotine dependence, depression, insulin-dependent type 2 diabetes, complicated MRSA infection in 2018 including sepsis, osteomyelitis, epidural abscess and inguinal abscesses.    Admitted to [facility] as transfer from [facility] due to left hand pain and swelling due to a large abscess.  Orthopedics took patient to the OR  on evening of admission and performed irrigation and debridement of the abscess in addition to extensor tenosynovectomy of the tendons of the 2nd 3rd and 4th dorsal compartments.  Patient is on IV Vancomycin empirically, given prior severe MRSA infections.  5/8 pt having uncontrolled, severe pain in the left hand.    Assessment & Plan   Principal Problem:   Abscess of left hand Active Problems:   IVDU (intravenous drug user)   Nicotine dependence, cigarettes, uncomplicated   Uncontrolled type 2 diabetes mellitus with hyperglycemia, with long-term current use of insulin (HCC)   Hyponatremia   Abnormal liver enzymes   History of MRSA infection   Abscess of dorsal left hand -status post surgical I&D and extensor tenosynovectomy on 5/7. --Orthopedic surgery following, see their recs -- Continue IV vancomycin --Follow cultures -- Pain control per orders, adjust for adequate pain control -- PT OT  Left  Hyponatremia -presented with sodium 128 in the ED.  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 Sodium level normalized with IV hydration. -- Stop IV fluids --Monitor BMP -- Encourage oral intake and hydration  Transaminitis --incidental finding on labs with ALT higher than AST and approximal 2:1 ratio.  Unclear etiology suspect related to infection and/or drug/alcohol related. -- Hep C  antibody positive.  Negative for hep A or B. 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  Covered for MRSA as above.   Insulin-dependent type 2 diabetes with hyperglycemia -uncontrolled with last A1c 14.3%. Pt drinking sodas in his room - CBG's running high as result. Reports taking 15 units of 70/30 insulin 3 times daily. Continue current regimen with Lantus twice daily and sliding scale NovoLog.  Add 8 units Novolog TID WC.  Increased Lantus 20>>25 BID.  IV drug use -TOC consulted to provide patient with community resources to assist with quitting.   Nicotine dependence -counseled regarding the importance of cessation.  Nicotine patch ordered.   Patient BMI: Body mass index is 27.77 kg/m.   DVT prophylaxis: enoxaparin (LOVENOX) injection 40 mg Start: 10/14/20 1600   Diet:  Diet Orders (From admission, onward)    Start     Ordered   10/14/20 0053  Diet Carb Modified Fluid consistency: Thin; Room service appropriate? Yes  Diet effective now       Question Answer Comment  Diet-HS Snack? Nothing   Calorie Level Medium 1600-2000   Fluid consistency: Thin   Room service appropriate? Yes      10/14/20 0052            Code Status: Full Code    Subjective 10/15/20    Patient continues to have uncontrolled pain in hand.  He was sleeping when I entered, woke to voice and rolled over to talk with me and  began wincing and crying in pain.  Denies fever, chills, cough, sore throat, congestion, N/V/D or any other acute complaints.    Disposition Plan & Communication   Status is: Inpatient  Inpatient status remains appropriate due to severity of illness, cultures pending and on empiric IV antibiotics.  Dispo: The patient is from: Home              Anticipated d/c is to: Home              Patient currently not medically stable  for discharge   Difficult to place patient no   Consults, Procedures, Significant Events   Consultants:   Orthopedic surgery  Procedures:   5/7 -surgical I&D with extensor tenosynovectomy of the left hand abscess  Antimicrobials:  Anti-infectives (From admission, onward)   Start     Dose/Rate Route Frequency Ordered Stop   10/13/20 2300  vancomycin (VANCOREADY) IVPB 1250 mg/250 mL        1,250 mg 166.7 mL/hr over 90 Minutes Intravenous Every 8 hours 10/13/20 2126     10/13/20 2015  vancomycin (VANCOCIN) powder  Status:  Discontinued          As needed 10/13/20 2100 10/13/20 2100   10/13/20 1917  ceFAZolin (ANCEF) 2-4 GM/100ML-% IVPB       Note to Pharmacy: Joneen Caraway   : cabinet override      10/13/20 1917 10/14/20 0729        Micro    Objective   Vitals:   10/15/20 0356 10/15/20 0427 10/15/20 0615 10/15/20 0806  BP:   136/86 131/82  Pulse:   79 81  Resp: 19 18 16 16   Temp:   98.3 F (36.8 C) 98.1 F (36.7 C)  TempSrc:   Oral Oral  SpO2:   100% 100%  Weight:      Height:        Intake/Output Summary (Last 24 hours) at 10/15/2020 1330 Last data filed at 10/15/2020 12/15/2020 Gross per 24 hour  Intake 625.6 ml  Output 2900 ml  Net -2274.4 ml   Filed Weights   10/13/20 2100  Weight: 90.3 kg    Physical Exam:  General exam: awake, alert, appears in distress due to pain Respiratory system: CTAB no wheezes rales or rhonci, diminished bases, normal respiratory effort, on room air. Cardiovascular system: normal S1/S2, RRR, no pedal edema.   Central nervous system: A&O x3. Normal speech Extremities: left hand in ace wrap, moves all, no edema  Labs   Data Reviewed: I have personally reviewed following labs and imaging studies  CBC: Recent Labs  Lab 10/13/20 2253 10/14/20 0209 10/15/20 0818  WBC 12.1* 12.3* 9.9  HGB 13.1 14.1 15.2  HCT 38.5* 41.2 45.1  MCV 87.3 87.7 88.1  PLT 304 329 411*   Basic Metabolic Panel: Recent Labs  Lab 10/13/20 2253  10/14/20 0209 10/15/20 0818  NA 134* 134* 135  K 3.8 3.7 4.0  CL 104 100 101  CO2 23 25 29   GLUCOSE 187* 199* 264*  BUN <5* <5* 7  CREATININE 0.40* 0.47* 0.52*  CALCIUM 8.1* 8.3* 8.7*  MG 1.6*  --   --   PHOS 3.0  --   --    GFR: Estimated Creatinine Clearance: 142.5 mL/min (A) (by C-G formula based on SCr of 0.52 mg/dL (L)). Liver Function Tests: Recent Labs  Lab 10/13/20 2253 10/14/20 0209 10/15/20 0818  AST 82* 93* 120*  ALT 137* 137* 138*  ALKPHOS 127* 130* 147*  BILITOT 0.7 0.8 0.6  PROT 6.1* 6.2* 7.2  ALBUMIN 2.4* 2.4* 2.6*   No results for input(s): LIPASE, AMYLASE in the last 168 hours. No results for input(s): AMMONIA in the last 168 hours. Coagulation Profile: Recent Labs  Lab 10/13/20 2253 10/15/20 0818  INR 1.1 1.0   Cardiac Enzymes: No results for input(s): CKTOTAL, CKMB, CKMBINDEX, TROPONINI in the last 168 hours. BNP (last 3 results) No results for input(s): PROBNP in the last 8760 hours. HbA1C: Recent Labs    10/13/20 2253  HGBA1C 14.3*   CBG: Recent Labs  Lab 10/14/20 1142 10/14/20 1637 10/14/20 2218 10/15/20 0800 10/15/20 1130  GLUCAP 225* 491* 343* 282* 362*   Lipid Profile: No results for input(s): CHOL, HDL, LDLCALC, TRIG, CHOLHDL, LDLDIRECT in the last 72 hours. Thyroid Function Tests: No results for input(s): TSH, T4TOTAL, FREET4, T3FREE, THYROIDAB in the last 72 hours. Anemia Panel: No results for input(s): VITAMINB12, FOLATE, FERRITIN, TIBC, IRON, RETICCTPCT in the last 72 hours. Sepsis Labs: No results for input(s): PROCALCITON, LATICACIDVEN in the last 168 hours.  Recent Results (from the past 240 hour(s))  Aerobic/Anaerobic Culture w Gram Stain (surgical/deep wound)     Status: None (Preliminary result)   Collection Time: 10/13/20  8:20 PM   Specimen: Abscess  Result Value Ref Range Status   Specimen Description ABSCESS LEFT HAND  Final   Special Requests SWAB SPEC A VANCO ANCEF  Final   Gram Stain   Final     ABUNDANT WBC PRESENT, PREDOMINANTLY PMN MODERATE GRAM POSITIVE COCCI IN CLUSTERS Performed at [facility], [address], [place], [place] [address]    Culture FEW STAPHYLOCOCCUS AUREUS  Final   Report Status PENDING  Incomplete   Organism ID, Bacteria STAPHYLOCOCCUS AUREUS  Final      Susceptibility   Staphylococcus aureus - MIC*    CIPROFLOXACIN <=0.5 SENSITIVE Sensitive     ERYTHROMYCIN <=0.25 SENSITIVE Sensitive     GENTAMICIN <=0.5 SENSITIVE Sensitive     OXACILLIN <=0.25 SENSITIVE Sensitive     TETRACYCLINE <=1 SENSITIVE Sensitive     VANCOMYCIN 1 SENSITIVE Sensitive     TRIMETH/SULFA <=10 SENSITIVE Sensitive     CLINDAMYCIN <=0.25 SENSITIVE Sensitive     RIFAMPIN <=0.5 SENSITIVE Sensitive     Inducible Clindamycin NEGATIVE Sensitive     * FEW STAPHYLOCOCCUS AUREUS  Culture, blood (routine x 2)     Status: None (Preliminary result)   Collection Time: 10/13/20 10:53 PM   Specimen: BLOOD  Result Value Ref Range Status   Specimen Description BLOOD RIGHT ANTECUBITAL  Final   Special Requests   Final    BOTTLES DRAWN AEROBIC ONLY Blood Culture results may not be optimal due to an inadequate volume of blood received in culture bottles   Culture   Final    NO GROWTH 2 DAYS Performed at [facility], [address], [place], [place] [address]    Report Status PENDING  Incomplete  Culture, blood (routine x 2)     Status: None (Preliminary result)   Collection Time: 10/13/20 10:53 PM   Specimen: BLOOD  Result Value Ref Range Status   Specimen Description BLOOD RIGHT ANTECUBITAL  Final   Special Requests   Final    BOTTLES DRAWN AEROBIC ONLY Blood Culture results may not be optimal due to an inadequate volume of blood received in culture bottles   Culture   Final    NO GROWTH 2 DAYS Performed at [facility], [address] [place], [place] [address]    Report Status PENDING  Incomplete      Imaging Studies   US Abdomen Limited RUQ (LIVER/GB)  Result  Date: 10/14/2020 CLINICAL DATA:  30 year old male with abnormal liver enzymes. EXAM: ULTRASOUND ABDOMEN LIMITED RIGHT UPPER QUADRANT COMPARISON:  None. FINDINGS: Gallbladder: No gallstones or wall thickening visualized. No sonographic Murphy sign noted by sonographer. A 2 cm cystic structure adjacent to the gallbladder may represent a liver cyst. Common bile duct: Diameter: 6 mm Liver: There is slight irregularity of the liver contour suspicious for early cirrhosis. The liver is enlarged measuring at least 21 cm in length. Portal vein is patent on color Doppler imaging with normal direction of blood flow towards the liver. Other: Trace subhepatic fluid. IMPRESSION: 1. Hepatomegaly with findings concerning for early cirrhosis. Clinical correlation is recommended. 2. Trace free fluid adjacent to the liver. Electronically Signed   By: Elgie CollardArash  Radparvar M.D.   On: 10/14/2020 00:28     Medications   Scheduled Meds: . acetaminophen  1,000 mg Oral Q8H   Or  . acetaminophen  650 mg Rectal Q8H  . enoxaparin (LOVENOX) injection  40 mg Subcutaneous Q24H  . folic acid  1 mg Oral Daily  . insulin aspart  0-15 Units Subcutaneous TID AC & HS  . insulin aspart  8 Units Subcutaneous TID WC  . insulin glargine  25 Units Subcutaneous BID  . nicotine  14 mg Transdermal Daily   Continuous Infusions: . vancomycin 1,250 mg (10/15/20 0620)       LOS: 2 days    Time spent: 25 min with >50% spent at bedside and in coordination of care.    Pennie BanterKelly A Kwali Wrinkle, DO [facility]  10/15/2020, 1:30 PM      If 7PM-7AM, please contact night-coverage. How to contact the [facility] Attending or Consulting provider 7A - 7P or covering provider during after hours 7P -7A, for this patient?    1. Check the care team in [facility] and look for a) attending/consulting [facility] provider listed and b) the [facility] team listed 2. Log into [email or web address] and use 's universal password to access. If you do not have the password, please  contact the hospital operator. 3. Locate the [facility] provider you are looking for under [facility] and page to a number that you can be directly reached. 4. If you still have difficulty reaching the provider, please page the [facility] (Director on Call) for the Hospitalists listed on amion for assistance.

## 2020-10-15 NOTE — Progress Notes (Signed)
Physical Therapy Wound Evaluation and Treatment Patient Details  Name: Keith Riggs MRN: [id number] Date of Birth: [birth year]  Today's Date: 10/15/2020 Time: 9323-5573 Time Calculation (min): 32 min  Subjective  Subjective Assessment Subjective: anxious about pain. Patient and Family Stated Goals: Be able to manage dressing changes at home; take a shower Date of Onset:  (Unsure) Prior Treatments: s/p I&D on 10/13/2020  Pain Score:  Pt tolerated treatment well with IV pain meds prior to session beginning.   Wound Assessment  Wound / Incision (Open or Dehisced) 10/15/20 Incision - Open Hand Left;Posterior (Active)  Wound Image   10/15/20 1200  Dressing Type Compression wrap;Gauze (Comment);Moist to moist 10/15/20 1200  Dressing Changed Changed 10/15/20 1200  Dressing Status Clean;Dry;Intact 10/15/20 1200  Dressing Change Frequency Daily 10/15/20 1200  Site / Wound Assessment Red 10/15/20 1200  % Wound base Red or Granulating 100% 10/15/20 1200  % Wound base Yellow/Fibrinous Exudate 0% 10/15/20 1200  % Wound base Black/Eschar 0% 10/15/20 1200  % Wound base Other/Granulation Tissue (Comment) 0% 10/15/20 1200  Peri-wound Assessment Erythema (blanchable);Edema;Intact;Maceration 10/15/20 1200  Wound Length (cm) 9 cm 10/15/20 1200  Wound Width (cm) 0.2 cm 10/15/20 1200  Wound Surface Area (cm^2) 1.8 cm^2 10/15/20 1200  Tunneling (cm) Unable to assess due to pain 10/15/20 1200  Undermining (cm) Unable to assess due to pain 10/15/20 1200  Margins Unattached edges (unapproximated) 10/15/20 1200  Closure Surface sutures 10/15/20 1200  Drainage Amount Minimal 10/15/20 1200  Drainage Description Sanguineous;Serosanguineous 10/15/20 1200  Treatment Hydrotherapy (Pulse lavage);Packing (Saline gauze) 10/15/20 1200      Hydrotherapy Pulsed lavage therapy - wound location: L dorsal hand Pulsed Lavage with Suction (psi): 4 psi Pulsed Lavage with Suction - Normal Saline Used: 1000 mL Pulsed  Lavage Tip: Tip with splash shield    Wound Assessment and Plan  Wound Therapy - Assess/Plan/Recommendations Wound Therapy - Clinical Statement: Pt presents to hydrotheapy s/p I&D on 10/13/2020. Incision is sutured and openings between sutures are not large enough for packing. Overall the wound appears clean. Anticipate 1 more hydrotherapy session tomorrow to educate pt on dressing changes and then will likely be appropriate for nursing to take over dressing changes while pt is admitted. Wound Therapy - Functional Problem List: Acute pain and edema, decreased strength and AROM consistent with dorsal hand abscess and subsequent I&D Factors Delaying/Impairing Wound Healing: Diabetes Mellitus,Infection - systemic/local,Substance abuse Hydrotherapy Plan: Patient/family education,Dressing change,Pulsatile lavage with suction,Debridement Wound Therapy - Frequency: 6X / week Wound Therapy - Current Recommendations: OT Wound Therapy - Follow Up Recommendations: dressing changes by family/patient  Wound Therapy Goals- Improve the function of patient's integumentary system by progressing the wound(s) through the phases of wound healing (inflammation - proliferation - remodeling) by: Wound Therapy Goals - Improve the function of patient's integumentary system by progressing the wound(s) through the phases of wound healing by: Improve Drainage Characteristics: Min,Serous Improve Drainage Characteristics - Progress: Goal set today Patient/Family will be able to : demonstrate dressing changes with no assistance and min cues. Patient/Family Instruction Goal - Progress: Goal set today Goals/treatment plan/discharge plan were made with and agreed upon by patient/family: Yes Time For Goal Achievement: 7 days Wound Therapy - Potential for Goals: Good  Goals will be updated until maximal potential achieved or discharge criteria met.  Discharge criteria: when goals achieved, discharge from hospital, MD  decision/surgical intervention, no progress towards goals, refusal/missing three consecutive treatments without notification or medical reason.  GP     Charges PT Wound Care  Charges $PT Hydrotherapy Dressing: 1 dressing $PT PLS Gun and Tip: 1 Supply $PT Hydrotherapy Visit: 1 Visit       Thelma Comp 10/15/2020, 1:05 PM  Rolinda Roan, PT, DPT Acute Rehabilitation Services Pager: [phone] Office: [phone]

## 2020-10-15 NOTE — Progress Notes (Signed)
   Ortho Hand Progress Note  Subjective: No acute events last night. Stable pain in left hand   Objective: Vital signs in last 24 hours: Temp:  [98.1 F (36.7 C)-98.3 F (36.8 C)] 98.1 F (36.7 C) (05/09 0806) Pulse Rate:  [79-87] 81 (05/09 0806) Resp:  [16-19] 16 (05/09 0806) BP: (131-136)/(79-86) 131/82 (05/09 0806) SpO2:  [100 %] 100 % (05/09 0806)  Intake/Output from previous day: 05/08 0701 - 05/09 0700 In: 625.6 [IV Piggyback:625.6] Out: 2000 [Urine:2000] Intake/Output this shift: Total I/O In: -  Out: 900 [Urine:900]  Recent Labs    10/13/20 2253 10/14/20 0209  HGB 13.1 14.1   Recent Labs    10/13/20 2253 10/14/20 0209  WBC 12.1* 12.3*  RBC 4.41 4.70  HCT 38.5* 41.2  PLT 304 329   Recent Labs    10/13/20 2253 10/14/20 0209  NA 134* 134*  K 3.8 3.7  CL 104 100  CO2 23 25  BUN <5* <5*  CREATININE 0.40* 0.47*  GLUCOSE 187* 199*  CALCIUM 8.1* 8.3*   Recent Labs    10/13/20 2253  INR 1.1    Aaox3 nad Resp nonlabored RRR LUE: dressings changed. Decreased swelling and erythema to the left hand. Drains pulled. Scant amount of drainage from the wound.  Intact flex/ex of the digits. Fingers wwp with bcr. Intact sensation to the finger tip   Assessment/Plan: Left dorsal hand abscess s/p I&D, extensor tenosynovectomy. DOS 5/7  - Medicine primary. Appreciate management - continue IV abx - Cxs Staph. Gram stain GPCs - Continue LUE elevation - PT hydrotherapy for wound care and dressing changes  Remainder per primary   Cain Saupe III 10/15/2020, 8:11 AM  [phone]

## 2020-10-16 ENCOUNTER — Other Ambulatory Visit (HOSPITAL_COMMUNITY): Payer: Self-pay

## 2020-10-16 LAB — GLUCOSE, CAPILLARY
Glucose-Capillary: 427 mg/dL — ABNORMAL HIGH (ref 70–99)
Glucose-Capillary: 418 mg/dL — ABNORMAL HIGH (ref 70–99)

## 2020-10-16 LAB — VANCOMYCIN, TROUGH: Vancomycin Tr: 9 ug/mL — ABNORMAL LOW (ref 15–20)

## 2020-10-16 LAB — VANCOMYCIN, PEAK: Vancomycin Pk: 14 ug/mL — ABNORMAL LOW (ref 30–40)

## 2020-10-16 MED ORDER — INSULIN ASPART 100 UNIT/ML FLEXPEN
10.0000 [IU] | PEN_INJECTOR | Freq: Three times a day (TID) | SUBCUTANEOUS | 11 refills | Status: AC
  Filled 2020-10-16: qty 9, 30d supply, fill #0

## 2020-10-16 MED ORDER — DOXYCYCLINE HYCLATE 100 MG PO TABS
100.0000 mg | ORAL_TABLET | Freq: Two times a day (BID) | ORAL | 0 refills | Status: AC
  Filled 2020-10-16: qty 28, 14d supply, fill #0

## 2020-10-16 MED ORDER — OXYCODONE HCL 10 MG PO TABS
10.0000 mg | ORAL_TABLET | ORAL | 0 refills | Status: AC | PRN
  Filled 2020-10-16: qty 30, 6d supply, fill #0

## 2020-10-16 MED ORDER — ORITAVANCIN DIPHOSPHATE 400 MG IV SOLR
1200.0000 mg | Freq: Once | INTRAVENOUS | Status: AC
  Administered 2020-10-16: 1200 mg via INTRAVENOUS
  Filled 2020-10-16: qty 120

## 2020-10-16 MED ORDER — HYDROMORPHONE HCL 1 MG/ML IJ SOLN
1.0000 mg | INTRAMUSCULAR | Status: DC | PRN
  Administered 2020-10-16: 1 mg via INTRAVENOUS
  Filled 2020-10-16: qty 1

## 2020-10-16 MED ORDER — CEFAZOLIN SODIUM-DEXTROSE 2-4 GM/100ML-% IV SOLN
2.0000 g | Freq: Three times a day (TID) | INTRAVENOUS | Status: DC
  Administered 2020-10-16: 2 g via INTRAVENOUS
  Filled 2020-10-16: qty 100

## 2020-10-16 MED ORDER — ACETAMINOPHEN 500 MG PO TABS
1000.0000 mg | ORAL_TABLET | Freq: Three times a day (TID) | ORAL | 0 refills | Status: AC
  Filled 2020-10-16: qty 30, 5d supply, fill #0

## 2020-10-16 MED ORDER — FOLIC ACID 1 MG PO TABS: 1.0000 mg | ORAL_TABLET | Freq: Every day | ORAL | Status: AC

## 2020-10-16 MED ORDER — OXYCODONE HCL 5 MG PO TABS
10.0000 mg | ORAL_TABLET | ORAL | Status: DC | PRN
  Administered 2020-10-16: 10 mg via ORAL
  Filled 2020-10-16: qty 2

## 2020-10-16 NOTE — Progress Notes (Signed)
Physical Therapy Wound Treatment and Discharge Patient Details  Name: Keith Riggs MRN: [id number] Date of Birth: 08-23-1990  Today's Date: 10/16/2020 Time: 1110-1130 Time Calculation (min): 20 min  Subjective  Subjective Assessment Subjective: anxious about pain. Patient and Family Stated Goals: Be able to manage dressing changes at home; take a shower Date of Onset:  (Unsure) Prior Treatments: s/p I&D on 10/13/2020  Pain Score:  Pt premedicated and tolerated treatment well without complaints.   Wound Assessment  Wound / Incision (Open or Dehisced) 10/15/20 Incision - Open Hand Left;Posterior (Active)  Dressing Type Compression wrap;Gauze (Comment);Moist to moist 10/16/20 1254  Dressing Changed Changed 10/16/20 1254  Dressing Status Clean;Dry;Intact 10/16/20 1254  Dressing Change Frequency Daily 10/16/20 1254  Site / Wound Assessment Red 10/16/20 1254  % Wound base Red or Granulating 100% 10/16/20 1254  % Wound base Yellow/Fibrinous Exudate 0% 10/16/20 1254  % Wound base Black/Eschar 0% 10/16/20 1254  % Wound base Other/Granulation Tissue (Comment) 0% 10/16/20 1254  Peri-wound Assessment Erythema (blanchable) 10/16/20 1254  Wound Length (cm) 9 cm 10/15/20 1200  Wound Width (cm) 0.2 cm 10/15/20 1200  Wound Surface Area (cm^2) 1.8 cm^2 10/15/20 1200  Tunneling (cm) Unable to assess due to pain 10/15/20 1200  Undermining (cm) Unable to assess due to pain 10/15/20 1200  Margins Unattached edges (unapproximated) 10/16/20 1254  Closure Surface sutures 10/16/20 1254  Drainage Amount Minimal 10/16/20 1254  Drainage Description Sanguineous;Serosanguineous 10/16/20 1254  Treatment Hydrotherapy (Pulse lavage);Packing (Saline gauze) 10/16/20 1254      Hydrotherapy Pulsed lavage therapy - wound location: L dorsal hand Pulsed Lavage with Suction (psi): 4 psi Pulsed Lavage with Suction - Normal Saline Used: 1000 mL Pulsed Lavage Tip: Tip with splash shield    Wound Assessment and Plan   Wound Therapy - Assess/Plan/Recommendations Wound Therapy - Clinical Statement: Focus of session was discharge education for dressing changes at home. Pt reports his girlfriend's mother is a Marine scientist and will be able to help him, as she assisted with dressing changes the last time he had an I&D done. Pt reports feeling comfortable with the process of changing his dressing and all questions were answered. Pt also educated on AROM and passive stretching exercises for fingers/hand and wrist. This pt is  appropriate for nursing to take over dressing changes while pt is admitted, and RN notified. Hydrotherapy will sign off at this time. Wound Therapy - Functional Problem List: Acute pain and edema, decreased strength and AROM consistent with dorsal hand abscess and subsequent I&D Factors Delaying/Impairing Wound Healing: Diabetes Mellitus,Infection - systemic/local,Substance abuse Hydrotherapy Plan: Patient/family education,Dressing change,Pulsatile lavage with suction,Debridement Wound Therapy - Frequency: 6X / week Wound Therapy - Current Recommendations: OT Wound Therapy - Follow Up Recommendations: dressing changes by family/patient  Wound Therapy Goals- Improve the function of patient's integumentary system by progressing the wound(s) through the phases of wound healing (inflammation - proliferation - remodeling) by: Wound Therapy Goals - Improve the function of patient's integumentary system by progressing the wound(s) through the phases of wound healing by: Improve Drainage Characteristics: Min,Serous Improve Drainage Characteristics - Progress: Progressing toward goal Patient/Family will be able to : demonstrate dressing changes with no assistance and min cues. Patient/Family Instruction Goal - Progress: Progressing toward goal Goals/treatment plan/discharge plan were made with and agreed upon by patient/family: Yes Time For Goal Achievement: 7 days Wound Therapy - Potential for Goals:  Good  Goals will be updated until maximal potential achieved or discharge criteria met.  Discharge criteria: when goals achieved,  discharge from hospital, MD decision/surgical intervention, no progress towards goals, refusal/missing three consecutive treatments without notification or medical reason.  GP     Charges PT Wound Care Charges $PT PLS Gun and Tip: 1 Supply $PT Hydrotherapy Visit: 1 Visit       Thelma Comp 10/16/2020, 1:15 PM   Rolinda Roan, PT, DPT Acute Rehabilitation Services Pager: [phone] Office: [phone]

## 2020-10-16 NOTE — Progress Notes (Signed)
   Ortho Hand Progress Note  Subjective: No acute events last night. Improved pain in left hand. Stated hydrotherapy felt good yesterday  Objective: Vital signs in last 24 hours: Temp:  [97.8 F (36.6 C)-98.5 F (36.9 C)] 97.8 F (36.6 C) (05/10 0617) Pulse Rate:  [81-91] 85 (05/10 0617) Resp:  [16-18] 18 (05/10 0617) BP: (120-131)/(80-85) 120/85 (05/10 0617) SpO2:  [97 %-100 %] 100 % (05/10 0617)  Intake/Output from previous day: 05/09 0701 - 05/10 0700 In: -  Out: 2300 [Urine:2300] Intake/Output this shift: No intake/output data recorded.  Recent Labs    10/13/20 2253 10/14/20 0209 10/15/20 0818  HGB 13.1 14.1 15.2   Recent Labs    10/14/20 0209 10/15/20 0818  WBC 12.3* 9.9  RBC 4.70 5.12  HCT 41.2 45.1  PLT 329 411*   Recent Labs    10/14/20 0209 10/15/20 0818  NA 134* 135  K 3.7 4.0  CL 100 101  CO2 25 29  BUN <5* 7  CREATININE 0.47* 0.52*  GLUCOSE 199* 264*  CALCIUM 8.3* 8.7*   Recent Labs    10/13/20 2253 10/15/20 0818  INR 1.1 1.0    Aaox3 nad Resp nonlabored RRR LUE: dressings changed. Decreased swelling and erythema to the left hand. Small amount of drainage from the wound.  Intact flex/ex of the digits. Fingers wwp with bcr. Intact sensation to the finger tip   Assessment/Plan: Left dorsal hand abscess s/p I&D, extensor tenosynovectomy. DOS 5/7  - Medicine primary. Appreciate management - continue IV abx - Cxs MSSA. Gram stain GPCs - Continue LUE elevation - PT hydrotherapy for wound care and dressing changes  - Can do daily dressing changes at home. OK to clean incision with gentle soap and water daily. Ok also to clean incision daily with small amount of peroxide.   Remainder per primary  OK for d/c from hand standpoint once abx plan established, patient educated on dressing changes and medically stable. Follow up with me later this week or early next week.   Keith Riggs 10/16/2020, 7:26 AM  ([phone])  [phone]

## 2020-10-16 NOTE — Progress Notes (Signed)
Pharmacy Antibiotic Note  Keith Riggs is a 30 y.o. male admitted on 10/13/2020 with L hand abscess s/p I&D. Patient was started on vancomycin given history of extensive MRSA infection.  Abscess culture growing MSSA, so antibiotic to be transitioned to Ancef.  Noted vancomycin AUC is sub-therapeutic.  Plan: D/C vanc per discussion with MD Ancef 2gm IV Q8H Pharmacy will sign off with stable renal function  Height: 5\' 11"  (180.3 cm) Weight: 90.3 kg (199 lb 1.2 oz) IBW/kg (Calculated) : 75.3  Temp (24hrs), Avg:98 F (36.7 C), Min:97.7 F (36.5 C), Max:98.5 F (36.9 C)  Recent Labs  Lab 10/13/20 2253 10/14/20 0209 10/15/20 0818 10/16/20 0055 10/16/20 0627  WBC 12.1* 12.3* 9.9  --   --   CREATININE 0.40* 0.47* 0.52*  --   --   VANCOTROUGH  --   --   --   --  9*  VANCOPEAK  --   --   --  14*  --     Estimated Creatinine Clearance: 142.5 mL/min (A) (by C-G formula based on SCr of 0.52 mg/dL (L)).    Allergies  Allergen Reactions  . Bee Venom Anaphylaxis  . Voltaren [Diclofenac Sodium] Other (See Comments)    "Makes me feel not myself"  . Latex Rash   Vanc 5/7 >> 5/10 Ancef 5/10 >>   5/10 VP/VT 14/9, AUC = 288 on 1250mg  q8 >> incr 1750mg  q8 but changed to Ancef *trough drawn 3 min after vanc infusion charted, should be ok  5/7 abscess cx - MSSA, holding for anaerobes  5/7 BCx - NGTD  Jakwon Gayton D. 7/7, PharmD, BCPS, BCCCP 10/16/2020, 8:02 AM

## 2020-10-16 NOTE — Consult Note (Signed)
[facility]    Date of Admission:  10/13/2020     Total days of antibiotics 4               Reason for Consult: Hand abscess   Referring Provider: [name] Primary Care Provider: [name], PA-C   ASSESSMENT:  Keith Riggs is a 30 y/o caucasian male with MSSA abscess of the left hand s/p I&D likely obtained from ongoing IV drug use. Blood cultures have been without growth and appears isolated to the left hand and does not appear to need a prolonged course of IV antibiotics. Will give a dose of oritivancin followed by oral doxycycline for 2 weeks. Continue wound care per Orthopedic recommendations. If has a Hepatitis C RNA level can arrange follow up in the ID clinic for outpatient treatment. Ok for discharge after IV infusion of Oritivancin.   PLAN:  1. Continue Ancef while in the hospital 2. Will give one dose of ortivancin followed by oral doxycycline to start in 1 week.  3. Continue wound care and follow up per orthopedics.  4. Ok for discharge following oritivancin infusion per ID standpoint.   Principal Problem:   Abscess of left hand Active Problems:   IVDU (intravenous drug user)   Nicotine dependence, cigarettes, uncomplicated   Uncontrolled type 2 diabetes mellitus with hyperglycemia, with long-term current use of insulin (HCC)   Hyponatremia   Abnormal liver enzymes   History of MRSA infection   . acetaminophen  1,000 mg Oral Q8H   Or  . acetaminophen  650 mg Rectal Q8H  . enoxaparin (LOVENOX) injection  40 mg Subcutaneous Q24H  . folic acid  1 mg Oral Daily  . insulin aspart  0-15 Units Subcutaneous TID AC & HS  . insulin aspart  8 Units Subcutaneous TID WC  . insulin glargine  25 Units Subcutaneous BID  . nicotine  14 mg Transdermal Daily     HPI: Keith Riggs is a 30 y.o. 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   Symptoms began 4 days prior to presentation to [facility] with pain and swelling. Has ongoing drug use Heroin. Was working in the yard and several days prior and believes he may have had a bug bite in the area.  In the ED found to have "baseball sized" swelling to the dorsum of the wrist. X-ray without evidence of osteomyelitis. Dr. [name] performed I&D of the area on 5/7 with an abundant amount of purulent fluid encountered and cultured. Specimens with gram stain showing gram positive cocci and cultures with MSSA.   [name] has been afebrile since admission and currently POD  #3 from I&D. Currently on Day 4 of vancomycin and switched to Ancef earlier this morning. Blood cultures have been without growth to date. Found to have elevated liver enzymes and positive Hepatitis C antibody with Hepatitis C RNA level pending.   Continues to have hand pain. No fevers or chills. Eager to finish treatment and to go home.   Review of Systems: Review of Systems  Constitutional: Negative for chills, fever and weight loss.  Respiratory: Negative for cough, shortness of breath and wheezing.   Cardiovascular: Negative for chest pain and leg swelling.  Gastrointestinal: Negative for abdominal pain, constipation, diarrhea, nausea and vomiting.  Musculoskeletal:       Positive  for left hand pain  Skin: Negative for rash.     Past Medical History:  Diagnosis Date  . 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 Diabetes mellitus without complication (HCC)   . 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 Polysubstance abuse (HCC) 12/20/2016  . Sepsis due to methicillin resistant Staphylococcus aureus (MRSA) (HCC) 12/22/2016  . Soft tissue abscess of inguinal region 12/20/2016    Social History   Tobacco Use  . Smoking status: Current Every Day Smoker    Packs/day: 0.50     Types: Cigarettes  . Smokeless tobacco: Current User    Types: Snuff  . Tobacco comment: Began 2006  Substance Use Topics  . Alcohol use: No  . Drug use: No    Types: IV, Heroin, Cocaine    Comment: Still actively using Heroin, last use approx 2 weeks ago as of 10/13/2020    Family History  Problem Relation Age of Onset  . Diabetes Father   . Heart disease Father   . Diabetes Maternal Grandmother   . Miscarriages / Stillbirths Maternal Grandmother   . Cancer Neg Hx     Allergies  Allergen Reactions  . Bee Venom Anaphylaxis  . Voltaren [Diclofenac Sodium] Other (See Comments)    "Makes me feel not myself"  . Latex Rash    OBJECTIVE: Blood pressure 124/73, pulse 72, temperature 97.7 F (36.5 C), temperature source Oral, resp. rate 18, height 5\' 11"  (1.803 m), weight 90.3 kg, SpO2 99 %.  Physical Exam Constitutional:      General: He is not in acute distress.    Appearance: He is well-developed.  Cardiovascular:     Rate and Rhythm: Normal rate and regular rhythm.     Heart sounds: Normal heart sounds.  Pulmonary:     Effort: Pulmonary effort is normal.     Breath sounds: Normal breath sounds.  Musculoskeletal:     Comments: Surgical dressing is clean and dry.   Skin:    General: Skin is warm and dry.  Neurological:     Mental Status: He is alert and oriented to person, place, and time.  Psychiatric:        Behavior: Behavior normal.        Thought Content: Thought content normal.        Judgment: Judgment normal.     Lab Results Lab Results  Component Value Date   WBC 9.9 10/15/2020   HGB 15.2 10/15/2020   HCT 45.1 10/15/2020   MCV 88.1 10/15/2020   PLT 411 (H) 10/15/2020    Lab Results  Component Value Date   CREATININE 0.52 (L) 10/15/2020   BUN 7 10/15/2020   NA 135 10/15/2020   K 4.0 10/15/2020   CL 101 10/15/2020   CO2 29 10/15/2020    Lab Results  Component Value Date   ALT 138 (H) 10/15/2020   AST 120 (H) 10/15/2020   ALKPHOS 147 (H)  10/15/2020   BILITOT 0.6 10/15/2020     Microbiology: Recent Results (from the past 240 hour(s))  Aerobic/Anaerobic Culture w Gram Stain (surgical/deep wound)     Status: None (Preliminary result)   Collection Time: 10/13/20  8:20 PM   Specimen: Abscess  Result Value Ref Range Status   Specimen Description ABSCESS LEFT HAND  Final   Special Requests SWAB SPEC A VANCO ANCEF  Final   Gram Stain   Final    ABUNDANT WBC  PRESENT, PREDOMINANTLY PMN MODERATE GRAM POSITIVE COCCI IN CLUSTERS Performed at [facility], [address], [place], [place] [address]    Culture   Final    FEW STAPHYLOCOCCUS AUREUS NO ANAEROBES ISOLATED; CULTURE IN PROGRESS FOR 5 DAYS    Report Status PENDING  Incomplete   Organism ID, Bacteria STAPHYLOCOCCUS AUREUS  Final      Susceptibility   Staphylococcus aureus - MIC*    CIPROFLOXACIN <=0.5 SENSITIVE Sensitive     ERYTHROMYCIN <=0.25 SENSITIVE Sensitive     GENTAMICIN <=0.5 SENSITIVE Sensitive     OXACILLIN <=0.25 SENSITIVE Sensitive     TETRACYCLINE <=1 SENSITIVE Sensitive     VANCOMYCIN 1 SENSITIVE Sensitive     TRIMETH/SULFA <=10 SENSITIVE Sensitive     CLINDAMYCIN <=0.25 SENSITIVE Sensitive     RIFAMPIN <=0.5 SENSITIVE Sensitive     Inducible Clindamycin NEGATIVE Sensitive     * FEW STAPHYLOCOCCUS AUREUS  Culture, blood (routine x 2)     Status: None (Preliminary result)   Collection Time: 10/13/20 10:53 PM   Specimen: BLOOD  Result Value Ref Range Status   Specimen Description BLOOD RIGHT ANTECUBITAL  Final   Special Requests   Final    BOTTLES DRAWN AEROBIC ONLY Blood Culture results may not be optimal due to an inadequate volume of blood received in culture bottles   Culture   Final    NO GROWTH 3 DAYS Performed at [facility], [address] [place], [place] [address]    Report Status PENDING  Incomplete  Culture, blood (routine x 2)     Status: None (Preliminary result)   Collection Time: 10/13/20 10:53 PM   Specimen:  BLOOD  Result Value Ref Range Status   Specimen Description BLOOD RIGHT ANTECUBITAL  Final   Special Requests   Final    BOTTLES DRAWN AEROBIC ONLY Blood Culture results may not be optimal due to an inadequate volume of blood received in culture bottles   Culture   Final    NO GROWTH 3 DAYS Performed at [facility], [address], [place], [place] [address]    Report Status PENDING  Incomplete     Marcos Eke, NP [facility]  10/16/2020  8:38 AM

## 2020-10-16 NOTE — Discharge Summary (Signed)
Physician Discharge Summary  Keith Riggs:[id number] DOB: January 18, 1991 DOA: 10/13/2020  PCP: [name], PA-C  Admit date: 10/13/2020 Discharge date: 10/16/2020  Admitted From: home Disposition:  home  Recommendations for Outpatient Follow-up:  1. Follow up with PCP in 1-2 weeks 2. Please obtain BMP/CBC in one week 3. Please follow up with orthopedics, Dr. [name] later this week or early next week 4. Follow up with Infectious Disease 5. Follow up on pending HCV genotype and viral load     Home Health: No  Equipment/Devices: None   Discharge Condition: Stable  CODE STATUS: Full  Diet recommendation: Carb Modified      Discharge Diagnoses: Principal Problem:   Abscess of left hand Active Problems:   IVDU (intravenous drug user)   Nicotine dependence, cigarettes, uncomplicated   Uncontrolled type 2 diabetes mellitus with hyperglycemia, with long-term current use of insulin (HCC)   Hyponatremia   Abnormal liver enzymes   History of MRSA infection    Summary of HPI and Hospital Course:  30 year old male with past medical history of ongoing IVDU (heroin), nicotine dependence, depression, insulin-dependent type 2 diabetes, complicated MRSA infection in 2018 including sepsis, osteomyelitis, epidural abscess and inguinal abscesses.    Admitted to [facility] as transfer from [facility] due to left hand pain and swelling due to a large abscess.  Orthopedics took patient to the OR  on evening of admission and performed irrigation and debridement of the abscess in addition to extensor tenosynovectomy of the tendons of the 2nd 3rd and 4th dorsal compartments.  Patient was empirically covered with IV Vancomycin, given prior severe MRSA infections.  Patient's blood sugars remained very elevated in the hospital due to him drinking sugar sodas and non-compliance with carb control despite extensive counseling on important of glucose control for infection and wound healing.       ID was consulted.  Patient given ortivancin infusion day of d/c and is to start taking doxycycline on May 17th.  Ortho final recs: Left dorsal hand abscess s/p I&D, extensor tenosynovectomy on 5/7 Abscess cultures grew MSSA, gram stain GPCs.  Continue LUE elevation Pt received hydrotherapy with PT during admission for wound care and dressing changes.  Daily dressing changes at home.  OK to clean incision with gentle soap and water daily.  Ok also to clean incision daily with small amount of peroxide.   Follow up with Dr. [name] later this week or early next week.    Abscess of dorsal left hand -status post surgical I&D and extensor tenosynovectomy on 5/7. --Orthopedic surgery consulted --Treated with empiric IV vancomycin --Abscess cultures grew MSSA --ID consulted, gave IV oritavancin today --Pt to start oral Doxycycline on 5/17 --ID to arrange follow up as outpatient --Pain control as needed - continue scheduled Tylenol, short course oxycodone sent x 3 days.    Hyponatremia -presented with sodium 128 in the ED.  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 Sodium level normalized with IV hydration. --Off IV fluids and stable --BMP in 1-2 weeks  Transaminitis --incidental finding on labs with ALT higher than AST and approximal 2:1 ratio.  Unclear etiology suspect related to infection and/or drug/alcohol related. -- Hep C antibody positive.  Negative for hep A or B. 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  Covered for MRSA as above.   Insulin-dependent type 2 diabetes with hyperglycemia -uncontrolled with last A1c 14.3%. Pt drinking sodas in his room - CBG's in upper 400's as a result.   Pt was noncompliant with  prior home regimen.  Discharged with continuation of home 70/30 insuling BID in addition to Novolog (rx sent)   IV drug use -TOC consulted to provide patient with community resources to assist with quitting.   Nicotine dependence -counseled regarding the importance of cessation.  Nicotine patch ordered.   Discharge Instructions   Discharge Instructions    Call MD for:  extreme fatigue   Complete by: As directed    Call MD for:  persistant dizziness or light-headedness   Complete by: As directed    Call MD for:  redness, tenderness, or signs of infection (pain, swelling, redness, odor or green/yellow discharge around incision site)   Complete by: As directed    Call MD for:  severe uncontrolled pain   Complete by: As directed    Call MD for:  temperature >100.4   Complete by: As directed    Discharge instructions   Complete by: As directed    You were given an antibiotic infusion today that will cover your hand infection for one week. On May 17th, start taking Doxycycline - oral antibiotic, exactly as prescribed until gone.  Per Orthopedic surgeon, Dr. Roney Mansreighton -- Do daily dressing changes at home.  OK to clean incision with gentle soap and water daily.  Ok also to clean incision daily with small amount of peroxide.  Keep the extremity elevated.   Blood sugars - I highly recommend you pay very close attention to your blood sugars and use insulin to keep sugars controlled.  Bacteria feed on sugar, so the higher blood sugar, the harder it is for your body to fight off infection.   Wounds and incisions also do not heal properly with high blood sugars.  Your hand wound might not properly heal if sugars are not controlled, this can lead to recurrent infection and even needing amputation of the hand.  Uncontrolled diabetes puts you at high risk for infections of the extremities that end up needing amputation because wounds cannot heal, kidney failure and needing dialysis, blindness,  cardiovascular disease including heart attacks and strokes.  VERY IMPORTANT to control your diabetes.  AVOID SODAS and other sugary drinks or foods as much as possible.   Discharge wound care:   Complete by: As directed    As above, per ortho - daily dressing changes, clean with soap and water   Increase activity slowly   Complete by: As directed      Allergies as of 10/16/2020      Reactions   Bee Venom Anaphylaxis   Voltaren [diclofenac Sodium] Other (See Comments)   "Makes me feel not myself"   Latex Rash      Medication List    TAKE these medications   Acetaminophen Extra Strength 500 MG tablet Generic drug: acetaminophen Take 2 tablets (1,000 mg total) by mouth every 8 (eight) hours.   doxycycline 100 MG tablet Commonly known as: VIBRA-TABS Take 1 tablet (100 mg total) by mouth 2 (two) times daily for 14 days. Begin taking on 5/17. Stop taking on 5/31. Start taking on: Oct 23, 2020   folic acid 1 MG tablet Commonly known as: FOLVITE Take 1 tablet (1 mg total) by mouth daily. Start taking on: Oct 17, 2020   NovoLIN 70/30 FlexPen (70-30) 100 UNIT/ML  KwikPen Generic drug: insulin isophane & regular human Inject 25 Units into the skin in the morning and at bedtime.   NovoLOG FlexPen 100 UNIT/ML FlexPen Generic drug: insulin aspart Inject 10 Units into the skin 3 (three) times daily with meals. Hold if don't eat at least 1/2 meal   Oxycodone HCl 10 MG Tabs Take 1-1.5 tablets (10-15 mg total) by mouth every 4 (four) hours as needed for up to 3 days (10 mg for moderate, 15 mg for severe pain).   TRULICITY [place] Inject into the skin. Unknown dose            Discharge Care Instructions  (From admission, onward)         Start     Ordered   10/16/20 0000  Discharge wound care:       Comments: As above, per ortho - daily dressing changes, clean with soap and water   10/16/20 1206          Allergies  Allergen Reactions  . Bee Venom Anaphylaxis  . Voltaren  [Diclofenac Sodium] Other (See Comments)    "Makes me feel not myself"  . Latex Rash     If you experience worsening of your admission symptoms, develop shortness of breath, life threatening emergency, suicidal or homicidal thoughts you must seek medical attention immediately by calling 911 or calling your MD immediately  if symptoms less severe.    Please note   You were cared for by a hospitalist during your hospital stay. If you have any questions about your discharge medications or the care you received while you were in the hospital after you are discharged, you can call the unit and asked to speak with the hospitalist on call if the hospitalist that took care of you is not available. Once you are discharged, your primary care physician will handle any further medical issues. Please note that NO REFILLS for any discharge medications will be authorized once you are discharged, as it is imperative that you return to your primary care physician (or establish a relationship with a primary care physician if you do not have one) for your aftercare needs so that they can reassess your need for medications and monitor your lab values.   Consultations:  Orthopedic surgery - Dr. [name]  Infection Disease   Procedures/Studies: US Abdomen Limited RUQ (LIVER/GB)  Result Date: 10/14/2020 CLINICAL DATA:  30 year old male with abnormal liver enzymes. EXAM: ULTRASOUND ABDOMEN LIMITED RIGHT UPPER QUADRANT COMPARISON:  None. FINDINGS: Gallbladder: No gallstones or wall thickening visualized. No sonographic Murphy sign noted by sonographer. A 2 cm cystic structure adjacent to the gallbladder may represent a liver cyst. Common bile duct: Diameter: 6 mm Liver: There is slight irregularity of the liver contour suspicious for early cirrhosis. The liver is enlarged measuring at least 21 cm in length. Portal vein is patent on color Doppler imaging with normal direction of blood flow towards the liver. Other:  Trace subhepatic fluid. IMPRESSION: 1. Hepatomegaly with findings concerning for early cirrhosis. Clinical correlation is recommended. 2. Trace free fluid adjacent to the liver. Electronically Signed   By: Elgie Collard M.D.   On: 10/14/2020 00:28     Abscess I&D with tenosynovectomy   Subjective: Pt sleeping soundly and appears quite comfortable this AM.  No acute events reported.  Glucose in 3-400's bc pt drinks sodas in his room.  No fever/chills.  Says pain still not controlled.     Discharge Exam: Vitals:   10/16/20 0617 10/16/20 0700  BP: 120/85 124/73  Pulse: 85 72  Resp: 18 18  Temp: 97.8 F (36.6 C) 97.7 F (36.5 C)  SpO2: 100% 99%   Vitals:   10/15/20 1729 10/15/20 1949 10/16/20 0617 10/16/20 0700  BP: 129/80 123/80 120/85 124/73  Pulse: 91  85 72  Resp: 18 16 18 18   Temp: 98 F (36.7 C) 98.5 F (36.9 C) 97.8 F (36.6 C) 97.7 F (36.5 C)  TempSrc:  Oral Oral Oral  SpO2: 97% 99% 100% 99%  Weight:      Height:        General: sleeping soundly, wakes to voice briefly, not in acute distress Cardiovascular: RRR, S1/S2 +, no rubs, no gallops Respiratory: normal respiratory effort, on room air Abdominal: Soft, NT, ND, bowel sounds + Extremities: left hand with clean dry intact dressing and ACE wrap in place, no edema, no cyanosis    The results of significant diagnostics from this hospitalization (including imaging, microbiology, ancillary and laboratory) are listed below for reference.     Microbiology: Recent Results (from the past 240 hour(s))  Aerobic/Anaerobic Culture w Gram Stain (surgical/deep wound)     Status: None (Preliminary result)   Collection Time: 10/13/20  8:20 PM   Specimen: Abscess  Result Value Ref Range Status   Specimen Description ABSCESS LEFT HAND  Final   Special Requests SWAB SPEC A VANCO ANCEF  Final   Gram Stain   Final    ABUNDANT WBC PRESENT, PREDOMINANTLY PMN MODERATE GRAM POSITIVE COCCI IN CLUSTERS Performed at [facility], [address], [place], [place]    Culture   Final    FEW STAPHYLOCOCCUS AUREUS NO ANAEROBES ISOLATED; CULTURE IN PROGRESS FOR 5 DAYS    Report Status PENDING  Incomplete   Organism ID, Bacteria STAPHYLOCOCCUS AUREUS  Final      Susceptibility   Staphylococcus aureus - MIC*    CIPROFLOXACIN <=0.5 SENSITIVE Sensitive     ERYTHROMYCIN <=0.25 SENSITIVE Sensitive     GENTAMICIN <=0.5 SENSITIVE Sensitive     OXACILLIN <=0.25 SENSITIVE Sensitive     TETRACYCLINE <=1 SENSITIVE Sensitive     VANCOMYCIN 1 SENSITIVE Sensitive     TRIMETH/SULFA <=10 SENSITIVE Sensitive     CLINDAMYCIN <=0.25 SENSITIVE Sensitive     RIFAMPIN <=0.5 SENSITIVE Sensitive     Inducible Clindamycin NEGATIVE Sensitive     * FEW STAPHYLOCOCCUS AUREUS  Culture, blood (routine x 2)     Status: None (Preliminary result)   Collection Time: 10/13/20 10:53 PM   Specimen: BLOOD  Result Value Ref Range Status   Specimen Description BLOOD RIGHT ANTECUBITAL  Final   Special Requests   Final    BOTTLES DRAWN AEROBIC ONLY Blood Culture results may not be optimal due to an inadequate volume of blood received in culture bottles   Culture   Final    NO GROWTH 3 DAYS Performed at [facility], [address] [place], [place]    Report Status PENDING  Incomplete  Culture, blood (routine x 2)     Status: None (Preliminary result)   Collection Time: 10/13/20 10:53 PM   Specimen: BLOOD  Result Value Ref Range Status   Specimen Description BLOOD RIGHT ANTECUBITAL  Final   Special Requests   Final    BOTTLES DRAWN AEROBIC ONLY Blood Culture results may not be optimal due to an inadequate volume of blood received in culture bottles   Culture   Final    NO  GROWTH 3 DAYS Performed at [facility], [address] [place], [place] [address]    Report Status PENDING  Incomplete     Labs: BNP (last 3 results) No results for input(s): BNP in the last 8760 hours. Basic Metabolic  Panel: Recent Labs  Lab 10/13/20 2253 10/14/20 0209 10/15/20 0818  NA 134* 134* 135  K 3.8 3.7 4.0  CL 104 100 101  CO2 23 25 29   GLUCOSE 187* 199* 264*  BUN <5* <5* 7  CREATININE 0.40* 0.47* 0.52*  CALCIUM 8.1* 8.3* 8.7*  MG 1.6*  --   --   PHOS 3.0  --   --    Liver Function Tests: Recent Labs  Lab 10/13/20 2253 10/14/20 0209 10/15/20 0818  AST 82* 93* 120*  ALT 137* 137* 138*  ALKPHOS 127* 130* 147*  BILITOT 0.7 0.8 0.6  PROT 6.1* 6.2* 7.2  ALBUMIN 2.4* 2.4* 2.6*   No results for input(s): LIPASE, AMYLASE in the last 168 hours. No results for input(s): AMMONIA in the last 168 hours. CBC: Recent Labs  Lab 10/13/20 2253 10/14/20 0209 10/15/20 0818  WBC 12.1* 12.3* 9.9  HGB 13.1 14.1 15.2  HCT 38.5* 41.2 45.1  MCV 87.3 87.7 88.1  PLT 304 329 411*   Cardiac Enzymes: No results for input(s): CKTOTAL, CKMB, CKMBINDEX, TROPONINI in the last 168 hours. BNP: Invalid input(s): POCBNP CBG: Recent Labs  Lab 10/15/20 1130 10/15/20 1721 10/15/20 2107 10/16/20 0829 10/16/20 1142  GLUCAP 362* 349* 301* 427* 418*   D-Dimer No results for input(s): DDIMER in the last 72 hours. Hgb A1c Recent Labs    10/13/20 2253  HGBA1C 14.3*   Lipid Profile No results for input(s): CHOL, HDL, LDLCALC, TRIG, CHOLHDL, LDLDIRECT in the last 72 hours. Thyroid function studies No results for input(s): TSH, T4TOTAL, T3FREE, THYROIDAB in the last 72 hours.  Invalid input(s): FREET3 Anemia work up No results for input(s): VITAMINB12, FOLATE, FERRITIN, TIBC, IRON, RETICCTPCT in the last 72 hours. Urinalysis No results found for: COLORURINE, APPEARANCEUR, LABSPEC, PHURINE, GLUCOSEU, HGBUR, BILIRUBINUR, KETONESUR, PROTEINUR, UROBILINOGEN, NITRITE, LEUKOCYTESUR Sepsis Labs Invalid input(s): PROCALCITONIN,  WBC,  LACTICIDVEN Microbiology Recent Results (from the past 240 hour(s))  Aerobic/Anaerobic Culture w Gram Stain (surgical/deep wound)     Status: None (Preliminary result)    Collection Time: 10/13/20  8:20 PM   Specimen: Abscess  Result Value Ref Range Status   Specimen Description ABSCESS LEFT HAND  Final   Special Requests SWAB SPEC A VANCO ANCEF  Final   Gram Stain   Final    ABUNDANT WBC PRESENT, PREDOMINANTLY PMN MODERATE GRAM POSITIVE COCCI IN CLUSTERS Performed at [facility], [address] [place], [place]    Culture   Final    FEW STAPHYLOCOCCUS AUREUS NO ANAEROBES ISOLATED; CULTURE IN PROGRESS FOR 5 DAYS    Report Status PENDING  Incomplete   Organism ID, Bacteria STAPHYLOCOCCUS AUREUS  Final      Susceptibility   Staphylococcus aureus - MIC*    CIPROFLOXACIN <=0.5 SENSITIVE Sensitive     ERYTHROMYCIN <=0.25 SENSITIVE Sensitive     GENTAMICIN <=0.5 SENSITIVE Sensitive     OXACILLIN <=0.25 SENSITIVE Sensitive     TETRACYCLINE <=1 SENSITIVE Sensitive     VANCOMYCIN 1 SENSITIVE Sensitive     TRIMETH/SULFA <=10 SENSITIVE Sensitive     CLINDAMYCIN <=0.25 SENSITIVE Sensitive     RIFAMPIN <=0.5 SENSITIVE Sensitive     Inducible Clindamycin NEGATIVE Sensitive     *  FEW STAPHYLOCOCCUS AUREUS  Culture, blood (routine x 2)     Status: None (Preliminary result)   Collection Time: 10/13/20 10:53 PM   Specimen: BLOOD  Result Value Ref Range Status   Specimen Description BLOOD RIGHT ANTECUBITAL  Final   Special Requests   Final    BOTTLES DRAWN AEROBIC ONLY Blood Culture results may not be optimal due to an inadequate volume of blood received in culture bottles   Culture   Final    NO GROWTH 3 DAYS Performed at [facility], [address], [place], [place] [address]    Report Status PENDING  Incomplete  Culture, blood (routine x 2)     Status: None (Preliminary result)   Collection Time: 10/13/20 10:53 PM   Specimen: BLOOD  Result Value Ref Range Status   Specimen Description BLOOD RIGHT ANTECUBITAL  Final   Special Requests   Final    BOTTLES DRAWN AEROBIC ONLY Blood Culture results may not be optimal due to an  inadequate volume of blood received in culture bottles   Culture   Final    NO GROWTH 3 DAYS Performed at [facility], [address], [place], [place] [address]    Report Status PENDING  Incomplete     Time coordinating discharge: Over 30 minutes  SIGNED:   Pennie Banter, DO [facility] 10/16/2020, 2:50 PM   If 7PM-7AM, please contact night-coverage [email or web address]

## 2020-10-17 LAB — HCV RNA QUANT RFLX ULTRA OR GENOTYP

## 2020-10-17 LAB — HCV RNA (INTERNATIONAL UNITS)
HCV log10: 7.057 log10 IU/mL
Hcv Rna (International Units): 11400000 IU/mL

## 2020-10-17 LAB — HEPATITIS C GENOTYPE: Hepatitis C Genotype: 3

## 2020-10-18 ENCOUNTER — Telehealth: Payer: Self-pay

## 2020-10-18 LAB — CULTURE, BLOOD (ROUTINE X 2)
Culture: NO GROWTH
Culture: NO GROWTH

## 2020-10-18 LAB — AEROBIC/ANAEROBIC CULTURE W GRAM STAIN (SURGICAL/DEEP WOUND)

## 2020-10-18 NOTE — Telephone Encounter (Signed)
-----   Message from Kathlynn Grate, DO sent at 10/18/2020  8:01 AM EDT ----- Regarding: New Hep C patient Good morning,  Patient was recently hospitalized for hand abscess and also found to be HCV positive.  Can we try to get in touch with him and scheduled with any provider as a new hep C patient?  Thanks, Greig Castilla

## 2020-10-19 ENCOUNTER — Telehealth: Payer: Self-pay

## 2020-10-19 ENCOUNTER — Other Ambulatory Visit (HOSPITAL_COMMUNITY): Payer: Self-pay

## 2020-10-19 NOTE — Telephone Encounter (Signed)
[facility] Patient Product/process development scientist completed.    The patient is insured through [employer]. Medication will need a PA.   We will continue to follow to see if copay assistance is needed.  Clearance Coots, CPhT Specialty Pharmacy Patient [facility] for Infectious Disease Phone: [phone] Fax:  [phone]

## 2020-10-22 ENCOUNTER — Encounter: Payer: Medicaid Other | Admitting: Family

## 2021-08-07 DEATH — deceased

## 2022-07-30 ENCOUNTER — Other Ambulatory Visit: Payer: Self-pay
